# Patient Record
Sex: Male | Born: 1950 | Race: Black or African American | Hispanic: No | Marital: Married | Smoking: Never smoker
Health system: Southern US, Community
[De-identification: ages and names within clinical notes are randomized; demographics above are authoritative.]

## PROBLEM LIST (undated history)

## (undated) DIAGNOSIS — I219 Acute myocardial infarction, unspecified: Secondary | ICD-10-CM

## (undated) DIAGNOSIS — K219 Gastro-esophageal reflux disease without esophagitis: Secondary | ICD-10-CM

## (undated) DIAGNOSIS — I2510 Atherosclerotic heart disease of native coronary artery without angina pectoris: Secondary | ICD-10-CM

## (undated) HISTORY — DX: Gastro-esophageal reflux disease without esophagitis: K21.9

---

## 2006-06-28 ENCOUNTER — Emergency Department (HOSPITAL_COMMUNITY): Admission: EM | Admit: 2006-06-28 | Discharge: 2006-06-28 | Payer: Self-pay | Admitting: Family Medicine

## 2008-02-09 ENCOUNTER — Ambulatory Visit: Payer: Self-pay | Admitting: Internal Medicine

## 2008-02-09 ENCOUNTER — Inpatient Hospital Stay (HOSPITAL_COMMUNITY): Admission: EM | Admit: 2008-02-09 | Discharge: 2008-02-09 | Payer: Self-pay | Admitting: Emergency Medicine

## 2008-04-26 ENCOUNTER — Ambulatory Visit: Payer: Self-pay | Admitting: Internal Medicine

## 2008-04-26 DIAGNOSIS — K219 Gastro-esophageal reflux disease without esophagitis: Secondary | ICD-10-CM | POA: Insufficient documentation

## 2008-04-26 HISTORY — DX: Gastro-esophageal reflux disease without esophagitis: K21.9

## 2008-04-26 LAB — CONVERTED CEMR LAB: PSA: 0.99 ng/mL (ref 0.10–4.00)

## 2008-04-29 ENCOUNTER — Telehealth: Payer: Self-pay | Admitting: Internal Medicine

## 2008-05-05 ENCOUNTER — Telehealth: Payer: Self-pay | Admitting: Internal Medicine

## 2008-05-24 ENCOUNTER — Ambulatory Visit: Payer: Self-pay | Admitting: Gastroenterology

## 2008-06-02 ENCOUNTER — Ambulatory Visit: Payer: Self-pay | Admitting: Internal Medicine

## 2008-06-07 ENCOUNTER — Ambulatory Visit: Payer: Self-pay | Admitting: Gastroenterology

## 2010-11-15 ENCOUNTER — Encounter: Payer: Self-pay | Admitting: Internal Medicine

## 2010-11-16 ENCOUNTER — Ambulatory Visit (INDEPENDENT_AMBULATORY_CARE_PROVIDER_SITE_OTHER): Payer: Self-pay | Admitting: Internal Medicine

## 2010-11-16 ENCOUNTER — Encounter: Payer: Self-pay | Admitting: Internal Medicine

## 2010-11-16 VITALS — BP 128/80 | HR 68 | Temp 98.6°F | Resp 20 | Wt 212.0 lb

## 2010-11-16 DIAGNOSIS — R0602 Shortness of breath: Secondary | ICD-10-CM

## 2010-11-16 NOTE — Patient Instructions (Signed)
Limit your sodium (Salt) intake    It is important that you exercise regularly, at least 20 minutes 3 to 4 times per week.  If you develop chest pain or shortness of breath seek  medical attention.   Stress test as discussed   Return in 6 months for an annual exam

## 2010-11-16 NOTE — Progress Notes (Signed)
  Subjective:    Patient ID: Raymond Norris, male    DOB: Sep 17, 1950, 59 y.o.   MRN: [id number]  HPI   59 year old patient who is seen today with a chief complaint of dyspnea on exertion. This symptom has been present for approximately 6 months. He basically gets very short of breath with sexual activity. He states that otherwise he is very sedentary and this is the only time he experiences dyspnea. This has been present for 6 months but seems to be increasing he states he has sexual activity approximately once per month but has  avoided activity due to his shortness of breath. There is no exertional chest pain;  he has no striking her cardiovascular risk factors although his father's health is unknown   Review of Systems  Constitutional: Negative for fever, chills, appetite change and fatigue.  HENT: Negative for hearing loss, ear pain, congestion, sore throat, trouble swallowing, neck stiffness, dental problem, voice change and tinnitus.   Eyes: Negative for pain, discharge and visual disturbance.  Respiratory: Positive for shortness of breath. Negative for cough, chest tightness, wheezing and stridor.   Cardiovascular: Negative for chest pain, palpitations and leg swelling.  Gastrointestinal: Negative for nausea, vomiting, abdominal pain, diarrhea, constipation, blood in stool and abdominal distention.  Genitourinary: Negative for urgency, hematuria, flank pain, discharge, difficulty urinating and genital sores.  Musculoskeletal: Negative for myalgias, back pain, joint swelling, arthralgias and gait problem.  Skin: Negative for rash.  Neurological: Negative for dizziness, syncope, speech difficulty, weakness, numbness and headaches.  Hematological: Negative for adenopathy. Does not bruise/bleed easily.  Psychiatric/Behavioral: Negative for behavioral problems and dysphoric mood. The patient is not nervous/anxious.        Objective:   Physical Exam  Constitutional: He is oriented to person,  place, and time. He appears well-developed.  HENT:  Head: Normocephalic.  Right Ear: External ear normal.  Left Ear: External ear normal.  Eyes: Conjunctivae and EOM are normal.  Neck: Normal range of motion.  Cardiovascular: Normal rate, regular rhythm, normal heart sounds and intact distal pulses.   Pulmonary/Chest: Breath sounds normal.  Abdominal: Bowel sounds are normal.  Musculoskeletal: Normal range of motion. He exhibits no edema and no tenderness.  Neurological: He is alert and oriented to person, place, and time.  Psychiatric: He has a normal mood and affect. His behavior is normal.          Assessment & Plan:   progressive dyspnea on exertion. Rule out angina equivalent. We'll obtain a Cardiolite stress test for risk stratification. We'll schedule annual exam later this year

## 2010-11-30 ENCOUNTER — Ambulatory Visit (HOSPITAL_COMMUNITY): Payer: BC Managed Care – PPO | Attending: Internal Medicine | Admitting: Radiology

## 2010-11-30 VITALS — Ht 68.0 in | Wt 209.0 lb

## 2010-11-30 DIAGNOSIS — R0602 Shortness of breath: Secondary | ICD-10-CM | POA: Insufficient documentation

## 2010-11-30 DIAGNOSIS — R0989 Other specified symptoms and signs involving the circulatory and respiratory systems: Secondary | ICD-10-CM

## 2010-11-30 DIAGNOSIS — R0609 Other forms of dyspnea: Secondary | ICD-10-CM

## 2010-11-30 MED ORDER — TECHNETIUM TC 99M TETROFOSMIN IV KIT
11.0000 | PACK | Freq: Once | INTRAVENOUS | Status: AC | PRN
  Administered 2010-11-30: 11 via INTRAVENOUS

## 2010-11-30 MED ORDER — TECHNETIUM TC 99M TETROFOSMIN IV KIT
33.0000 | PACK | Freq: Once | INTRAVENOUS | Status: AC | PRN
  Administered 2010-11-30: 33 via INTRAVENOUS

## 2010-11-30 NOTE — Progress Notes (Signed)
[facility] SITE 3 NUCLEAR MED [address] [place] [address] [phone]  Cardiology Nuclear Med Study  Raymond Norris is a 60 y.o. male [id number] 03-Jan-1951   Nuclear Med Background Indication for Stress Test:  Evaluation for Ischemia History:  No documented CAD Cardiac Risk Factors: Family History - CAD, History of Smoking and Obesity  Symptoms:  DOE   Nuclear Pre-Procedure Caffeine/Decaff Intake:  None NPO After: 7:00pm   Lungs:  Clear IV 0.9% NS with Angio Cath:  20g  IV Site: R Antecubital  IV Started by:  Irean Hong, RN  Chest Size (in):  44 Cup Size: n/a  Height: 5\' 8"  (1.727 m)  Weight:  209 lb (94.802 kg)  BMI:  Body mass index is 31.78 kg/(m^2). Tech Comments:  N/A    Nuclear Med Study 1 or 2 day study: 1 day  Stress Test Type:  Stress  Reading MD: Charlton Haws, MD  Order Authorizing Provider:  Eleonore Chiquito, MD  Resting Radionuclide: Technetium 18m Tetrofosmin  Resting Radionuclide Dose: 11 mCi   Stress Radionuclide:  Technetium 63m Tetrofosmin  Stress Radionuclide Dose: 33 mCi           Stress Protocol Rest HR: 48 Stress HR: 142  Rest BP: 132/84 Stress BP: 247/89  Exercise Time (min): 6:15 METS: 7.2   Predicted Max HR: 160 bpm % Max HR: 88.75 bpm Rate Pressure Product: 62952   Dose of Adenosine (mg):  n/a Dose of Lexiscan: n/a mg  Dose of Atropine (mg): n/a Dose of Dobutamine: n/a mcg/kg/min (at max HR)  Stress Test Technologist: [employer], CMA-N  Nuclear Technologist:  Domenic Polite, CNMT     Rest Procedure:  Myocardial perfusion imaging was performed at rest 45 minutes following the intravenous administration of Technetium 52m Tetrofosmin. Rest ECG: Sinus brady with PVC's  Stress Procedure:  The patient exercised for 6:15 on the treadmill utilizing the Bruce protocol.  The patient stopped due to fatigue and denied any chest pain.  There were no diagnostic ST-T wave changes with exercise, but there were  nonspecific T-wave changes in late recovery.  He also had a hypertensive response to exercise, 247/89.  Technetium 46m Tetrofosmin was injected at peak exercise and myocardial perfusion imaging was performed after a brief delay. Stress ECG: No significant change from baseline ECG  QPS Raw Data Images:  Normal; no motion artifact; normal heart/lung ratio. Stress Images:  Normal homogeneous uptake in all areas of the myocardium. Rest Images:  Normal homogeneous uptake in all areas of the myocardium. Subtraction (SDS):  SDS 6 abnormal in the mid anterior wall Transient Ischemic Dilatation (Normal <1.22):  1.00 Lung/Heart Ratio (Normal <0.45):  0.30  Quantitative Gated Spect Images QGS EDV:  134 ml QGS ESV:  73 ml QGS cine images:  Decreased EF with mid and basal anterior wall hypokinesis QGS EF: 46%  Impression Exercise Capacity:  Fair exercise capacity. BP Response:  Normal blood pressure response. Clinical Symptoms:  There is dyspnea. ECG Impression:  No significant ST segment change suggestive of ischemia. Comparison with Prior Nuclear Study: No previous nuclear study performed  Overall Impression:  No ischemia or infarction.  EF depressed with RWMA in mid and basal anterior wall       Charlton Haws

## 2010-12-04 NOTE — Progress Notes (Signed)
ROUTED TO DR. Amador Cunas

## 2010-12-21 ENCOUNTER — Telehealth: Payer: Self-pay | Admitting: *Deleted

## 2010-12-21 NOTE — Telephone Encounter (Signed)
ERROR

## 2010-12-21 NOTE — Telephone Encounter (Addendum)
Wife is asking for lab results.???  LEFT message to call back.

## 2010-12-26 ENCOUNTER — Telehealth: Payer: Self-pay | Admitting: *Deleted

## 2010-12-26 NOTE — Telephone Encounter (Signed)
Spoke with pt - informed of test results. KIK

## 2010-12-26 NOTE — Telephone Encounter (Signed)
Please call and notify that his stress test was normal

## 2010-12-26 NOTE — Telephone Encounter (Signed)
Pt is asking for stress test results, and would like to know today.  Wife states he has been unable to get them?

## 2011-01-09 NOTE — H&P (Signed)
NAME:  Raymond Norris, Raymond Norris NO.:  [id number]   MEDICAL RECORD NO.:  [id number]          PATIENT TYPE:  INP   LOCATION:  3731                         FACILITY:  [facility]   PHYSICIAN:  Therisa Doyne, MD    DATE OF BIRTH:  23-Oct-1950   DATE OF ADMISSION:  02/08/2008  DATE OF DISCHARGE:                              HISTORY & PHYSICAL   PRIMARY CARE Joletta Manner:  Elsworth Soho, MD   CHIEF COMPLAINT:  Nausea and bloating, rule out myocardial infarction.   HISTORY OF PRESENT ILLNESS:  This is a 60 year old African American male  with no significant past medical history and no cardiovascular risk  factors, who presents to the emergency department with 2 days of  bloating, nausea, and indigestion.  These symptoms have been constant  for the past 2 days.  He has tried Gas-X, which has not relieved the  symptoms.  His symptoms are not worsened with exertion.  He denies any  pain.  He does report decreased oral intake over the past 2 days and has  never had symptoms like this before.  He took 2 aspirin this evening  because he was concerned that this could be related to his heart and  came into the emergency department for evaluation.   The patient denies chest pain, headaches, fevers, or chills.  He did  have one episode of shortness of breath, which lasted less than 10  minutes, this occurred at rest.   PAST MEDICAL HISTORY:  None.   SOCIAL HISTORY:  The patient lives in [place].  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  He denies tobacco, alcohol, or drugs.   FAMILY HISTORY:  There is no history of early coronary artery disease.   ALLERGIES:  No known drug allergies.   MEDICATIONS:  None.   REVIEW OF SYSTEMS:  All systems were reviewed and are negative except as  mentioned above in his present illness.   PHYSICAL EXAMINATION:  VITAL SIGNS:  Temperature 98.2, blood pressure  151/92 with a repeat blood pressure of 124/84, pulse 86, respirations  23, and oxygen saturating  97% on room air.  GENERAL:  In no acute distress.  HEENT:  Normocephalic and atraumatic.  The pupils are equal, round, and  reactive to light and accommodation.  Extraocular movements were intact.  Oropharynx reveals moist mucous membranes.  NECK:  Supple.  No lymphadenopathy.  No jugular venous distention or  masses.  CARDIOVASCULAR:  Regular rate and rhythm.  No murmurs, rubs, or gallops.  CHEST:  Clear to auscultation bilaterally.  ABDOMEN:  Positive bowel sounds.  Soft, nontender, and nondistended.  EXTREMITIES: No clubbing, cyanosis, or edema.  BACK:  No CVA tenderness.  SKIN:  No rashes.   LABORATORY DATA:  White blood cell count 10.5, hemoglobin 15.2, and  platelets 346.  Sodium 139, potassium 4.8, chloride 109, and bicarb 20.  BUN 18, creatinine 1.2, and glucose 104.  Total bilirubin elevated at  1.9, alkaline phosphatase 39, AST 23, ALT 15, and lipase 26.   STUDIES:  Chest x-ray showed no acute cardiopulmonary disease.  EKG:  1. EKG from 1224 showed sinus bradycardia with nonspecific ST-T wave      changes in the lateral leads.  2. EKG #2 from 2:29 a.m. again showed sinus bradycardia at the rate of      53 beats per minute, normal intervals, and normal axis.  There are      nonspecific ST-T wave changes, specifically there is less than 1 mm      of ST-segment elevation in leads I and aVL.  There continues to be      nonspecific ST-T wave changes in the lateral leads.  3. EKG #3 from 2:55 a.m. showed sinus bradycardia at 54 beats per      minute with nonspecific ST-T wave changes.   ASSESSMENT AND PLAN:  This is a 60 year old African American male with  no significant past medical history and no cardiovascular risk factors,  who presents with 2 days of bloating and nausea with negative cardiac  enzymes, nonspecific EKG changes, and an elevated total bilirubin of  1.9.   1. We will admit the patient to [facility] to a telemetry unit.   1. Nausea and bloating.   At this time, my suspicion for acute coronary      syndrome is low.  His symptoms are somewhat atypical and have been      constant for 2 days and he has negative cardiac biomarkers.  His      EKG does have some nonspecific ST-T wave changes, and on his second      EKG, there is some mild ST-segment elevation in lead I and aVL;      however, this is less than 1 mm and the patient is currently      asymptomatic at the time of this EKG.  We will rule the patient out      for myocardial infarction with serial cardiac enzymes and monitor      him on telemetry unit.  We will place the patient on aspirin 325 mg      daily.  We will not use a beta-blocker as he is currently      bradycardic.  We will hold off on anticoagulation or 2b/3a      inhibitor unless the patient rules in for myocardial infarction.      Assuming that he rules out, it would be prudent to obtain an      ischemia evaluation prior to discharge.  We will check fasting      lipid profile for risk factor modification.  The etiology of his      nausea and bloating could be related to gallbladder disease given      his hyperbilirubinemia.  We will check a right upper quadrant      ultrasound in the morning to rule out cholelithiasis.  This      evening, we will check a KUB to make sure there are no signs of      obstruction.  Symptomatic treatment with Maalox and GI cocktail as      needed.   1. Fluids, electrolytes, and nutrition:  Normal saline at 75 mL per      hour.  Electrolytes were stable, NPO.   1. Deep venous thrombosis prophylaxis with Lovenox.      Therisa Doyne, MD  Electronically Signed     SJT/MEDQ  D:  02/09/2008  T:  02/09/2008  Job:  [id number]

## 2011-05-14 ENCOUNTER — Other Ambulatory Visit (INDEPENDENT_AMBULATORY_CARE_PROVIDER_SITE_OTHER): Payer: BC Managed Care – PPO

## 2011-05-14 DIAGNOSIS — Z0000 Encounter for general adult medical examination without abnormal findings: Secondary | ICD-10-CM

## 2011-05-14 LAB — CBC WITH DIFFERENTIAL/PLATELET
Basophils Relative: 0.4 % (ref 0.0–3.0)
Eosinophils Absolute: 0.1 10*3/uL (ref 0.0–0.7)
HCT: 47.4 % (ref 39.0–52.0)
Lymphs Abs: 3.1 10*3/uL (ref 0.7–4.0)
MCHC: 32.7 g/dL (ref 30.0–36.0)
MCV: 95.2 fl (ref 78.0–100.0)
Monocytes Absolute: 0.5 10*3/uL (ref 0.1–1.0)
Neutrophils Relative %: 50.2 % (ref 43.0–77.0)
RBC: 4.97 Mil/uL (ref 4.22–5.81)

## 2011-05-14 LAB — POCT URINALYSIS DIPSTICK
Bilirubin, UA: NEGATIVE
Glucose, UA: NEGATIVE
Ketones, UA: NEGATIVE
Nitrite, UA: NEGATIVE
Spec Grav, UA: 1.025
pH, UA: 6

## 2011-05-14 LAB — LIPID PANEL
LDL Cholesterol: 118 mg/dL — ABNORMAL HIGH (ref 0–99)
Total CHOL/HDL Ratio: 4

## 2011-05-14 LAB — BASIC METABOLIC PANEL
BUN: 17 mg/dL (ref 6–23)
CO2: 26 mEq/L (ref 19–32)
Chloride: 109 mEq/L (ref 96–112)
Creatinine, Ser: 1 mg/dL (ref 0.4–1.5)
Potassium: 4.3 mEq/L (ref 3.5–5.1)

## 2011-05-14 LAB — HEPATIC FUNCTION PANEL
Albumin: 4.1 g/dL (ref 3.5–5.2)
Alkaline Phosphatase: 41 U/L (ref 39–117)
Bilirubin, Direct: 0.1 mg/dL (ref 0.0–0.3)

## 2011-05-14 LAB — PSA: PSA: 0.83 ng/mL (ref 0.10–4.00)

## 2011-05-21 ENCOUNTER — Ambulatory Visit (INDEPENDENT_AMBULATORY_CARE_PROVIDER_SITE_OTHER): Payer: BC Managed Care – PPO | Admitting: Internal Medicine

## 2011-05-21 ENCOUNTER — Encounter: Payer: Self-pay | Admitting: Internal Medicine

## 2011-05-21 VITALS — BP 120/80 | HR 70 | Temp 98.2°F | Resp 16 | Ht 68.5 in | Wt 217.0 lb

## 2011-05-21 DIAGNOSIS — Z0000 Encounter for general adult medical examination without abnormal findings: Secondary | ICD-10-CM

## 2011-05-21 DIAGNOSIS — Z23 Encounter for immunization: Secondary | ICD-10-CM

## 2011-05-21 NOTE — Progress Notes (Signed)
Subjective:    Patient ID: Raymond Norris, male    DOB: 07-Dec-1950, 60 y.o.   MRN: [id number]  HPI  60 year old patient who is seen today for a preventive health examination he takes no chronic medications. He was evaluated in the spring with a chief complaint of dyspnea on exertion. He had a Engineer, production medicine stress test that revealed a hypertensive blood pressure response and very mild LV dysfunction. No ischemic changes were noted. He still has mild DOE but seems to be much less of a problem. No new concerns or complaints. He has had a colonoscopy in 2009.   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  HENT: Negative for hearing loss, ear pain, congestion, rhinorrhea, sneezing, mouth sores, trouble swallowing, neck pain, neck stiffness, dental problem, voice change, sinus pressure and tinnitus.   Eyes: Negative for photophobia, pain, redness and visual disturbance.  Respiratory: Negative for apnea, cough, choking, chest tightness, shortness of breath and wheezing.   Cardiovascular: Negative for chest pain, palpitations and leg swelling.  Gastrointestinal: Negative for nausea, vomiting, abdominal pain, diarrhea, constipation, blood in stool, abdominal distention, anal bleeding and rectal pain.  Genitourinary: Negative for dysuria, urgency, frequency, hematuria, flank pain,  decreased urine volume, discharge, penile swelling, scrotal swelling, difficulty urinating, genital sores and testicular pain.  Musculoskeletal: Negative for myalgias, back pain, joint swelling, arthralgias and gait problem.  Skin: Negative for color change, rash and wound.  Neurological: Negative for dizziness, tremors, seizures, syncope, facial asymmetry, speech difficulty, weakness, light-headedness, numbness and headaches.  Hematological: Negative for adenopathy. Does not bruise/bleed easily.  Psychiatric/Behavioral: Negative for suicidal ideas, hallucinations, behavioral problems, confusion, sleep disturbance, self-injury, dysphoric mood, decreased concentration and agitation. The patient is not nervous/anxious.        Objective:   Physical Exam  Constitutional: He appears well-developed and well-nourished. No distress.       130/84 Weight 217  HENT:  Head: Normocephalic and atraumatic.  Right Ear: External ear normal.  Left Ear: External ear normal.  Nose: Nose normal.  Mouth/Throat: Oropharynx is clear and moist.  Eyes: Conjunctivae and EOM are normal. Pupils are equal, round, and reactive to light. No scleral icterus.  Neck: Normal range of motion. Neck supple. No JVD present. No thyromegaly present.  Cardiovascular: Regular rhythm, normal heart sounds and intact distal pulses.  Exam reveals no gallop and no friction rub.   No murmur heard. Pulmonary/Chest: Effort normal and breath sounds normal. He exhibits no tenderness.  Abdominal: Soft. Bowel sounds are normal. He exhibits no distension and no mass. There is no tenderness.  Genitourinary: Prostate normal and penis normal.  Musculoskeletal: Normal range of motion. He exhibits no edema and no tenderness.  Lymphadenopathy:    He has no cervical adenopathy.  Neurological: He is alert. He has normal reflexes. No cranial nerve deficit. Coordination normal.  Skin: Skin is warm and  dry. No rash noted.  Psychiatric: He has a normal  mood and affect. His behavior is normal.          Assessment & Plan:   Preventive health examination. A low salt low calorie diet encouraged regular exercise modest weight loss recommended. He return in one year for followup. Laboratory studies were reviewed. These were unremarkable.

## 2011-05-21 NOTE — Patient Instructions (Signed)
Limit your sodium (Salt) intake    It is important that you exercise regularly, at least 20 minutes 3 to 4 times per week.  If you develop chest pain or shortness of breath seek  medical attention.  Return in one year for follow-up   

## 2011-05-24 LAB — CARDIAC PANEL(CRET KIN+CKTOT+MB+TROPI)
Relative Index: 2.3
Relative Index: 2.3
Total CK: 105
Troponin I: 0.01
Troponin I: <0.01

## 2011-05-24 LAB — COMPREHENSIVE METABOLIC PANEL
ALT: 15
BUN: 18
CO2: 22
Calcium: 9
Creatinine, Ser: 1.24
GFR calc non Af Amer: >60
Glucose, Bld: 104 — ABNORMAL HIGH
Total Protein: 6.7

## 2011-05-24 LAB — TSH: TSH: 1.121

## 2011-05-24 LAB — CBC
HCT: 43.8
Hemoglobin: 15.2
MCHC: 34.8
MCV: 91
RBC: 4.81
RDW: 14.4

## 2011-05-24 LAB — LIPID PANEL
HDL: 27 — ABNORMAL LOW
Triglycerides: 64
VLDL: 13

## 2011-05-24 LAB — POCT CARDIAC MARKERS
CKMB, poc: <1 — ABNORMAL LOW
Operator id: 277751
Troponin i, poc: <0.05

## 2011-05-24 LAB — LIPASE, BLOOD: Lipase: 26

## 2012-05-14 ENCOUNTER — Other Ambulatory Visit: Payer: BC Managed Care – PPO

## 2012-05-21 ENCOUNTER — Encounter: Payer: BC Managed Care – PPO | Admitting: Internal Medicine

## 2012-05-21 ENCOUNTER — Telehealth: Payer: Self-pay

## 2012-05-21 ENCOUNTER — Encounter: Payer: Self-pay | Admitting: Internal Medicine

## 2012-05-21 NOTE — Telephone Encounter (Signed)
Attempt to call for NCNS cpx scheduled 1 yr ago - tried number twice - "the number you have called in incorrect - check the number and try again"

## 2013-03-13 ENCOUNTER — Encounter: Payer: Self-pay | Admitting: *Deleted

## 2013-03-27 ENCOUNTER — Encounter: Payer: Self-pay | Admitting: Gastroenterology

## 2014-07-06 ENCOUNTER — Telehealth: Payer: Self-pay | Admitting: Internal Medicine

## 2014-07-06 NOTE — Telephone Encounter (Signed)
lmom for wife to cb °

## 2014-07-06 NOTE — Telephone Encounter (Signed)
Pt was last seen 2012. Pt would like to re-est. Can I sch?

## 2014-07-06 NOTE — Telephone Encounter (Signed)
ok 

## 2014-07-07 NOTE — Telephone Encounter (Signed)
Pt has been sch

## 2014-07-09 ENCOUNTER — Encounter (HOSPITAL_COMMUNITY): Payer: Self-pay | Admitting: *Deleted

## 2014-07-09 ENCOUNTER — Emergency Department (INDEPENDENT_AMBULATORY_CARE_PROVIDER_SITE_OTHER)
Admission: EM | Admit: 2014-07-09 | Discharge: 2014-07-09 | Disposition: A | Discharge status: Home / Self Care | Payer: Self-pay | Source: Home / Self Care | Attending: Emergency Medicine | Admitting: Emergency Medicine

## 2014-07-09 DIAGNOSIS — R42 Dizziness and giddiness: Secondary | ICD-10-CM

## 2014-07-09 NOTE — Discharge Instructions (Signed)
Dizziness °Dizziness is a common problem. It is a feeling of unsteadiness or light-headedness. You may feel like you are about to faint. Dizziness can lead to injury if you stumble or fall. A person of any age group can suffer from dizziness, but dizziness is more common in older adults. °CAUSES  °Dizziness can be caused by many different things, including: °· Middle ear problems. °· Standing for too long. °· Infections. °· An allergic reaction. °· Aging. °· An emotional response to something, such as the sight of blood. °· Side effects of medicines. °· Tiredness. °· Problems with circulation or blood pressure. °· Excessive use of alcohol or medicines, or illegal drug use. °· Breathing too fast (hyperventilation). °· An irregular heart rhythm (arrhythmia). °· A low red blood cell count (anemia). °· Pregnancy. °· Vomiting, diarrhea, fever, or other illnesses that cause body fluid loss (dehydration). °· Diseases or conditions such as Parkinson's disease, high blood pressure (hypertension), diabetes, and thyroid problems. °· Exposure to extreme heat. °DIAGNOSIS  °Your health care provider will ask about your symptoms, perform a physical exam, and perform an electrocardiogram (ECG) to record the electrical activity of your heart. Your health care provider may also perform other heart or blood tests to determine the cause of your dizziness. These may include: °· Transthoracic echocardiogram (TTE). During echocardiography, sound waves are used to evaluate how blood flows through your heart. °· Transesophageal echocardiogram (TEE). °· Cardiac monitoring. This allows your health care provider to monitor your heart rate and rhythm in real time. °· Holter monitor. This is a portable device that records your heartbeat and can help diagnose heart arrhythmias. It allows your health care provider to track your heart activity for several days if needed. °· Stress tests by exercise or by giving medicine that makes the heart beat  faster. °TREATMENT  °Treatment of dizziness depends on the cause of your symptoms and can vary greatly. °HOME CARE INSTRUCTIONS  °· Drink enough fluids to keep your urine clear or pale yellow. This is especially important in very hot weather. In older adults, it is also important in cold weather. °· Take your medicine exactly as directed if your dizziness is caused by medicines. When taking blood pressure medicines, it is especially important to get up slowly. °· Rise slowly from chairs and steady yourself until you feel okay. °· In the morning, first sit up on the side of the bed. When you feel okay, stand slowly while holding onto something until you know your balance is fine. °· Move your legs often if you need to stand in one place for a long time. Tighten and relax your muscles in your legs while standing. °· Have someone stay with you for 1-2 days if dizziness continues to be a problem. Do this until you feel you are well enough to stay alone. Have the person call your health care provider if he or she notices changes in you that are concerning. °· Do not drive or use heavy machinery if you feel dizzy. °· Do not drink alcohol. °SEEK IMMEDIATE MEDICAL CARE IF:  °· Your dizziness or light-headedness gets worse. °· You feel nauseous or vomit. °· You have problems talking, walking, or using your arms, hands, or legs. °· You feel weak. °· You are not thinking clearly or you have trouble forming sentences. It may take a friend or family member to notice this. °· You have chest pain, abdominal pain, shortness of breath, or sweating. °· Your vision changes. °· You notice   any bleeding.  You have side effects from medicine that seems to be getting worse rather than better. MAKE SURE YOU:   Understand these instructions.  Will watch your condition.  Will get help right away if you are not doing well or get worse. Document Released: 02/06/2001 Document Revised: 08/18/2013 Document Reviewed: 03/02/2011 [facility]  Patient Information 2015 [place], [place]. This information is not intended to replace advice given to you by your health care provider. Make sure you discuss any questions you have with your health care provider.  Near-Syncope Near-syncope (commonly known as near fainting) is sudden weakness, dizziness, or feeling like you might pass out. This can happen when getting up or while standing for a long time. It is caused by a sudden decrease in blood flow to the brain, which can occur for various reasons. Most of the reasons are not serious.  HOME CARE Watch your condition for any changes.  Have someone stay with you until you feel stable.  If you feel like you are going to pass out:  Lie down right away.  Prop your feet up if you can.  Breathe deeply and steadily.  Move only when the feeling has gone away. Most of the time, this feeling lasts only a few minutes. You may feel tired for several hours.  Drink enough fluids to keep your pee (urine) clear or pale yellow.  If you are taking blood pressure or heart medicine, stand up slowly.  Follow up with your doctor as told. GET HELP RIGHT AWAY IF:   You have a severe headache.  You have unusual pain in the chest, belly (abdomen), or back.  You have bleeding from the mouth or butt (rectum), or you have black or tarry poop (stool).  You feel your heart beat differently than normal, or you have a very fast pulse.  You pass out, or you twitch and shake when you pass out.  You pass out when sitting or lying down.  You feel confused.  You have trouble walking.  You are weak.  You have vision problems. MAKE SURE YOU:   Understand these instructions.  Will watch your condition.  Will get help right away if you are not doing well or get worse. Document Released: 01/30/2008 Document Revised: 08/18/2013 Document Reviewed: 01/16/2013 [facility] Patient Information 2015 [place]Care, [place]. This information is not intended to replace  advice given to you by your health care provider. Make sure you discuss any questions you have with your health care provider.

## 2014-07-09 NOTE — ED Provider Notes (Signed)
CSN: [id number]     Arrival date & time 07/09/14  1635 History   First MD Initiated Contact with Patient 07/09/14 1641     Chief Complaint  Patient presents with  . Dizziness   (Consider location/radiation/quality/duration/timing/severity/associated sxs/prior Treatment) HPI Comments: 63 year old male states that 5 days ago he was lying on a creeper (resolved) while working under a car. He lifted his head up and experienced dizziness. After he stood up he felt lightheaded associated with dizziness and then symptoms spontaneously resolved. The whole episode lasted 3-4 minutes. He has been well without similar episodes until today. He was lying on the same creeper in the same position and again while lifting his head he experienced dizziness associated with minor transient headache and nausea. Upon standing up his symptoms abated. He continued to work through the day. And on arrival he is asymptomatic.he denies having associated symptoms with turning of the head, other movements such as bending over. Denies having symptoms while supine during the night and sleeping nor does he have symptoms upon awakening and standing. He has no cardiopulmonary or neurologic diagnoses/past medical history. Is not taking any medications. 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 Other review of systems are negative.   Past Medical History  Diagnosis Date  . GERD 04/26/2008   History reviewed. No pertinent past surgical history. History reviewed. No pertinent family history. History  Substance Use Topics  . Smoking status: Never Smoker   . Smokeless tobacco: Never Used  . Alcohol Use: No    Review of Systems  Constitutional: Negative for fever, chills, diaphoresis, activity change and appetite change.  HENT: Negative.        No hx of head or neck trauma.   Eyes: Negative for pain and visual disturbance.  Respiratory: Negative for cough, choking, chest tightness, shortness of breath and wheezing.    Cardiovascular: Negative for chest pain, palpitations and leg swelling.  Gastrointestinal: Positive for nausea.       Minor brief nausea associated with dizziness  Genitourinary: Negative.   Musculoskeletal: Negative for myalgias, back pain, joint swelling, arthralgias, gait problem, neck pain and neck stiffness.  Skin: Negative.   Neurological: Positive for dizziness. Negative for tremors, seizures, syncope, facial asymmetry, speech difficulty, weakness, numbness and headaches.       No vertigo  Psychiatric/Behavioral: Negative for behavioral problems, dysphoric mood, decreased concentration and agitation. The patient is not nervous/anxious.     Allergies  Review of patient's allergies indicates no known allergies.  Home Medications   Prior to Admission medications   Not on File   BP 135/92 mmHg  Pulse 81  Temp(Src) 98.8 F (37.1 C) (Oral)  Resp 16  SpO2 97% Physical Exam  Constitutional: He is oriented to person, place, and time. He appears well-developed and well-nourished. No distress.  HENT:  Head: Normocephalic and atraumatic.  Right Ear: External ear normal.  Left Ear: External ear normal.  Nose: Nose normal.  Mouth/Throat: Oropharynx is clear and moist. No oropharyngeal exudate.  TM's nl . No hemotympanum or effusion. Soft palate symmetrically. Tongue is midline. Uvula midline. No intraoral swelling or lesions.  Eyes: Conjunctivae and EOM are normal. Pupils are equal, round, and reactive to light. Right eye exhibits no discharge. Left eye exhibits no discharge. No scleral icterus.  Neck: Normal range of motion. Neck supple. No JVD present. No thyromegaly present.  No carotid bruits  Cardiovascular: Normal rate, regular rhythm, normal heart sounds and intact distal pulses.   No  murmur heard. Pulmonary/Chest: Effort normal and breath sounds normal. No respiratory distress. He has no wheezes. He has no rales.  Abdominal: Soft. There is no tenderness.  Musculoskeletal:  Normal range of motion. He exhibits no edema or tenderness.  Lymphadenopathy:    He has no cervical adenopathy.  Neurological: He is alert and oriented to person, place, and time. He has normal reflexes. He displays no tremor. No cranial nerve deficit or sensory deficit. He exhibits normal muscle tone. He displays a negative Romberg sign. He displays no seizure activity. Coordination and gait normal. GCS eye subscore is 4. GCS verbal subscore is 5. GCS motor subscore is 6.  Heel to toe nl   Skin: Skin is warm and dry.  Psychiatric: He has a normal mood and affect.  Nursing note and vitals reviewed.   ED Course  Procedures (including critical care time) Labs Review Labs Reviewed - No data to display  Imaging Review No results found. EKG: NSR. No ectopy, non specific changes T-wave inversion III and early repol in V2 and V3. Nl axis.  MDM   1. Dizziness, nonspecific    Unclear etiology, most likely inner ear. May need further work up to include carotid dopplers. Asymptomatic on arrival and in the Urgent Care. Stable. St feels well. Discussed fed flags and associated sx's and to go to the ED for problems or worsening. F/U with PCP    Hayden Rasmussenavid Kristin Lamagna, NP 07/09/14 1753

## 2014-07-09 NOTE — ED Notes (Signed)
Pt  Reports   Had   Some   dizzyness      And mild  Headache  Earlier  In  The  Week   Bette  Today    -  The  Patient is  Awake  And  Alert  And  Oriented  At   This  Time  -     He  denys  Any  Vomiting      He  Drove  Himself   To  The  Clinic     He is  Speaking in  Complete  sentances  Ambulates  With a  Steady  Fluid  Gait     denys   Any pain at this time    And  His    Skin  Is  Warm  And  Dry

## 2014-07-13 ENCOUNTER — Ambulatory Visit: Payer: Self-pay | Admitting: Internal Medicine

## 2014-07-16 ENCOUNTER — Ambulatory Visit (INDEPENDENT_AMBULATORY_CARE_PROVIDER_SITE_OTHER): Payer: Self-pay | Admitting: Internal Medicine

## 2014-07-16 ENCOUNTER — Encounter: Payer: Self-pay | Admitting: Internal Medicine

## 2014-07-16 VITALS — BP 110/64 | HR 58 | Temp 99.0°F | Wt 218.0 lb

## 2014-07-16 DIAGNOSIS — K219 Gastro-esophageal reflux disease without esophagitis: Secondary | ICD-10-CM

## 2014-07-16 DIAGNOSIS — R42 Dizziness and giddiness: Secondary | ICD-10-CM

## 2014-07-16 NOTE — Progress Notes (Signed)
   Subjective:    Patient ID: Raymond Norris, male    DOB: 02/07/1951, 63 y.o.   MRN: [id number]  HPI  63 year old patient who is seen in follow-up.  He was seen in the ED about 1 week ago with dizziness.  He describes dizziness with head turning, especially when he was on his back and in awkward positions while working on his car.  Symptoms are very mild and self-limiting.  Denies any true vertigo or nausea.  He takes no medications.  He is scheduled for a physical next month  Past Medical History  Diagnosis Date  . GERD 04/26/2008    History   Social History  . Marital Status: Married    Spouse Name: N/A    Number of Children: N/A  . Years of Education: N/A   Occupational History  . Not on file.   Social History Main Topics  . Smoking status: Never Smoker   . Smokeless tobacco: Never Used  . Alcohol Use: No  . Drug Use: No  . Sexual Activity: Not on file   Other Topics Concern  . Not on file   Social History Narrative    No past surgical history on file.  No family history on file.  No Known Allergies  No current outpatient prescriptions on file prior to visit.   No current facility-administered medications on file prior to visit.    BP 110/64 mmHg  Pulse 58  Temp(Src) 99 F (37.2 C) (Oral)  Wt 218 lb (98.884 kg)     Review of Systems  Constitutional: Negative for fever, chills, appetite change and fatigue.  HENT: Negative for congestion, dental problem, ear pain, hearing loss, sore throat, tinnitus, trouble swallowing and voice change.   Eyes: Negative for pain, discharge and visual disturbance.  Respiratory: Negative for cough, chest tightness, wheezing and stridor.   Cardiovascular: Negative for chest pain, palpitations and leg swelling.  Gastrointestinal: Negative for nausea, vomiting, abdominal pain, diarrhea, constipation, blood in stool and abdominal distention.  Genitourinary: Negative for urgency, hematuria, flank pain, discharge, difficulty  urinating and genital sores.  Musculoskeletal: Negative for myalgias, back pain, joint swelling, arthralgias, gait problem and neck stiffness.  Skin: Negative for rash.  Neurological: Positive for dizziness and light-headedness. Negative for syncope, speech difficulty, weakness, numbness and headaches.  Hematological: Negative for adenopathy. Does not bruise/bleed easily.  Psychiatric/Behavioral: Negative for behavioral problems and dysphoric mood. The patient is not nervous/anxious.        Objective:   Physical Exam  Constitutional: He is oriented to person, place, and time. He appears well-developed.  Blood pressure 110/64 on arrival Repeat blood pressure 130/80  HENT:  Head: Normocephalic.  Right Ear: External ear normal.  Left Ear: External ear normal.  Eyes: Conjunctivae and EOM are normal.  Neck: Normal range of motion.  Cardiovascular: Normal rate and normal heart sounds.   Pulmonary/Chest: Breath sounds normal.  Abdominal: Bowel sounds are normal.  Musculoskeletal: Normal range of motion. He exhibits no edema or tenderness.  Neurological: He is alert and oriented to person, place, and time. No cranial nerve deficit. Coordination normal.  No drift Normal finger to nose testing  Psychiatric: He has a normal mood and affect. His behavior is normal.          Assessment & Plan:   Nonspecific dizziness.  Probable mild vertigo symptoms are mild and seem to be improving.  He is scheduled for a annual exam in 1 month.  We'll reassess at that time

## 2014-07-16 NOTE — Progress Notes (Signed)
Pre visit review using our clinic review tool, if applicable. No additional management support is needed unless otherwise documented below in the visit note. 

## 2014-07-16 NOTE — Patient Instructions (Signed)
Return for your annual exam next month as scheduled  Call or return to clinic prn if these symptoms worsen or fail to improve as anticipated.  Vertigo Vertigo means you feel like you or your surroundings are moving when they are not. Vertigo can be dangerous if it occurs when you are at work, driving, or performing difficult activities.  CAUSES  Vertigo occurs when there is a conflict of signals sent to your brain from the visual and sensory systems in your body. There are many different causes of vertigo, including:  Infections, especially in the inner ear.  A bad reaction to a drug or misuse of alcohol and medicines.  Withdrawal from drugs or alcohol.  Rapidly changing positions, such as lying down or rolling over in bed.  A migraine headache.  Decreased blood flow to the brain.  Increased pressure in the brain from a head injury, infection, tumor, or bleeding. SYMPTOMS  You may feel as though the world is spinning around or you are falling to the ground. Because your balance is upset, vertigo can cause nausea and vomiting. You may have involuntary eye movements (nystagmus). DIAGNOSIS  Vertigo is usually diagnosed by physical exam. If the cause of your vertigo is unknown, your caregiver may perform imaging tests, such as an MRI scan (magnetic resonance imaging). TREATMENT  Most cases of vertigo resolve on their own, without treatment. Depending on the cause, your caregiver may prescribe certain medicines. If your vertigo is related to body position issues, your caregiver may recommend movements or procedures to correct the problem. In rare cases, if your vertigo is caused by certain inner ear problems, you may need surgery. HOME CARE INSTRUCTIONS   Follow your caregiver's instructions.  Avoid driving.  Avoid operating heavy machinery.  Avoid performing any tasks that would be dangerous to you or others during a vertigo episode.  Tell your caregiver if you notice that certain  medicines seem to be causing your vertigo. Some of the medicines used to treat vertigo episodes can actually make them worse in some people. SEEK IMMEDIATE MEDICAL CARE IF:   Your medicines do not relieve your vertigo or are making it worse.  You develop problems with talking, walking, weakness, or using your arms, hands, or legs.  You develop severe headaches.  Your nausea or vomiting continues or gets worse.  You develop visual changes.  A family member notices behavioral changes.  Your condition gets worse. MAKE SURE YOU:  Understand these instructions.  Will watch your condition.  Will get help right away if you are not doing well or get worse. Document Released: 05/23/2005 Document Revised: 11/05/2011 Document Reviewed: 03/01/2011 [facility] Patient Information 2015 [employer] [place]. This information is not intended to replace advice given to you by your health care provider. Make sure you discuss any questions you have with your health care provider.

## 2014-08-18 ENCOUNTER — Ambulatory Visit: Payer: BC Managed Care – PPO | Admitting: Internal Medicine

## 2015-01-26 HISTORY — PX: PERCUTANEOUS CORONARY STENT INTERVENTION (PCI-S): SHX6016

## 2015-01-31 ENCOUNTER — Emergency Department (HOSPITAL_COMMUNITY): Payer: No Typology Code available for payment source

## 2015-01-31 ENCOUNTER — Inpatient Hospital Stay (HOSPITAL_COMMUNITY)
Admission: EM | Admit: 2015-01-31 | Discharge: 2015-02-01 | DRG: 247 | Disposition: A | Discharge status: Home / Self Care | Payer: No Typology Code available for payment source | Attending: Cardiovascular Disease | Admitting: Cardiovascular Disease

## 2015-01-31 ENCOUNTER — Encounter (HOSPITAL_COMMUNITY): Payer: Self-pay

## 2015-01-31 ENCOUNTER — Encounter (HOSPITAL_COMMUNITY)
Admission: EM | Disposition: A | Discharge status: Home / Self Care | Payer: No Typology Code available for payment source | Source: Home / Self Care | Attending: Cardiovascular Disease

## 2015-01-31 ENCOUNTER — Telehealth: Payer: Self-pay | Admitting: Internal Medicine

## 2015-01-31 DIAGNOSIS — I25110 Atherosclerotic heart disease of native coronary artery with unstable angina pectoris: Secondary | ICD-10-CM | POA: Diagnosis present

## 2015-01-31 DIAGNOSIS — I200 Unstable angina: Secondary | ICD-10-CM | POA: Diagnosis present

## 2015-01-31 DIAGNOSIS — I2510 Atherosclerotic heart disease of native coronary artery without angina pectoris: Secondary | ICD-10-CM | POA: Diagnosis present

## 2015-01-31 DIAGNOSIS — Z87891 Personal history of nicotine dependence: Secondary | ICD-10-CM

## 2015-01-31 DIAGNOSIS — I214 Non-ST elevation (NSTEMI) myocardial infarction: Secondary | ICD-10-CM | POA: Diagnosis not present

## 2015-01-31 DIAGNOSIS — E785 Hyperlipidemia, unspecified: Secondary | ICD-10-CM | POA: Diagnosis present

## 2015-01-31 DIAGNOSIS — I10 Essential (primary) hypertension: Secondary | ICD-10-CM | POA: Diagnosis present

## 2015-01-31 DIAGNOSIS — R079 Chest pain, unspecified: Secondary | ICD-10-CM | POA: Diagnosis present

## 2015-01-31 HISTORY — DX: Acute myocardial infarction, unspecified: I21.9

## 2015-01-31 HISTORY — PX: CARDIAC CATHETERIZATION: SHX172

## 2015-01-31 HISTORY — DX: Atherosclerotic heart disease of native coronary artery without angina pectoris: I25.10

## 2015-01-31 LAB — CBC
HCT: 46.6 % (ref 39.0–52.0)
Hemoglobin: 15.5 g/dL (ref 13.0–17.0)
MCH: 30.8 pg (ref 26.0–34.0)
MCHC: 33.3 g/dL (ref 30.0–36.0)
MCV: 92.5 fL (ref 78.0–100.0)
Platelets: 355 10*3/uL (ref 150–400)
RBC: 5.04 MIL/uL (ref 4.22–5.81)
RDW: 14.4 % (ref 11.5–15.5)
WBC: 11.7 10*3/uL — ABNORMAL HIGH (ref 4.0–10.5)

## 2015-01-31 LAB — TROPONIN I
Troponin I: 1.12 ng/mL (ref <0.031)
Troponin I: 8.49 ng/mL (ref <0.031)

## 2015-01-31 LAB — BASIC METABOLIC PANEL
Anion gap: 8 (ref 5–15)
BUN: 12 mg/dL (ref 6–20)
CHLORIDE: 110 mmol/L (ref 101–111)
CO2: 21 mmol/L — AB (ref 22–32)
CREATININE: 1.26 mg/dL — AB (ref 0.61–1.24)
Calcium: 8.9 mg/dL (ref 8.9–10.3)
GFR calc Af Amer: >60 mL/min (ref >60)
GFR calc non Af Amer: 59 mL/min — ABNORMAL LOW (ref >60)
GLUCOSE: 109 mg/dL — AB (ref 65–99)
POTASSIUM: 3.6 mmol/L (ref 3.5–5.1)
Sodium: 139 mmol/L (ref 135–145)

## 2015-01-31 LAB — TSH: TSH: 4.611 u[IU]/mL — ABNORMAL HIGH (ref 0.350–4.500)

## 2015-01-31 LAB — I-STAT TROPONIN, ED: Troponin i, poc: 0.28 ng/mL (ref 0.00–0.08)

## 2015-01-31 SURGERY — LEFT HEART CATH AND CORONARY ANGIOGRAPHY
Anesthesia: LOCAL

## 2015-01-31 MED ORDER — SODIUM CHLORIDE 0.9 % IJ SOLN: 3.0000 mL | INTRAMUSCULAR | Status: DC | PRN

## 2015-01-31 MED ORDER — LIDOCAINE HCL (PF) 1 % IJ SOLN
INTRAMUSCULAR | Status: AC
  Filled 2015-01-31: qty 30

## 2015-01-31 MED ORDER — VERAPAMIL HCL 2.5 MG/ML IV SOLN
INTRAVENOUS | Status: DC | PRN
  Administered 2015-01-31: 16:00:00 via INTRA_ARTERIAL

## 2015-01-31 MED ORDER — ACETAMINOPHEN 325 MG PO TABS: 650.0000 mg | ORAL_TABLET | ORAL | Status: DC | PRN

## 2015-01-31 MED ORDER — ONDANSETRON HCL 4 MG/2ML IJ SOLN: 4.0000 mg | Freq: Four times a day (QID) | INTRAMUSCULAR | Status: DC | PRN

## 2015-01-31 MED ORDER — VERAPAMIL HCL 2.5 MG/ML IV SOLN
INTRAVENOUS | Status: AC
  Filled 2015-01-31: qty 2

## 2015-01-31 MED ORDER — HEPARIN BOLUS VIA INFUSION
4000.0000 [IU] | Freq: Once | INTRAVENOUS | Status: AC
  Administered 2015-01-31: 4000 [IU] via INTRAVENOUS
  Filled 2015-01-31: qty 4000

## 2015-01-31 MED ORDER — TICAGRELOR 90 MG PO TABS
ORAL_TABLET | ORAL | Status: AC
  Filled 2015-01-31: qty 1

## 2015-01-31 MED ORDER — SODIUM CHLORIDE 0.9 % IJ SOLN: 3.0000 mL | Freq: Two times a day (BID) | INTRAMUSCULAR | Status: DC

## 2015-01-31 MED ORDER — TICAGRELOR 90 MG PO TABS
ORAL_TABLET | ORAL | Status: DC | PRN
  Administered 2015-01-31: 180 mg via ORAL

## 2015-01-31 MED ORDER — MIDAZOLAM HCL 2 MG/2ML IJ SOLN
INTRAMUSCULAR | Status: AC
  Filled 2015-01-31: qty 2

## 2015-01-31 MED ORDER — NITROGLYCERIN 1 MG/10 ML FOR IR/CATH LAB
INTRA_ARTERIAL | Status: AC
  Filled 2015-01-31: qty 10

## 2015-01-31 MED ORDER — ATORVASTATIN CALCIUM 80 MG PO TABS: 80.0000 mg | ORAL_TABLET | Freq: Every day | ORAL | Status: DC

## 2015-01-31 MED ORDER — HEPARIN SODIUM (PORCINE) 1000 UNIT/ML IJ SOLN
INTRAMUSCULAR | Status: AC
  Filled 2015-01-31: qty 1

## 2015-01-31 MED ORDER — METOPROLOL TARTRATE 12.5 MG HALF TABLET
12.5000 mg | ORAL_TABLET | Freq: Two times a day (BID) | ORAL | Status: DC
  Administered 2015-02-01: 12.5 mg via ORAL
  Filled 2015-01-31: qty 1

## 2015-01-31 MED ORDER — IOHEXOL 350 MG/ML SOLN
INTRAVENOUS | Status: DC | PRN
  Administered 2015-01-31: 265 mL via INTRAVENOUS

## 2015-01-31 MED ORDER — ASPIRIN 81 MG PO CHEW
324.0000 mg | CHEWABLE_TABLET | Freq: Once | ORAL | Status: AC
  Administered 2015-01-31: 324 mg via ORAL
  Filled 2015-01-31: qty 4

## 2015-01-31 MED ORDER — OXYCODONE-ACETAMINOPHEN 5-325 MG PO TABS: 1.0000 | ORAL_TABLET | ORAL | Status: DC | PRN

## 2015-01-31 MED ORDER — FENTANYL CITRATE (PF) 100 MCG/2ML IJ SOLN
INTRAMUSCULAR | Status: DC | PRN
  Administered 2015-01-31 (×2): 25 ug via INTRAVENOUS
  Administered 2015-01-31: 50 ug via INTRAVENOUS
  Administered 2015-01-31: 25 ug via INTRAVENOUS

## 2015-01-31 MED ORDER — SODIUM CHLORIDE 0.9 % IV SOLN
250.0000 mL | INTRAVENOUS | Status: DC | PRN
Start: 2015-01-31 — End: 2015-02-01

## 2015-01-31 MED ORDER — FENTANYL CITRATE (PF) 100 MCG/2ML IJ SOLN
INTRAMUSCULAR | Status: AC
  Filled 2015-01-31: qty 2

## 2015-01-31 MED ORDER — HEPARIN (PORCINE) IN NACL 2-0.9 UNIT/ML-% IJ SOLN
INTRAMUSCULAR | Status: AC
  Filled 2015-01-31: qty 1000

## 2015-01-31 MED ORDER — HEPARIN SODIUM (PORCINE) 1000 UNIT/ML IJ SOLN
INTRAMUSCULAR | Status: DC | PRN
  Administered 2015-01-31 (×2): 5000 [IU] via INTRAVENOUS
  Administered 2015-01-31: 2000 [IU] via INTRAVENOUS

## 2015-01-31 MED ORDER — NITROGLYCERIN IN D5W 200-5 MCG/ML-% IV SOLN
2.0000 ug/min | INTRAVENOUS | Status: DC
  Administered 2015-01-31: 5 ug/min via INTRAVENOUS
  Filled 2015-01-31: qty 250

## 2015-01-31 MED ORDER — MIDAZOLAM HCL 2 MG/2ML IJ SOLN
INTRAMUSCULAR | Status: DC | PRN
  Administered 2015-01-31: 2 mg via INTRAVENOUS
  Administered 2015-01-31 (×2): 1 mg via INTRAVENOUS
  Administered 2015-01-31: 2 mg via INTRAVENOUS

## 2015-01-31 MED ORDER — ASPIRIN 81 MG PO CHEW
81.0000 mg | CHEWABLE_TABLET | Freq: Every day | ORAL | Status: DC
  Administered 2015-02-01: 81 mg via ORAL
  Filled 2015-01-31: qty 1

## 2015-01-31 MED ORDER — FENTANYL CITRATE (PF) 100 MCG/2ML IJ SOLN
100.0000 ug | Freq: Once | INTRAMUSCULAR | Status: AC
  Administered 2015-01-31: 100 ug via INTRAVENOUS
  Filled 2015-01-31: qty 2

## 2015-01-31 MED ORDER — TICAGRELOR 90 MG PO TABS
90.0000 mg | ORAL_TABLET | Freq: Two times a day (BID) | ORAL | Status: DC
  Administered 2015-02-01: 06:00:00 90 mg via ORAL
  Filled 2015-01-31: qty 1

## 2015-01-31 MED ORDER — NITROGLYCERIN 1 MG/10 ML FOR IR/CATH LAB
INTRA_ARTERIAL | Status: DC | PRN
  Administered 2015-01-31 (×3): 200 ug via INTRACORONARY
  Administered 2015-01-31 (×2): 150 ug via INTRACORONARY

## 2015-01-31 MED ORDER — SODIUM CHLORIDE 0.9 % WEIGHT BASED INFUSION
3.0000 mL/kg/h | INTRAVENOUS | Status: AC
  Administered 2015-01-31: 3 mL/kg/h via INTRAVENOUS

## 2015-01-31 MED ORDER — HEPARIN (PORCINE) IN NACL 100-0.45 UNIT/ML-% IJ SOLN
1300.0000 [IU]/h | INTRAMUSCULAR | Status: DC
  Administered 2015-01-31: 1300 [IU]/h via INTRAVENOUS
  Filled 2015-01-31 (×2): qty 250

## 2015-01-31 MED ORDER — NITROGLYCERIN 0.4 MG SL SUBL
0.4000 mg | SUBLINGUAL_TABLET | SUBLINGUAL | Status: DC | PRN
  Administered 2015-01-31: 0.4 mg via SUBLINGUAL
  Filled 2015-01-31: qty 1

## 2015-01-31 MED ORDER — ASPIRIN 81 MG PO TABS: 81.0000 mg | ORAL_TABLET | Freq: Every day | ORAL | Status: DC

## 2015-01-31 SURGICAL SUPPLY — 25 items
BALLN EUPHORA RX 2.5X10 (BALLOONS) ×2
BALLN TREK RX 2.5X15 (BALLOONS) ×2
BALLN ~~LOC~~ TREK RX 3.0X8 (BALLOONS) ×2
BALLOON EUPHORA RX 2.5X10 (BALLOONS) ×1 IMPLANT
BALLOON TREK RX 2.5X15 (BALLOONS) ×1 IMPLANT
BALLOON ~~LOC~~ TREK RX 3.0X8 (BALLOONS) ×1 IMPLANT
CATH INFINITI 5 FR JL3.5 (CATHETERS) ×2 IMPLANT
CATH INFINITI 5FR ANG PIGTAIL (CATHETERS) ×2 IMPLANT
CATH INFINITI 5FR MULTPACK ANG (CATHETERS) IMPLANT
CATH INFINITI JR4 5F (CATHETERS) ×2 IMPLANT
CATH VISTA GUIDE 6FR XB3 (CATHETERS) ×2 IMPLANT
DEVICE RAD COMP TR BAND LRG (VASCULAR PRODUCTS) ×2 IMPLANT
GLIDESHEATH SLEND SS 6F .021 (SHEATH) ×2 IMPLANT
KIT ENCORE 26 ADVANTAGE (KITS) ×2 IMPLANT
KIT HEART LEFT (KITS) ×2 IMPLANT
PACK CARDIAC CATHETERIZATION (CUSTOM PROCEDURE TRAY) ×2 IMPLANT
SHEATH PINNACLE 5F 10CM (SHEATH) IMPLANT
STENT PROMUS PREM MR 2.5X16 (Permanent Stent) ×2 IMPLANT
STENT PROMUS PREM MR 2.75X12 (Permanent Stent) ×2 IMPLANT
SYR MEDRAD MARK V 150ML (SYRINGE) ×2 IMPLANT
TRANSDUCER W/STOPCOCK (MISCELLANEOUS) ×2 IMPLANT
TUBING CIL FLEX 10 FLL-RA (TUBING) ×2 IMPLANT
WIRE COUGAR XT STRL 190CM (WIRE) ×4 IMPLANT
WIRE EMERALD 3MM-J .035X150CM (WIRE) IMPLANT
WIRE SAFE-T 1.5MM-J .035X260CM (WIRE) ×2 IMPLANT

## 2015-01-31 NOTE — Progress Notes (Signed)
ANTICOAGULATION CONSULT NOTE - Initial Consult  Pharmacy Consult:  Heparin Indication: chest pain/ACS  No Known Allergies  Patient Measurements: Height: 5' 8.5" (174 cm) Weight: 220 lb (99.791 kg) IBW/kg (Calculated) : 69.55 Heparin Dosing Weight: 91 kg  Vital Signs: Temp: 98.8 F (37.1 C) (06/06 0934) Temp Source: Oral (06/06 0934) BP: 150/95 mmHg (06/06 1000) Pulse Rate: 56 (06/06 1000)  Labs:  Recent Labs  01/31/15 0946  HGB 15.5  HCT 46.6  PLT 355    CrCl cannot be calculated (Patient has no serum creatinine result on file.).   Medical History: Past Medical History  Diagnosis Date  . GERD 04/26/2008       Assessment: Raymond Norris presented with chest pain and Pharmacy consulted to initiate IV heparin for ACS.  Baseline labs reviewed.   Goal of Therapy:  Heparin level 0.3-0.7 units/ml Monitor platelets by anticoagulation protocol: Yes    Plan:  - Heparin 4000 units IV bolus x 1, then - Heparin gtt at 1300 units/hr - Check 6 hr HL - Daily HL / CBC    Joyelle Siedlecki D. Laney Potashang, PharmD, BCPS Pager:  [phone] 01/31/2015, 10:39 AM

## 2015-01-31 NOTE — Telephone Encounter (Signed)
[facility] - Client TELEPHONE ADVICE RECORD [facility] Patient Name: Iona BeardWALTER Revolorio DOB: 09/12/1950 Initial Comment Caller states her husband is having chest pain, and moved to his right arm. Nurse Assessment Nurse: Chrys RacerKoenig, RN, Alexia FreestoneAnna Marie Date/Time Lamount Cohen(Eastern Time): 01/31/2015 8:55:31 AM Confirm and document reason for call. If symptomatic, describe symptoms. ---Caller states her husband is having chest pain, and moved to his right arm./jaw. Pt does not have NTG pills/paste. Pt did take his daily baby ASA this morning. Pt is currently at work and his wife is calling for him while she has him on her cell phone Has the patient traveled out of the country within the last 30 days? ---No Does the patient require triage? ---Yes Related visit to physician within the last 2 weeks? ---No Does the PT have any chronic conditions? (i.e. diabetes, asthma, etc.) ---Unknown Guidelines Guideline Title Affirmed Question Affirmed Notes Chest Pain Pain also present in shoulder(s) or arm(s) or jaw (Exception: pain is clearly made worse by movement) Final Disposition User Go to ED Now Chrys RacerKoenig, RN, Alexia FreestoneAnna Marie

## 2015-01-31 NOTE — ED Notes (Signed)
Pt. Complaint of new central CP with some radiation to L arm. Denies SOB or associated symptoms. States pain started 2 weeks ago and was worse last night. Pain 5/10 at this time. Pt. AxO x4. Pt. Ambulatory.

## 2015-01-31 NOTE — Progress Notes (Signed)
UR COMPLETED  

## 2015-01-31 NOTE — H&P (Signed)
PCP: Nyoka Cowden, MD Cardiologist:  Reola Calkins  Raymond Norris is an 64 y.o. male.   Chief Complaint: Chest pain HPI:   The patient is a 64 yo male with a history of GERD and remote tobacco(quit over 25 years ago).  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  EF was 46%.  He presents today with chest pain across his chest, which started about two weeks ago, and has been off and on.  At worst it was 2/10 but he did notice it radiated to his neck and left arm.  He denies nausea, vomiting, fever, shortness of breath, orthopnea, dizziness, PND, cough, congestion, abdominal pain, hematochezia, melena, lower extremity edema, claudication.  Deep inspiration does not make it worse.  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  He also has had a lot of gas and thought it could be related to that.  He has not seen his PCP in four years because he only goes if he has a problem.  No family history of CAD.    Past Medical History  Diagnosis Date  . GERD 04/26/2008    History reviewed. No pertinent past surgical history.  No family history on file. Social History:  reports that he has never smoked. He has never used smokeless tobacco. He reports that he does not drink alcohol or use illicit drugs.  Allergies: No Known Allergies   (Not in a hospital admission)  Results for orders placed or performed during the hospital encounter of 01/31/15 (from the past 48 hour(s))  CBC     Status: Abnormal   Collection Time: 01/31/15  9:46 AM  Result Value Ref Range   WBC 11.7 (H) 4.0 - 10.5 K/uL   RBC 5.04 4.22 - 5.81 MIL/uL   Hemoglobin 15.5 13.0 - 17.0 g/dL   HCT 46.6 39.0 - 52.0 %   MCV 92.5 78.0 - 100.0 fL   MCH 30.8 26.0 - 34.0 pg   MCHC 33.3 30.0 - 36.0 g/dL   RDW 14.4 11.5 - 15.5 %   Platelets 355 150 - 400 K/uL  Basic metabolic panel     Status: Abnormal   Collection Time: 01/31/15  9:46 AM  Result Value Ref Range   Sodium 139  135 - 145 mmol/L   Potassium 3.6 3.5 - 5.1 mmol/L   Chloride 110 101 - 111 mmol/L   CO2 21 (L) 22 - 32 mmol/L   Glucose, Bld 109 (H) 65 - 99 mg/dL   BUN 12 6 - 20 mg/dL   Creatinine, Ser 1.26 (H) 0.61 - 1.24 mg/dL   Calcium 8.9 8.9 - 10.3 mg/dL   GFR calc non Af Amer 59 (L) >60 mL/min   GFR calc Af Amer >60 >60 mL/min    Comment: (NOTE) The eGFR has been calculated using the CKD EPI equation. This calculation has not been validated in all clinical situations. eGFR's persistently <60 mL/min signify possible Chronic Kidney Disease.    Anion gap 8 5 - 15  I-stat troponin, ED  (not at [facility], [facility])     Status: Abnormal   Collection Time: 01/31/15 10:01 AM  Result Value Ref Range   Troponin i, poc 0.28 (HH) 0.00 - 0.08 ng/mL   Comment NOTIFIED PHYSICIAN    Comment 3            Comment: Due to the release kinetics of cTnI, a negative result within  the first hours of the onset of symptoms does not rule out myocardial infarction with certainty. If myocardial infarction is still suspected, repeat the test at appropriate intervals.    Dg Chest Port 1 View  01/31/2015   CLINICAL DATA:  Chest pain radiating into left arm.  EXAM: PORTABLE CHEST - 1 VIEW  COMPARISON:  02/08/2008  FINDINGS: Lung volumes are low bilaterally. There is no evidence of pulmonary edema, consolidation, pneumothorax, nodule or pleural fluid. The heart size is at the upper limits of normal. Other mediastinal contours appear normal.  IMPRESSION: Low lung volumes.  No acute findings.   Electronically Signed   By: Aletta Edouard M.D.   On: 01/31/2015 10:04    Review of Systems  Constitutional: Positive for chills. Negative for fever and diaphoresis.  HENT: Negative for congestion and sore throat.   Respiratory: Negative for cough and shortness of breath.   Cardiovascular: Positive for chest pain. Negative for palpitations, orthopnea, leg swelling and PND.  Gastrointestinal: Negative for nausea, vomiting, abdominal pain,  blood in stool and melena.  Musculoskeletal: Negative for myalgias.  Neurological: Negative for dizziness.  All other systems reviewed and are negative.   Blood pressure 152/82, pulse 50, temperature 98.8 F (37.1 C), temperature source Oral, resp. rate 16, height 5' 8.5" (1.74 m), weight 220 lb (99.791 kg), SpO2 97 %. Physical Exam  Nursing note and vitals reviewed. Constitutional: He is oriented to person, place, and time. He appears well-developed and well-nourished. No distress.  HENT:  Head: Normocephalic and atraumatic.  Mouth/Throat: No oropharyngeal exudate.  Eyes: EOM are normal. Pupils are equal, round, and reactive to light. No scleral icterus.  Neck: Normal range of motion. Neck supple.  Cardiovascular: Normal rate, regular rhythm, S1 normal and S2 normal.  Exam reveals no gallop.   No murmur heard. Pulses:      Radial pulses are 2+ on the right side, and 2+ on the left side.       Dorsalis pedis pulses are 2+ on the right side, and 2+ on the left side.  No carotid bruit  Respiratory: Effort normal and breath sounds normal. He has no wheezes. He has no rales.  GI: Soft. Bowel sounds are normal. He exhibits no distension.  Musculoskeletal: He exhibits no edema.  Lymphadenopathy:    He has no cervical adenopathy.  Neurological: He is alert and oriented to person, place, and time. He exhibits normal muscle tone.  Skin: Skin is warm and dry.  Psychiatric: He has a normal mood and affect.     Assessment/Plan Principal Problem:   NSTEMI (non-ST elevated myocardial infarction) Active Problems:   Unstable angina   Benign essential HTN  Plan:  Admit to telemetry floor.  Initial troponin is 0.28.   Pharmacy has started heparin.  He is pain free now.  Add beta blocker and statin. Continue 43m ASA.  Check lipids, A1C, TSH.  Left heart cath today or tomorrow via radial approach.  Risk and benefits discussed.    HTarri Fuller PWheatland6/01/2015, 2:32 PM  As above, patient seen  and examined. Briefly he is a 64year old male with a past medical history of gastroesophageal reflux disease presenting with myocardial infarction. The patient has had intermittent chest pain for approximately 2 weeks. The pain is diffuse radiating to his back and left upper extremity. No associated symptoms. The pain has been recurring and he presented for further evaluation. He is having continued pain during evaluation. Electrocardiogram shows normal sinus rhythm, inferior T-wave inversion  and subtle lateral ST elevation. Troponin I 1.12. Plan to admit and cycle enzymes. Treat with aspirin, heparin, nitroglycerin and statin. Patient's heart rate is in the 40s and 50s and we will therefore not add a beta blocker at this point. Given persistence of symptoms and subtle lateral ST elevation plan to proceed with urgent catheterization. The risks and benefits were discussed and he agrees to proceed. Kirk Ruths

## 2015-01-31 NOTE — Telephone Encounter (Signed)
Patient went to [facility] ED ?

## 2015-01-31 NOTE — Progress Notes (Signed)
TR BAND REMOVAL  LOCATION:    right radial  DEFLATED PER PROTOCOL:    Yes.    TIME BAND OFF / DRESSING APPLIED:    22:00   SITE UPON ARRIVAL:    Level 0  SITE AFTER BAND REMOVAL:    Level 0  REVERSE ALLEN'S TEST:     positive  CIRCULATION SENSATION AND MOVEMENT:    Within Normal Limits   Yes.    COMMENTS:    

## 2015-01-31 NOTE — Interval H&P Note (Signed)
History and Physical Interval Note:  01/31/2015 3:45 PM  Raymond Norris  has presented today for surgery, with the diagnosis of active cp, positive troponins  The various methods of treatment have been discussed with the patient and family. After consideration of risks, benefits and other options for treatment, the patient has consented to  Procedure(s): Left Heart Cath and Coronary Angiography (N/A) as a surgical intervention .  The patient's history has been reviewed, patient examined, no change in status, stable for surgery.  I have reviewed the patient's chart and labs.  Questions were answered to the patient's satisfaction.    Cath Lab Visit (complete for each Cath Lab visit)  Clinical Evaluation Leading to the Procedure:   ACS: Yes.    Non-ACS:    Anginal Classification: CCS IV  Anti-ischemic medical therapy: No Therapy  Non-Invasive Test Results: No non-invasive testing performed  Prior CABG: No previous CABG       Tonny Bollmanooper, Rockwell Zentz

## 2015-01-31 NOTE — ED Provider Notes (Signed)
CSN: [id number]     Arrival date & time 01/31/15  0918 History   First MD Initiated Contact with Patient 01/31/15 [phone]     Chief Complaint  Patient presents with  . Chest Pain     (Consider location/radiation/quality/duration/timing/severity/associated sxs/prior Treatment) Patient is a 64 y.o. male presenting with chest pain. The history is provided by the patient.  Chest Pain Pain location:  Substernal area Pain quality: aching   Pain radiates to:  Does not radiate Pain radiates to the back: no   Pain severity:  Mild Onset quality:  Gradual Timing:  Constant Progression:  Unchanged Chronicity:  New Context: at rest   Relieved by:  Nothing Worsened by:  Nothing tried Ineffective treatments: asa, mylanta. Associated symptoms: cough   Associated symptoms: no abdominal pain, no fever, no headache, no nausea, no numbness, no shortness of breath and not vomiting     Past Medical History  Diagnosis Date  . GERD 04/26/2008   History reviewed. No pertinent past surgical history. No family history on file. History  Substance Use Topics  . Smoking status: Never Smoker   . Smokeless tobacco: Never Used  . Alcohol Use: No    Review of Systems  Constitutional: Negative for fever.  HENT: Negative for drooling and rhinorrhea.   Eyes: Negative for pain.  Respiratory: Positive for cough. Negative for shortness of breath.   Cardiovascular: Positive for chest pain. Negative for leg swelling.  Gastrointestinal: Negative for nausea, vomiting, abdominal pain and diarrhea.  Genitourinary: Negative for dysuria and hematuria.  Musculoskeletal: Negative for gait problem and neck pain.  Skin: Negative for color change.  Neurological: Negative for numbness and headaches.  Hematological: Negative for adenopathy.  Psychiatric/Behavioral: Negative for behavioral problems.  All other systems reviewed and are negative.     Allergies  Review of patient's allergies indicates no known  allergies.  Home Medications   Prior to Admission medications   Medication Sig Start Date End Date Taking? Authorizing Provider  aspirin 81 MG tablet Take 81 mg by mouth daily.    Historical Provider, MD   BP 150/95 mmHg  Pulse 56  Temp(Src) 98.8 F (37.1 C) (Oral)  Resp 22  Ht 5' 8.5" (1.74 m)  Wt 220 lb (99.791 kg)  BMI 32.96 kg/m2  SpO2 98% Physical Exam  Constitutional: He is oriented to person, place, and time. He appears well-developed and well-nourished.  HENT:  Head: Normocephalic and atraumatic.  Right Ear: External ear normal.  Left Ear: External ear normal.  Nose: Nose normal.  Mouth/Throat: Oropharynx is clear and moist. No oropharyngeal exudate.  Eyes: Conjunctivae and EOM are normal. Pupils are equal, round, and reactive to light.  Neck: Normal range of motion. Neck supple.  Cardiovascular: Normal rate, regular rhythm, normal heart sounds and intact distal pulses.  Exam reveals no gallop and no friction rub.   No murmur heard. Pulmonary/Chest: Effort normal and breath sounds normal. No respiratory distress. He has no wheezes.  Abdominal: Soft. Bowel sounds are normal. He exhibits no distension. There is no tenderness. There is no rebound and no guarding.  Musculoskeletal: Normal range of motion. He exhibits no edema or tenderness.  Normal strength and sensation in the upper extremity.  2+ distal pulses in the upper extremities.  Neurological: He is alert and oriented to person, place, and time.  Skin: Skin is warm and dry.  Psychiatric: He has a normal mood and affect. His behavior is normal.  Nursing note and vitals reviewed.   ED  Course  Procedures (including critical care time) Labs Review Labs Reviewed  CBC - Abnormal; Notable for the following:    WBC 11.7 (*)    All other components within normal limits  BASIC METABOLIC PANEL - Abnormal; Notable for the following:    CO2 21 (*)    Glucose, Bld 109 (*)    Creatinine, Ser 1.26 (*)    GFR calc non  Af Amer 59 (*)    All other components within normal limits  TROPONIN I - Abnormal; Notable for the following:    Troponin I 1.12 (*)    All other components within normal limits  I-STAT TROPOININ, ED - Abnormal; Notable for the following:    Troponin i, poc 0.28 (*)    All other components within normal limits  HEPARIN LEVEL (UNFRACTIONATED)    Imaging Review Dg Chest Port 1 View  01/31/2015   CLINICAL DATA:  Chest pain radiating into left arm.  EXAM: PORTABLE CHEST - 1 VIEW  COMPARISON:  02/08/2008  FINDINGS: Lung volumes are low bilaterally. There is no evidence of pulmonary edema, consolidation, pneumothorax, nodule or pleural fluid. The heart size is at the upper limits of normal. Other mediastinal contours appear normal.  IMPRESSION: Low lung volumes.  No acute findings.   Electronically Signed   By: Irish Lack M.D.   On: 01/31/2015 10:04     EKG Interpretation   Date/Time:  Monday January 31 2015 09:22:46 EDT Ventricular Rate:  82 PR Interval:  154 QRS Duration: 92 QT Interval:  366 QTC Calculation: 427 R Axis:   27 Text Interpretation:  Normal sinus rhythm Nonspecific ST and T wave  abnormality Otherwise no significant change Confirmed by Xayvion Shirah  MD,  Shala Baumbach ([id number]) on 01/31/2015 9:35:23 AM     CRITICAL CARE Performed by: Purvis Sheffield, S Total critical care time: 30 min Critical care time was exclusive of separately billable procedures and treating other patients. Critical care was necessary to treat or prevent imminent or life-threatening deterioration. Critical care was time spent personally by me on the following activities: development of treatment plan with patient and/or surrogate as well as nursing, discussions with consultants, evaluation of patient's response to treatment, examination of patient, obtaining history from patient or surrogate, ordering and performing treatments and interventions, ordering and review of laboratory studies, ordering and review of  radiographic studies, pulse oximetry and re-evaluation of patient's condition.  MDM   Final diagnoses:  NSTEMI (non-ST elevated myocardial infarction)    10:19 AM 64 y.o. male with history of tobacco abuse who presents with intermittent chest pain over the last 2 weeks. Possibly positional but does not seem to be exertional. He has had chest pain since around noon yesterday. He describes a central aching pain in the middle of his chest, currently 3 out of 10. He took Mylanta and an 81 mg aspirin this morning. Vital signs unremarkable here. Otherwise normal exam.  Pt's pain now a 1 after pain control. Will heparinize and admit for NSTEMI. He got ASA here.   Purvis Sheffield, MD 01/31/15 1531

## 2015-02-01 ENCOUNTER — Encounter (HOSPITAL_COMMUNITY): Payer: Self-pay | Admitting: Cardiovascular Disease

## 2015-02-01 DIAGNOSIS — I214 Non-ST elevation (NSTEMI) myocardial infarction: Principal | ICD-10-CM

## 2015-02-01 DIAGNOSIS — I2510 Atherosclerotic heart disease of native coronary artery without angina pectoris: Secondary | ICD-10-CM

## 2015-02-01 LAB — TROPONIN I
Troponin I: 11.38 ng/mL (ref <0.031)
Troponin I: 9.59 ng/mL (ref <0.031)

## 2015-02-01 LAB — BASIC METABOLIC PANEL
Anion gap: 8 (ref 5–15)
BUN: 13 mg/dL (ref 6–20)
CO2: 24 mmol/L (ref 22–32)
Calcium: 8.9 mg/dL (ref 8.9–10.3)
Chloride: 105 mmol/L (ref 101–111)
Creatinine, Ser: 1.26 mg/dL — ABNORMAL HIGH (ref 0.61–1.24)
GFR calc non Af Amer: 59 mL/min — ABNORMAL LOW (ref >60)
Glucose, Bld: 102 mg/dL — ABNORMAL HIGH (ref 65–99)
POTASSIUM: 4 mmol/L (ref 3.5–5.1)
Sodium: 137 mmol/L (ref 135–145)

## 2015-02-01 LAB — POCT ACTIVATED CLOTTING TIME
ACTIVATED CLOTTING TIME: 202 s
ACTIVATED CLOTTING TIME: 245 s
ACTIVATED CLOTTING TIME: 349 s
Activated Clotting Time: 227 seconds

## 2015-02-01 LAB — HEMOGLOBIN A1C
Hgb A1c MFr Bld: 5.5 % (ref 4.8–5.6)
MEAN PLASMA GLUCOSE: 111 mg/dL

## 2015-02-01 LAB — LIPID PANEL
CHOL/HDL RATIO: 4.2 ratio
Cholesterol: 142 mg/dL (ref 0–200)
HDL: 34 mg/dL — ABNORMAL LOW (ref >40)
LDL Cholesterol: 90 mg/dL (ref 0–99)
Triglycerides: 88 mg/dL (ref <150)
VLDL: 18 mg/dL (ref 0–40)

## 2015-02-01 LAB — T4, FREE: FREE T4: 1.12 ng/dL (ref 0.61–1.12)

## 2015-02-01 MED ORDER — TICAGRELOR 90 MG PO TABS: 90.0000 mg | ORAL_TABLET | Freq: Two times a day (BID) | ORAL | Status: DC

## 2015-02-01 MED ORDER — AMLODIPINE BESYLATE 5 MG PO TABS: 5.0000 mg | ORAL_TABLET | Freq: Every day | ORAL | Status: DC

## 2015-02-01 MED ORDER — METOPROLOL TARTRATE 25 MG PO TABS: 12.5000 mg | ORAL_TABLET | Freq: Two times a day (BID) | ORAL | Status: DC

## 2015-02-01 MED ORDER — ATORVASTATIN CALCIUM 80 MG PO TABS: 80.0000 mg | ORAL_TABLET | Freq: Every day | ORAL | Status: DC

## 2015-02-01 MED ORDER — NITROGLYCERIN 0.4 MG SL SUBL: 0.4000 mg | SUBLINGUAL_TABLET | SUBLINGUAL | Status: DC | PRN

## 2015-02-01 MED ORDER — AMLODIPINE BESYLATE 5 MG PO TABS
5.0000 mg | ORAL_TABLET | Freq: Every day | ORAL | Status: DC
  Administered 2015-02-01: 5 mg via ORAL
  Filled 2015-02-01: qty 1

## 2015-02-01 NOTE — Progress Notes (Signed)
CARDIAC REHAB PHASE I   PRE:  Rate/Rhythm: 57 SB  BP:  Supine: 165/76  Sitting:   Standing:    SaO2:   MODE:  Ambulation: 800 ft   POST:  Rate/Rhythm: 79 SR  BP:  Supine: 180/80  Sitting:   Standing:    SaO2:  [id number] Pt walked 800 ft with steady gait. No CP. Tolerated well. MI education completed with pt and wife who voiced understanding. Stressed importance of brilinta with stent. Gave heart healthy diet and ex ed. Pt declined CRP 2 as he wants to ex on his own. Left brochure in case he changes his mind. Encouraged watching sodium with elevated BP.   Raymond Nuttingharlene Raymond Rhodes, RN BSN  02/01/2015 9:33 AM

## 2015-02-01 NOTE — Discharge Summary (Signed)
Physician Discharge Summary     Cardiologist: Crenshaw Patient ID: Raymond Norris MRN: [id number] DOB/AGE: Jun 19, 1951 64 y.o.  Admit date: 01/31/2015 Discharge date: 02/01/2015  Admission Diagnoses:  NSTEMI  Discharge Diagnoses:  Principal Problem:   NSTEMI (non-ST elevated myocardial infarction) Active Problems:   Unstable angina   Benign essential HTN   CAD (coronary artery disease)   dyslipidemia  Discharged Condition: stable  Hospital Course:   The patient is a 64 yo male with a history of GERD and remote tobacco(quit over 25 years ago). 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 EF was 46%. He presents today with chest pain across his chest, which started about two weeks ago, and has been off and on. At worst it was 2/10 but he did notice it radiated to his neck and left arm. He denies nausea, vomiting, fever, shortness of breath, orthopnea, dizziness, PND, cough, congestion, abdominal pain, hematochezia, melena, lower extremity edema, claudication. Deep inspiration does not make it worse. 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 He also has had a lot of gas and thought it could be related to that. He has not seen his PCP in four years because he only goes if he has a problem. No family history of CAD.  Patient was taken directly from the emergency room to the Cath Lab because he was still having active chest pain and troponin was now over 1.  The procedure revealed severe CAD involving the OM1 and OM 2 branches of the circumflex. Each one was successfully stented and the patient was started on aspirin, beta blocker and Brilinta.  We also added amlodipine 5 mg for better blood pressure control.  Statin was initiated. See lipid panel below. TSH was mildly elevated we checked a free T4 which was at the upper limit of the normal range (1.12).  Need to check T3.  A1c was 5.5.  Heart rate was as low as 44 on telemetry. No  obvious symptoms of obstructive sleep apnea. Urine creatinine was stable. The patient was seen by Dr. Anne Fu who felt he was stable for DC home.   Consults: None  Significant Diagnostic Studies:   Troponin Cardiac Panel (last 3 results)  Recent Labs  01/31/15 1906 02/01/15 0100 02/01/15 0612  TROPONINI 8.49* 11.38* 9.59*     Lipid Panel     Component Value Date/Time   CHOL 142 02/01/2015 0100   TRIG 88 02/01/2015 0100   HDL 34* 02/01/2015 0100   CHOLHDL 4.2 02/01/2015 0100   VLDL 18 02/01/2015 0100   LDLCALC 90 02/01/2015 0100   Left Heart catheterization 1. 2nd Mrg lesion, 100% stenosed. There is a 0% residual stenosis post intervention. 2. A drug-eluting stent was placed. 3. 1st Mrg lesion, 90% stenosed. There is a 0% residual stenosis post intervention. 4. A drug-eluting stent was placed.  FINAL CONCLUSIONS:  SEVERE SINGLE VESSEL CAD INVOLVING MULTIPLE BRANCHES OF THE LEFT CIRCUMFLEX WITH SUCCESSFUL PCI OF THE FIRST AND SECOND OM BRANCHES  MILD DIFFUSE NONOBSTRUCTIVE DISEASE INVOLVING THE RCA AND LAD   MODERATE DIAGONAL BRANCH STENOSIS  MILD SEGMENTAL LV CONTRACTION ABNORMALITY  RECOMMENDATIONS:  CONTINUE AGGRESSIVE MEDICAL MANAGEMENT  DAPT WITH ASA AND BRILINTA X 12 MONTHS MINIMUM  Treatments: See above  Discharge Exam: Blood pressure 160/69, pulse 76, temperature 98.9 F (37.2 C), temperature source Oral, resp. rate 16, height 5' 8.5" (1.74 m), weight 213 lb 3 oz (96.7 kg), SpO2 96 %.  Disposition: 01-Home or Self Care      Discharge Instructions    Diet - low sodium heart healthy    Complete by:  As directed      Discharge instructions    Complete by:  As directed      Increase activity slowly    Complete by:  As directed             Medication List    TAKE these medications        amLODipine 5 MG tablet  Commonly known as:  NORVASC  Take 1 tablet (5 mg total) by mouth daily.     aspirin 81 MG tablet  Take 81 mg by mouth  daily.     atorvastatin 80 MG tablet  Commonly known as:  LIPITOR  Take 1 tablet (80 mg total) by mouth daily at 6 PM.     metoprolol tartrate 25 MG tablet  Commonly known as:  LOPRESSOR  Take 0.5 tablets (12.5 mg total) by mouth 2 (two) times daily.     nitroGLYCERIN 0.4 MG SL tablet  Commonly known as:  NITROSTAT  Place 1 tablet (0.4 mg total) under the tongue every 5 (five) minutes x 3 doses as needed for chest pain.     ticagrelor 90 MG Tabs tablet  Commonly known as:  BRILINTA  Take 1 tablet (90 mg total) by mouth 2 (two) times daily.       Follow-up Information    Follow up with Kierstin January, PA-C On 03/02/2015.   Specialties:  Physician Assistant, Radiology, Interventional Cardiology   Why:  11:30 AM   Contact information:   [address] [place] [address] [phone]      Greater than 30 minutes was spent completing the patient's discharge.    SignedWilburt Finlay: Niko Penson, PA-C 02/01/2015, 11:42 AM

## 2015-02-01 NOTE — Progress Notes (Signed)
    Subjective: Doing well.  No CP  Objective: Vital signs in last 24 hours: Temp:  [97.9 F (36.6 C)-99.3 F (37.4 C)] 98.9 F (37.2 C) (06/07 0759) Pulse Rate:  [45-81] 76 (06/07 0759) Resp:  [11-22] 16 (06/07 0759) BP: (122-169)/(64-97) 160/69 mmHg (06/07 0759) SpO2:  [95 %-99 %] 96 % (06/07 0759) Weight:  [213 lb 3 oz (96.7 kg)-220 lb (99.791 kg)] 213 lb 3 oz (96.7 kg) (06/07 0000) Last BM Date: 01/30/15  Intake/Output from previous day: 06/06 0701 - 06/07 0700 In: 1587.3 [P.O.:240; I.V.:1347.3] Out: 375 [Urine:375] Intake/Output this shift: Total I/O In: 240 [P.O.:240] Out: -   Medications Scheduled Meds: . aspirin  81 mg Oral Daily  . atorvastatin  80 mg Oral q1800  . metoprolol tartrate  12.5 mg Oral BID  . sodium chloride  3 mL Intravenous Q12H  . ticagrelor  90 mg Oral BID   Continuous Infusions: . nitroGLYCERIN Stopped (01/31/15 1744)   PRN Meds:.sodium chloride, acetaminophen, nitroGLYCERIN, ondansetron (ZOFRAN) IV, oxyCODONE-acetaminophen, sodium chloride  PE: General appearance: alert, cooperative and no distress Lungs: clear to auscultation bilaterally Heart: regular rate and rhythm, S1, S2 normal, no murmur, click, rub or gallop Extremities: No LEE Pulses: 2+ and symmetric Skin: Warm and dry.  Right wrist cath site: Stable. Good distal pulse. Neurologic: Grossly normal  Lab Results:   Recent Labs  01/31/15 0946  WBC 11.7*  HGB 15.5  HCT 46.6  PLT 355   BMET  Recent Labs  01/31/15 0946 02/01/15 0612  NA 139 137  K 3.6 4.0  CL 110 105  CO2 21* 24  GLUCOSE 109* 102*  BUN 12 13  CREATININE 1.26* 1.26*  CALCIUM 8.9 8.9   PT/INR No results for input(s): LABPROT, INR in the last 72 hours. Cholesterol  Recent Labs  02/01/15 0100  CHOL 142   Lipid Panel     Component Value Date/Time   CHOL 142 02/01/2015 0100   TRIG 88 02/01/2015 0100   HDL 34* 02/01/2015 0100   CHOLHDL 4.2 02/01/2015 0100   VLDL 18 02/01/2015 0100   LDLCALC 90 02/01/2015 0100   1. 2nd Mrg lesion, 100% stenosed. There is a 0% residual stenosis post intervention. 2. A drug-eluting stent was placed. 3. 1st Mrg lesion, 90% stenosed. There is a 0% residual stenosis post intervention. 4. A drug-eluting stent was placed.  FINAL CONCLUSIONS:  SEVERE SINGLE VESSEL CAD INVOLVING MULTIPLE BRANCHES OF THE LEFT CIRCUMFLEX WITH SUCCESSFUL PCI OF THE FIRST AND SECOND OM BRANCHES  MILD DIFFUSE NONOBSTRUCTIVE DISEASE INVOLVING THE RCA AND LAD   MODERATE DIAGONAL BRANCH STENOSIS  MILD SEGMENTAL LV CONTRACTION ABNORMALITY  RECOMMENDATIONS:  CONTINUE AGGRESSIVE MEDICAL MANAGEMENT  DAPT WITH ASA AND BRILINTA X 12 MONTHS MINIMUM   Assessment/Plan  Principal Problem:   NSTEMI (non-ST elevated myocardial infarction) Active Problems:   Unstable angina   Benign essential HTN   CAD (coronary artery disease)  SP LHC and stenting of the OM 1 and OM 2.  Distal cirx-medical therapy.  ASA, brilinta, lopressor, statin.  Amlodipine added for BP.   Checking Free T4.  TSH mildly elevated and HR  As low as 44 on tele.  No OSA symptoms.  Cardiac rehab.  SCr stable.    LOS: 1 day    HAGER, BRYAN PA-C 02/01/2015 8:11 AM  Personally seen and examined. Agree with above. DAPT discussed  Rehab At least one week out of work note.  Stents reviewed .  Donato SchultzSKAINS, Italo Banton, MD

## 2015-02-01 NOTE — Progress Notes (Signed)
CM spoke with pt regarding Brilinta, pamphlet given with 30 day free card and copay card enclosed. [facility] [phone])  on [address]  Dr. called to confirm medication is in stock. Benefits check in progress , CM to f/u with pt with results.

## 2015-02-02 MED FILL — Heparin Sodium (Porcine) 2 Unit/ML in Sodium Chloride 0.9%: INTRAMUSCULAR | Qty: 1000 | Status: AC

## 2015-02-05 ENCOUNTER — Encounter (HOSPITAL_COMMUNITY): Payer: Self-pay | Admitting: Emergency Medicine

## 2015-02-05 ENCOUNTER — Emergency Department (HOSPITAL_COMMUNITY)
Admission: EM | Admit: 2015-02-05 | Discharge: 2015-02-05 | Disposition: A | Discharge status: Home / Self Care | Payer: No Typology Code available for payment source | Attending: Emergency Medicine | Admitting: Emergency Medicine

## 2015-02-05 DIAGNOSIS — Z79899 Other long term (current) drug therapy: Secondary | ICD-10-CM | POA: Insufficient documentation

## 2015-02-05 DIAGNOSIS — Z7982 Long term (current) use of aspirin: Secondary | ICD-10-CM | POA: Diagnosis not present

## 2015-02-05 DIAGNOSIS — I2510 Atherosclerotic heart disease of native coronary artery without angina pectoris: Secondary | ICD-10-CM | POA: Diagnosis not present

## 2015-02-05 DIAGNOSIS — Z9861 Coronary angioplasty status: Secondary | ICD-10-CM | POA: Insufficient documentation

## 2015-02-05 DIAGNOSIS — Z7901 Long term (current) use of anticoagulants: Secondary | ICD-10-CM | POA: Diagnosis not present

## 2015-02-05 DIAGNOSIS — Z8719 Personal history of other diseases of the digestive system: Secondary | ICD-10-CM | POA: Diagnosis not present

## 2015-02-05 DIAGNOSIS — R0982 Postnasal drip: Secondary | ICD-10-CM | POA: Diagnosis not present

## 2015-02-05 DIAGNOSIS — I252 Old myocardial infarction: Secondary | ICD-10-CM | POA: Diagnosis not present

## 2015-02-05 DIAGNOSIS — R05 Cough: Secondary | ICD-10-CM

## 2015-02-05 DIAGNOSIS — J029 Acute pharyngitis, unspecified: Secondary | ICD-10-CM | POA: Insufficient documentation

## 2015-02-05 DIAGNOSIS — R059 Cough, unspecified: Secondary | ICD-10-CM

## 2015-02-05 LAB — RAPID STREP SCREEN (MED CTR MEBANE ONLY): STREPTOCOCCUS, GROUP A SCREEN (DIRECT): NEGATIVE

## 2015-02-05 MED ORDER — FLUTICASONE PROPIONATE 50 MCG/ACT NA SUSP: 2.0000 | Freq: Every day | NASAL | Status: DC

## 2015-02-05 MED ORDER — CETIRIZINE HCL 10 MG PO TABS: 10.0000 mg | ORAL_TABLET | Freq: Every day | ORAL | Status: DC

## 2015-02-05 NOTE — ED Notes (Signed)
Pt reports sore throat since 3 weeks ago. Pt was here on Monday for cardiac cath with 2 stents places. Pt reports taking Halls with no relief. Airway intact.

## 2015-02-05 NOTE — ED Provider Notes (Signed)
CSN: [id number]     Arrival date & time 02/05/15  1053 History  This chart was scribed for non-physician practitioner Dierdre Forth, PA-C, working with Blake Divine, MD, by Tanda Rockers, ED Scribe. This patient was seen in room TR05C/TR05C and the patient's care was started at 11:29 AM.     Chief Complaint  Patient presents with  . Sore Throat   The history is provided by the patient and medical records. No language interpreter was used.     HPI Comments: Raymond Norris is a 64 y.o. male with hx CAD s/p stents and MI (cardiac cath with stent placement 6 days ago) who presents to the Emergency Department complaining of left sided sore throat x 2 weeks. Pt describes it as painful and is having pain with swallowing. He is able to eat and drink but notes it is painful. The pain radiates up to his left ear. Pt notes nasal congestion and phlegm in the back of his throat.  He has been coughing the phlegm up due to throat irritation.  Phlem is clear. He has been taking Halls and OTC sore throat lozanges without relief. The pain is worse at night.   Pt was in the ED on Monday, 01/31/2015 (approximately 5 days ago) for chest pain x 2 weeks and had 2 stents placed emergently. He mentions that he has felt fine since the surgery besides the sore throat. Denies fever, chills, choking sensation while swallowing, chest pain, shortness of breath, nausea, vomiting, abdominal pain, leg swelling, hemoptysis or any other symptoms. Pt is currently on anti-coagulants.   Past Medical History  Diagnosis Date  . GERD 04/26/2008  . Coronary artery disease   . Myocardial infarction    Past Surgical History  Procedure Laterality Date  . Cardiac catheterization N/A 01/31/2015    Procedure: Left Heart Cath and Coronary Angiography;  Surgeon: Tonny Bollman, MD;  Location: [facility] INVASIVE CV LAB;  Service: Cardiovascular;  Laterality: N/A;  . Percutaneous coronary stent intervention (pci-s)  01/2015    LEFT CIRCUMFLEX   1ST & 2ND   OM BRANCHES   History reviewed. No pertinent family history. History  Substance Use Topics  . Smoking status: Never Smoker   . Smokeless tobacco: Never Used  . Alcohol Use: No    Review of Systems  Constitutional: Negative for fever, chills, appetite change and fatigue.  HENT: Positive for congestion, ear pain, postnasal drip, sore throat and trouble swallowing ( painful). Negative for ear discharge, mouth sores, rhinorrhea, sinus pressure and voice change.   Eyes: Negative for visual disturbance.  Respiratory: Positive for cough. Negative for chest tightness, shortness of breath, wheezing and stridor.   Cardiovascular: Negative for chest pain, palpitations and leg swelling.  Gastrointestinal: Negative for nausea, vomiting, abdominal pain and diarrhea.  Genitourinary: Negative for dysuria, urgency, frequency and hematuria.  Musculoskeletal: Negative for myalgias, back pain, arthralgias and neck stiffness.  Skin: Negative for rash.  Neurological: Negative for syncope, light-headedness, numbness and headaches.  Hematological: Negative for adenopathy.  Psychiatric/Behavioral: The patient is not nervous/anxious.   All other systems reviewed and are negative.   Allergies  Review of patient's allergies indicates no known allergies.  Home Medications   Prior to Admission medications   Medication Sig Start Date End Date Taking? Authorizing Provider  amLODipine (NORVASC) 5 MG tablet Take 1 tablet (5 mg total) by mouth daily. 02/01/15   Dwana Melena, PA-C  aspirin 81 MG tablet Take 81 mg by mouth daily.  Historical Provider, MD  atorvastatin (LIPITOR) 80 MG tablet Take 1 tablet (80 mg total) by mouth daily at 6 PM. 02/01/15   Dwana Melena, PA-C  cetirizine (ZYRTEC ALLERGY) 10 MG tablet Take 1 tablet (10 mg total) by mouth daily. 02/05/15   Keigo Whalley, PA-C  fluticasone (FLONASE) 50 MCG/ACT nasal spray Place 2 sprays into both nostrils daily. 02/05/15   Lea Walbert, PA-C  metoprolol tartrate (LOPRESSOR) 25 MG tablet Take 0.5 tablets (12.5 mg total) by mouth 2 (two) times daily. 02/01/15   Dwana Melena, PA-C  nitroGLYCERIN (NITROSTAT) 0.4 MG SL tablet Place 1 tablet (0.4 mg total) under the tongue every 5 (five) minutes x 3 doses as needed for chest pain. 02/01/15   Dwana Melena, PA-C  ticagrelor (BRILINTA) 90 MG TABS tablet Take 1 tablet (90 mg total) by mouth 2 (two) times daily. 02/01/15   Dwana Melena, PA-C   Triage Vitals: BP 118/75 mmHg  Pulse 55  Temp(Src) 98.7 F (37.1 C) (Oral)  Resp 17  SpO2 97%   Physical Exam  Constitutional: He appears well-developed and well-nourished. No distress.  HENT:  Head: Normocephalic and atraumatic.  Right Ear: Tympanic membrane, external ear and ear canal normal.  Left Ear: Tympanic membrane, external ear and ear canal normal.  Nose: Nose normal. No mucosal edema or rhinorrhea.  Mouth/Throat: Uvula is midline and mucous membranes are normal. Mucous membranes are not dry. No trismus in the jaw. No uvula swelling. Posterior oropharyngeal edema and posterior oropharyngeal erythema present. No tonsillar abscesses.  Posterior oropharynx with erythema and edema on the tonsils  Eyes: Conjunctivae are normal.  Neck: Normal range of motion, full passive range of motion without pain and phonation normal. No tracheal tenderness, no spinous process tenderness and no muscular tenderness present. No rigidity. No erythema and normal range of motion present. No Brudzinski's sign and no Kernig's sign noted.  Range of motion without pain  No midline or paraspinal tenderness Normal phonation No stridor Handling secretions without difficulty No nuchal rigidity or meningeal signs  Cardiovascular: Normal rate, regular rhythm and normal heart sounds.   Pulses:      Radial pulses are 2+ on the right side, and 2+ on the left side.  Pulmonary/Chest: Effort normal and breath sounds normal. No stridor. No respiratory  distress. He has no decreased breath sounds. He has no wheezes.  Equal chest expansion, clear and equal breath sounds without focal wheezes, rhonchi or rales  Musculoskeletal: Normal range of motion.  Lymphadenopathy:       Head (right side): Submandibular and tonsillar adenopathy present. No submental, no preauricular, no posterior auricular and no occipital adenopathy present.       Head (left side): Submandibular and tonsillar adenopathy present. No submental, no preauricular, no posterior auricular and no occipital adenopathy present.    He has cervical adenopathy.       Right cervical: No superficial cervical, no deep cervical and no posterior cervical adenopathy present.      Left cervical: No superficial cervical, no deep cervical and no posterior cervical adenopathy present.  Neurological: He is alert.  Alert and oriented Moves all extremities without ataxia  Skin: Skin is warm and dry. He is not diaphoretic.  Psychiatric: He has a normal mood and affect.  Nursing note and vitals reviewed.   ED Course  Procedures (including critical care time)  DIAGNOSTIC STUDIES: Oxygen Saturation is 97% on RA, normal by my interpretation.    COORDINATION OF CARE: 11:39  AM-Discussed treatment plan which includes rapid strep test with pt at bedside and pt agreed to plan.   Labs Review Labs Reviewed  RAPID STREP SCREEN (NOT AT [facility])  CULTURE, GROUP A STREP    Imaging Review No results found.   EKG Interpretation None      MDM   Final diagnoses:  Sore throat  Post-nasal drip  Cough   Hendricks Limes Limb presents with sore throat x 2 weeks; beginning approx 1 week prior to his cardiac cath and stent placement and gradually worsening.  Pt without difficulty swallowing, but does have painful swallowing.  Mild edema and erythema of the tonsils bilaterally without exudate and no cervical adenopathy.  Pt without signs or symptoms suggestive of ACS or other cardiac related condition.     1:49 PM Rapid strep negative.  Pt can use to maintain a patent airway and denies chest pain or shortness of breath. Vitals are stable. He is tolerating by mouth without difficulty. Will discharge home with PCP follow-up in 2 days.  Return precautions discussed including difficulty swallowing, high fevers or other concerns.  Viral versus allergic pharyngitis. Patient will be discharged home with Flonase and Zyrtec.  BP 120/78 mmHg  Pulse 58  Temp(Src) 98.7 F (37.1 C) (Oral)  Resp 17  SpO2 100%  I personally performed the services described in this documentation, which was scribed in my presence. The recorded information has been reviewed and is accurate.   Dahlia Client Blake Goya, PA-C 02/05/15 1350  Blake Divine, MD 02/07/15 2019

## 2015-02-05 NOTE — Discharge Instructions (Signed)
1. Medications: flonase, zyrtec, usual home medications 2. Treatment: rest, drink plenty of fluids,  3. Follow Up: Please followup with your primary doctor in 2 days for discussion of your diagnoses and further evaluation after today's visit; if you do not have a primary care doctor use the resource guide provided to find one; Please return to the ER for worsening dose, feeling of throat closing, inability to eat or drink

## 2015-02-07 LAB — CULTURE, GROUP A STREP: Strep A Culture: NEGATIVE

## 2015-02-08 DIAGNOSIS — Z0279 Encounter for issue of other medical certificate: Secondary | ICD-10-CM

## 2015-03-02 ENCOUNTER — Ambulatory Visit (INDEPENDENT_AMBULATORY_CARE_PROVIDER_SITE_OTHER): Payer: No Typology Code available for payment source | Admitting: Physician Assistant

## 2015-03-02 ENCOUNTER — Encounter: Payer: Self-pay | Admitting: Physician Assistant

## 2015-03-02 VITALS — BP 138/80 | HR 47 | Ht 68.5 in | Wt 210.6 lb

## 2015-03-02 DIAGNOSIS — I10 Essential (primary) hypertension: Secondary | ICD-10-CM | POA: Diagnosis not present

## 2015-03-02 DIAGNOSIS — E785 Hyperlipidemia, unspecified: Secondary | ICD-10-CM

## 2015-03-02 DIAGNOSIS — I2510 Atherosclerotic heart disease of native coronary artery without angina pectoris: Secondary | ICD-10-CM

## 2015-03-02 DIAGNOSIS — I200 Unstable angina: Secondary | ICD-10-CM | POA: Diagnosis not present

## 2015-03-02 DIAGNOSIS — I214 Non-ST elevation (NSTEMI) myocardial infarction: Secondary | ICD-10-CM | POA: Diagnosis not present

## 2015-03-02 DIAGNOSIS — I2583 Coronary atherosclerosis due to lipid rich plaque: Secondary | ICD-10-CM

## 2015-03-02 LAB — HEPATIC FUNCTION PANEL
ALBUMIN: 4 g/dL (ref 3.5–5.2)
ALT: 27 U/L (ref 0–53)
AST: 17 U/L (ref 0–37)
Alkaline Phosphatase: 47 U/L (ref 39–117)
Bilirubin, Direct: 0.2 mg/dL (ref 0.0–0.3)
Total Bilirubin: 0.9 mg/dL (ref 0.2–1.2)
Total Protein: 7.3 g/dL (ref 6.0–8.3)

## 2015-03-02 LAB — T3: T3, Total: 97.3 ng/dL (ref 80.0–204.0)

## 2015-03-02 MED ORDER — ATORVASTATIN CALCIUM 80 MG PO TABS: 80.0000 mg | ORAL_TABLET | Freq: Every day | ORAL | Status: DC

## 2015-03-02 MED ORDER — TICAGRELOR 90 MG PO TABS: 90.0000 mg | ORAL_TABLET | Freq: Two times a day (BID) | ORAL | Status: DC

## 2015-03-02 NOTE — Assessment & Plan Note (Addendum)
Status post stenting to the OM1 and OM 2 branches of the circumflex. He is on aspirin, Brilinta Lopressor and statin. Will provide him samples today and refill his prescription. Heart rate will not allow increase in metoprolol.  He did have a mildly elevated TSH and T4 at the upper limits of normal. We'll check a T3 today.  We discussed phase II cardiac rehabilitation and sounds as though he will not participate but has taken up exercising daily on his own.

## 2015-03-02 NOTE — Progress Notes (Signed)
Patient ID: Raymond Norris, male   DOB: 10-Jul-1951, 64 y.o.   MRN: [id number]    Date:  03/02/2015   ID:  Raymond Norris, DOB 1951/08/23, MRN [id number]  PCP:  Rogelia Boga, MD  Primary Cardiologist:  Jens Som   No chief complaint on file.    History of Present Illness: Raymond Norris is a 64 y.o. male   The patient is a 64 yo male with a history of GERD and remote tobacco(quit over 25 years ago). 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 EF was 46%. He presents today with chest pain across his chest, which started about two weeks ago, and has been off and on. At worst it was 2/10 but he did notice it radiated to his neck and left arm. He denies nausea, vomiting, fever, shortness of breath, orthopnea, dizziness, PND, cough, congestion, abdominal pain, hematochezia, melena, lower extremity edema, claudication. Deep inspiration does not make it worse. 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 He also has had a lot of gas and thought it could be related to that. He has not seen his PCP in four years because he only goes if he has a problem. No family history of CAD. Patient was taken directly from the emergency room to the Cath Lab because he was still having active chest pain and troponin was now over 1. The procedure revealed severe CAD involving the OM1 and OM 2 branches of the circumflex. Each one was successfully stented and the patient was started on aspirin, beta blocker and Brilinta. We also added amlodipine 5 mg for better blood pressure control. Statin was initiated. See lipid panel below. TSH was mildly elevated we checked a free T4 which was at the upper limit of the normal range (1.12). Need to check T3. A1c was 5.5. Heart rate was as low as 44 on telemetry. No obvious symptoms of obstructive sleep apnea. Urine creatinine was stable.   Patient presents for posthospital evaluation.  Reports doing  quite well. He's been walking about 30 minutes daily without any complaints. He and his wife are trying to make dietary changes as discussed in the hospital. He is going to work starting tomorrow but it is a non-stressful job.  He understands the importance of taking the Brilinta and has not missed any doses.  He does report occasional shortness of breath which is likely related to Brilinta.  The patient currently denies nausea, vomiting, fever, chest pain, shortness of breath, orthopnea, dizziness, PND, cough, congestion, abdominal pain, hematochezia, melena, lower extremity edema, claudication.  Wt Readings from Last 3 Encounters:  03/02/15 210 lb 9.6 oz (95.528 kg)  02/01/15 213 lb 3 oz (96.7 kg)  07/16/14 218 lb (98.884 kg)     Past Medical History  Diagnosis Date  . GERD 04/26/2008  . Coronary artery disease   . Myocardial infarction     Current Outpatient Prescriptions  Medication Sig Dispense Refill  . amLODipine (NORVASC) 5 MG tablet Take 1 tablet (5 mg total) by mouth daily. 30 tablet 11  . aspirin 81 MG tablet Take 81 mg by mouth daily.    Marland Kitchen atorvastatin (LIPITOR) 80 MG tablet Take 1 tablet (80 mg total) by mouth daily at 6 PM. 30 tablet 11  . cetirizine (ZYRTEC ALLERGY) 10 MG tablet Take 1 tablet (10 mg total) by mouth daily. 30 tablet 1  . fluticasone (FLONASE) 50 MCG/ACT nasal spray Place  2 sprays into both nostrils daily. 9.9 g 2  . metoprolol tartrate (LOPRESSOR) 25 MG tablet Take 0.5 tablets (12.5 mg total) by mouth 2 (two) times daily. 60 tablet 4  . nitroGLYCERIN (NITROSTAT) 0.4 MG SL tablet Place 1 tablet (0.4 mg total) under the tongue every 5 (five) minutes x 3 doses as needed for chest pain. 25 tablet 12  . ticagrelor (BRILINTA) 90 MG TABS tablet Take 1 tablet (90 mg total) by mouth 2 (two) times daily. 60 tablet 11   No current facility-administered medications for this visit.    Allergies:   No Known Allergies  Social History:  The patient  reports that he has  never smoked. He has never used smokeless tobacco. He reports that he does not drink alcohol or use illicit drugs.   Family history:   Family History  Problem Relation Age of Onset  . Family history unknown: Yes    ROS:  Please see the history of present illness.  All other systems reviewed and negative.   PHYSICAL EXAM: VS:  BP 138/80 mmHg  Pulse 47  Ht 5' 8.5" (1.74 m)  Wt 210 lb 9.6 oz (95.528 kg)  BMI 31.55 kg/m2 obese, well developed, in no acute distress HEENT: Pupils are equal round react to light accommodation extraocular movements are intact.  Neck: no JVDNo cervical lymphadenopathy. Cardiac: Regular rate and rhythm without murmurs rubs or gallops. Lungs:  clear to auscultation bilaterally, no wheezing, rhonchi or rales Abd: soft, nontender, positive bowel sounds all quadrants, no hepatosplenomegaly Ext: no lower extremity edema.  2+ radial and dorsalis pedis pulses. Skin: warm and dry Neuro:  Grossly normal  Lipid Panel     Component Value Date/Time   CHOL 142 02/01/2015 0100   TRIG 88 02/01/2015 0100   HDL 34* 02/01/2015 0100   CHOLHDL 4.2 02/01/2015 0100   VLDL 18 02/01/2015 0100   LDLCALC 90 02/01/2015 0100     EKG:  Sinus bradycardia rate 47 bpm  ASSESSMENT AND PLAN:  Problem List Items Addressed This Visit    Unstable angina   Relevant Medications   atorvastatin (LIPITOR) 80 MG tablet   Other Relevant Orders   EKG 12-Lead   NSTEMI (non-ST elevated myocardial infarction) - Primary   Relevant Medications   atorvastatin (LIPITOR) 80 MG tablet   Other Relevant Orders   EKG 12-Lead   Dyslipidemia    Continue statin. We'll check LFTs today and lipid panel in 2 months next office visit.      Relevant Medications   atorvastatin (LIPITOR) 80 MG tablet   CAD (coronary artery disease)    Status post stenting to the OM1 and OM 2 branches of the circumflex. He is on aspirin, Brilinta Lopressor and statin. Will provide him samples today and refill his  prescription. Heart rate will not allow increase in metoprolol.  He did have a mildly elevated TSH and T4 at the upper limits of normal. We'll check a T3 today.  We discussed phase II cardiac rehabilitation and sounds as though he will not participate but has taken up exercising daily on his own.      Relevant Medications   atorvastatin (LIPITOR) 80 MG tablet   Benign essential HTN    Blood pressure is just about at target. Maybe consider increasing amlodipine to 10 mg at next visit. His heart rate is in the 40s so will not increase his metoprolol. He is not symptomatic at this rate      Relevant Medications  atorvastatin (LIPITOR) 80 MG tablet   Other Relevant Orders   Lipid Profile   Hepatic function panel   T3

## 2015-03-02 NOTE — Assessment & Plan Note (Signed)
Continue statin. We'll check LFTs today and lipid panel in 2 months next office visit.

## 2015-03-02 NOTE — Patient Instructions (Addendum)
Medication Instructions:  Your physician recommends that you continue on your current medications as directed. Please refer to the Current Medication list given to you today.  Labwork: Your physician recommends that you have labs today liver panel and T3  Your physician recommends that you return for lab work in: 2 months, fasting lipid panel. This means nothing to eat or drink 8 hours before lab work.   Testing/Procedures: none  Follow-Up: Your physician recommends that you schedule a follow-up appointment in: 3 months with Dr. Jens Somrenshaw.   Any Other Special Instructions Will Be Listed Below (If Applicable).

## 2015-03-02 NOTE — Assessment & Plan Note (Signed)
Blood pressure is just about at target. Maybe consider increasing amlodipine to 10 mg at next visit. His heart rate is in the 40s so will not increase his metoprolol. He is not symptomatic at this rate

## 2015-05-04 ENCOUNTER — Other Ambulatory Visit: Payer: No Typology Code available for payment source

## 2015-05-17 ENCOUNTER — Telehealth: Payer: Self-pay

## 2015-05-17 NOTE — Telephone Encounter (Signed)
Patient sent from [facility] street for Brilanta 90 mg samples.  Given 8 sample bottles # 8 in each

## 2015-05-19 ENCOUNTER — Telehealth: Payer: Self-pay

## 2015-05-19 NOTE — Telephone Encounter (Signed)
Prior auth for Brilinta  sent to [facility] via cover my meds.

## 2015-05-23 ENCOUNTER — Telehealth: Payer: Self-pay

## 2015-05-23 NOTE — Telephone Encounter (Signed)
Fax from [facility] stating "Prior auth not needed" for Brilinta. Pharmacy notified.

## 2015-08-04 ENCOUNTER — Encounter: Payer: Self-pay | Admitting: *Deleted

## 2015-08-04 ENCOUNTER — Encounter: Payer: Self-pay | Admitting: Cardiology

## 2015-08-04 ENCOUNTER — Ambulatory Visit (INDEPENDENT_AMBULATORY_CARE_PROVIDER_SITE_OTHER): Payer: BLUE CROSS/BLUE SHIELD | Admitting: Cardiology

## 2015-08-04 VITALS — BP 132/78 | HR 60 | Ht 68.0 in | Wt 217.0 lb

## 2015-08-04 DIAGNOSIS — E785 Hyperlipidemia, unspecified: Secondary | ICD-10-CM | POA: Diagnosis not present

## 2015-08-04 DIAGNOSIS — R0989 Other specified symptoms and signs involving the circulatory and respiratory systems: Secondary | ICD-10-CM | POA: Diagnosis not present

## 2015-08-04 MED ORDER — ROSUVASTATIN CALCIUM 40 MG PO TABS: 40.0000 mg | ORAL_TABLET | Freq: Every day | ORAL | Status: DC

## 2015-08-04 NOTE — Assessment & Plan Note (Signed)
Blood pressure controlled. Continue present medications. He is having occasional mild problems with erectile dysfunction. If this worsens we will discontinue his metoprolol to see if this improves.

## 2015-08-04 NOTE — Patient Instructions (Signed)
Medication Instructions:   STOP LIPITOR  START CRESTOR OR ROSUVASTATIN 40 MG ONCE DAILY   Labwork:  Your physician recommends that you return for lab work IN 4 WEEKS- DO NOT EAT PRIOR TO LAB WORK  Testing/Procedures:  Your physician has requested that you have an abdominal aorta duplex. During this test, an ultrasound is used to evaluate the aorta. Allow 30 minutes for this exam. Do not eat after midnight the day before and avoid carbonated beverages   Follow-Up:  Your physician wants you to follow-up in: 6 MONTHS WITH DR Jens SomRENSHAW You will receive a reminder letter in the mail two months in advance. If you don't receive a letter, please call our office to schedule the follow-up appointment.   If you need a refill on your cardiac medications before your next appointment, please call your pharmacy.

## 2015-08-04 NOTE — Assessment & Plan Note (Signed)
Continue aspirin, brilinta and statin. 

## 2015-08-04 NOTE — Assessment & Plan Note (Signed)
Patient has felt nausea with Lipitor. I will try Crestor 40 mg daily. Check lipids and liver in 4 weeks.

## 2015-08-04 NOTE — Progress Notes (Signed)
      HPI: FU CAD. Patient had cardiac catheterization June 2016. There was a 100% second marginal and a 90% first marginal. He had drug-eluting stents to both lesions. There was no obstructive disease in the right coronary artery or LAD. LV function was normal. Since last seen, the patient denies any dyspnea on exertion, orthopnea, PND, pedal edema, palpitations, syncope or chest pain.   Current Outpatient Prescriptions  Medication Sig Dispense Refill  . amLODipine (NORVASC) 5 MG tablet Take 1 tablet (5 mg total) by mouth daily. 30 tablet 11  . aspirin 81 MG tablet Take 81 mg by mouth daily.    . metoprolol tartrate (LOPRESSOR) 25 MG tablet Take 0.5 tablets (12.5 mg total) by mouth 2 (two) times daily. 60 tablet 4  . nitroGLYCERIN (NITROSTAT) 0.4 MG SL tablet Place 1 tablet (0.4 mg total) under the tongue every 5 (five) minutes x 3 doses as needed for chest pain. 25 tablet 12  . ticagrelor (BRILINTA) 90 MG TABS tablet Take 1 tablet (90 mg total) by mouth 2 (two) times daily. 60 tablet 11   No current facility-administered medications for this visit.     Past Medical History  Diagnosis Date  . GERD 04/26/2008  . Coronary artery disease   . Myocardial infarction [facility](HCC)     Past Surgical History  Procedure Laterality Date  . Cardiac catheterization N/A 01/31/2015    Procedure: Left Heart Cath and Coronary Angiography;  Surgeon: Tonny BollmanMichael Cooper, MD;  [facility];  Service: Cardiovascular;  Laterality: N/A;  . Percutaneous coronary stent intervention (pci-s)  01/2015    LEFT CIRCUMFLEX  1ST & 2ND   OM BRANCHES    Social History   Social History  . Marital Status: Married    Spouse Name: N/A  . Number of Children: N/A  . Years of Education: N/A   Occupational History  . Not on file.   Social History Main Topics  . Smoking status: Never Smoker   . Smokeless tobacco: Never Used  . Alcohol Use: No  . Drug Use: No  . Sexual Activity: Not on file   Other Topics  Concern  . Not on file   Social History Narrative    ROS: no fevers or chills, productive cough, hemoptysis, dysphasia, odynophagia, melena, hematochezia, dysuria, hematuria, rash, seizure activity, orthopnea, PND, pedal edema, claudication. Remaining systems are negative.  Physical Exam: Well-developed well-nourished in no acute distress.  Skin is warm and dry.  HEENT is normal.  Neck is supple.  Chest is clear to auscultation with normal expansion.  Cardiovascular exam is regular rate and rhythm.  Abdominal exam nontender or distended. No masses palpated. Positive bruit Extremities show no edema. neuro grossly intact

## 2015-08-24 ENCOUNTER — Ambulatory Visit (HOSPITAL_COMMUNITY)
Admission: RE | Admit: 2015-08-24 | Discharge: 2015-08-24 | Disposition: A | Discharge status: Home / Self Care | Payer: BLUE CROSS/BLUE SHIELD | Source: Ambulatory Visit | Attending: Cardiology | Admitting: Cardiology

## 2015-08-24 DIAGNOSIS — R0989 Other specified symptoms and signs involving the circulatory and respiratory systems: Secondary | ICD-10-CM | POA: Insufficient documentation

## 2015-10-22 LAB — LIPID PANEL
CHOL/HDL RATIO: 3 ratio (ref <=5.0)
Cholesterol: 92 mg/dL — ABNORMAL LOW (ref 125–200)
HDL: 31 mg/dL — AB (ref >=40)
LDL Cholesterol: 49 mg/dL (ref <130)
TRIGLYCERIDES: 62 mg/dL (ref <150)
VLDL: 12 mg/dL (ref <30)

## 2015-10-22 LAB — HEPATIC FUNCTION PANEL
ALBUMIN: 3.9 g/dL (ref 3.6–5.1)
ALT: 46 U/L (ref 9–46)
AST: 33 U/L (ref 10–35)
Alkaline Phosphatase: 43 U/L (ref 40–115)
BILIRUBIN TOTAL: 0.7 mg/dL (ref 0.2–1.2)
Bilirubin, Direct: 0.2 mg/dL (ref <=0.2)
Indirect Bilirubin: 0.5 mg/dL (ref 0.2–1.2)
Total Protein: 6.7 g/dL (ref 6.1–8.1)

## 2015-10-24 ENCOUNTER — Encounter: Payer: Self-pay | Admitting: *Deleted

## 2015-10-26 ENCOUNTER — Other Ambulatory Visit: Payer: Self-pay | Admitting: Physician Assistant

## 2015-10-26 NOTE — Telephone Encounter (Signed)
Rx(s) sent to pharmacy electronically.  

## 2016-02-13 ENCOUNTER — Other Ambulatory Visit: Payer: Self-pay | Admitting: Physician Assistant

## 2016-02-27 NOTE — Progress Notes (Signed)
      HPI: FU CAD. Patient had cardiac catheterization June 2016. There was a 100% second marginal and a 90% first marginal. He had drug-eluting stents to both lesions. There was no obstructive disease in the right coronary artery or LAD. LV function was normal. Abdominal ultrasound 12/16 showed no aneurysm. Since last seen, the patient has dyspnea with more extreme activities but not with routine activities. It is relieved with rest. It is not associated with chest pain. There is no orthopnea, PND or pedal edema. There is no syncope or palpitations. There is no exertional chest pain.   Current Outpatient Prescriptions  Medication Sig Dispense Refill  . amLODipine (NORVASC) 5 MG tablet TAKE 1 TABLET(5 MG) BY MOUTH DAILY 30 tablet 1  . aspirin 81 MG tablet Take 81 mg by mouth daily.    . metoprolol tartrate (LOPRESSOR) 25 MG tablet TAKE 1/2 TABLET(12.5 MG) BY MOUTH TWICE DAILY 60 tablet 3  . nitroGLYCERIN (NITROSTAT) 0.4 MG SL tablet Place 1 tablet (0.4 mg total) under the tongue every 5 (five) minutes x 3 doses as needed for chest pain. 25 tablet 12  . rosuvastatin (CRESTOR) 40 MG tablet Take 1 tablet (40 mg total) by mouth daily. 90 tablet 3  . ticagrelor (BRILINTA) 90 MG TABS tablet Take 1 tablet (90 mg total) by mouth 2 (two) times daily. 60 tablet 11   No current facility-administered medications for this visit.     Past Medical History  Diagnosis Date  . GERD 04/26/2008  . Coronary artery disease   . Myocardial infarction [facility](HCC)     Past Surgical History  Procedure Laterality Date  . Cardiac catheterization N/A 01/31/2015    Procedure: Left Heart Cath and Coronary Angiography;  Surgeon: Tonny BollmanMichael Cooper, MD;  [facility];  Service: Cardiovascular;  Laterality: N/A;  . Percutaneous coronary stent intervention (pci-s)  01/2015    LEFT CIRCUMFLEX  1ST & 2ND   OM BRANCHES    Social History   Social History  . Marital Status: Married    Spouse Name: N/A  . Number of  Children: N/A  . Years of Education: N/A   Occupational History  . Not on file.   Social History Main Topics  . Smoking status: Never Smoker   . Smokeless tobacco: Never Used  . Alcohol Use: No  . Drug Use: No  . Sexual Activity: Not on file   Other Topics Concern  . Not on file   Social History Narrative    Family History  Problem Relation Age of Onset  . Family history unknown: Yes    ROS: no fevers or chills, productive cough, hemoptysis, dysphasia, odynophagia, melena, hematochezia, dysuria, hematuria, rash, seizure activity, orthopnea, PND, pedal edema, claudication. Remaining systems are negative.  Physical Exam: Well-developed well-nourished in no acute distress.  Skin is warm and dry.  HEENT is normal.  Neck is supple.  Chest is clear to auscultation with normal expansion.  Cardiovascular exam is regular rate and rhythm.  Abdominal exam nontender or distended. No masses palpated. Extremities show no edema. neuro grossly intact  ECG Sinus bradycardia at a rate of 49. RV conduction delay.  Assessment and plan 1 hypertension-blood pressure controlled. However mildly bradycardic. Also complains of erectile dysfunction. Discontinue metoprolol. 2 hyperlipidemia-continue statin. 3 coronary artery disease-Continue aspirin and statin. Discontinue brilinta.  Olga MillersBrian Thy Gullikson, MD

## 2016-03-02 ENCOUNTER — Encounter: Payer: Self-pay | Admitting: Cardiology

## 2016-03-02 ENCOUNTER — Ambulatory Visit (INDEPENDENT_AMBULATORY_CARE_PROVIDER_SITE_OTHER): Payer: BLUE CROSS/BLUE SHIELD | Admitting: Cardiology

## 2016-03-02 VITALS — BP 108/76 | HR 49 | Ht 68.0 in | Wt 218.0 lb

## 2016-03-02 DIAGNOSIS — E785 Hyperlipidemia, unspecified: Secondary | ICD-10-CM | POA: Diagnosis not present

## 2016-03-02 DIAGNOSIS — I2510 Atherosclerotic heart disease of native coronary artery without angina pectoris: Secondary | ICD-10-CM

## 2016-03-02 DIAGNOSIS — I10 Essential (primary) hypertension: Secondary | ICD-10-CM

## 2016-03-02 NOTE — Patient Instructions (Signed)
Your physician wants you to follow-up in: 1 year with Dr. Jens Somrenshaw. You will receive a reminder letter in the mail two months in advance. If you don't receive a letter, please call our office to schedule the follow-up appointment.  Your physician has recommended you make the following change in your medication: STOP Brilinta and Metoprolol.   If you need a refill on your cardiac medications before your next appointment, please call your pharmacy.

## 2016-05-01 ENCOUNTER — Other Ambulatory Visit: Payer: Self-pay | Admitting: Cardiology

## 2016-05-01 MED ORDER — AMLODIPINE BESYLATE 5 MG PO TABS: 5.0000 mg | ORAL_TABLET | Freq: Every day | ORAL | 11 refills | Status: DC

## 2016-12-08 ENCOUNTER — Other Ambulatory Visit: Payer: Self-pay | Admitting: Cardiology

## 2016-12-08 DIAGNOSIS — E785 Hyperlipidemia, unspecified: Secondary | ICD-10-CM

## 2016-12-10 NOTE — Telephone Encounter (Signed)
Rx(s) sent to pharmacy electronically.  

## 2017-03-15 ENCOUNTER — Other Ambulatory Visit: Payer: Self-pay | Admitting: Cardiology

## 2017-03-15 DIAGNOSIS — E785 Hyperlipidemia, unspecified: Secondary | ICD-10-CM

## 2017-04-29 ENCOUNTER — Other Ambulatory Visit: Payer: Self-pay | Admitting: Cardiology

## 2017-04-30 NOTE — Telephone Encounter (Signed)
REFILL 

## 2017-05-06 NOTE — Progress Notes (Signed)
      HPI: FU CAD. Patient had cardiac catheterization June 2016. There was a 100% second marginal and a 90% first marginal. He had drug-eluting stents to both lesions. There was no obstructive disease in the right coronary artery or LAD. LV function was normal. Abdominal ultrasound 12/16 showed no aneurysm. Since last seen, the patient denies any dyspnea on exertion, orthopnea, PND, pedal edema, palpitations, syncope or chest pain.   Current Outpatient Prescriptions  Medication Sig Dispense Refill  . amLODipine (NORVASC) 5 MG tablet Take 1 tablet (5 mg total) by mouth daily. KEEP OV. 30 tablet 0  . aspirin 81 MG tablet Take 81 mg by mouth daily.    . nitroGLYCERIN (NITROSTAT) 0.4 MG SL tablet Place 1 tablet (0.4 mg total) under the tongue every 5 (five) minutes x 3 doses as needed for chest pain. 25 tablet 12  . rosuvastatin (CRESTOR) 40 MG tablet TAKE 1 TABLET(40 MG) BY MOUTH DAILY 90 tablet 0   No current facility-administered medications for this visit.      Past Medical History:  Diagnosis Date  . Coronary artery disease   . GERD 04/26/2008  . Myocardial infarction [facility](HCC)     Past Surgical History:  Procedure Laterality Date  . CARDIAC CATHETERIZATION N/A 01/31/2015   Procedure: Left Heart Cath and Coronary Angiography;  Surgeon: Tonny BollmanMichael Cooper, MD;  [facility];  Service: Cardiovascular;  Laterality: N/A;  . PERCUTANEOUS CORONARY STENT INTERVENTION (PCI-S)  01/2015   LEFT CIRCUMFLEX  1ST & 2ND   [facility] BRANCHES    Social History   Social History  . Marital status: Married    Spouse name: N/A  . Number of children: N/A  . Years of education: N/A   Occupational History  . Not on file.   Social History Main Topics  . Smoking status: Never Smoker  . Smokeless tobacco: Never Used  . Alcohol use No  . Drug use: No  . Sexual activity: Not on file   Other Topics Concern  . Not on file   Social History Narrative  . No narrative on file    Family History    Problem Relation Age of Onset  . Family history unknown: Yes    ROS: no fevers or chills, productive cough, hemoptysis, dysphasia, odynophagia, melena, hematochezia, dysuria, hematuria, rash, seizure activity, orthopnea, PND, pedal edema, claudication. Remaining systems are negative.  Physical Exam: Well-developed well-nourished in no acute distress.  Skin is warm and dry.  HEENT is normal.  Neck is supple.  Chest is clear to auscultation with normal expansion.  Cardiovascular exam is regular rate and rhythm.  Abdominal exam nontender or distended. No masses palpated. Extremities show no edema. neuro grossly intact  ECG- sinus rhythm at a rate of 70. No ST changes. personally reviewed  A/P  1 coronary artery disease-patient doing well with no chest pain. Continue aspirin, statin and lifestyle modification.  2 hypertension-blood pressure is controlled. Continue present regimen.  3 hyperlipidemia-continue statin. Check lipids and liver.  4 Erectile dysfunction-pt inquired about viagra. I instructed him to never mix with NTG within 24 hrs. Otherwise I have no objection to this medication.  Olga MillersBrian Ewa Hipp, MD

## 2017-05-10 ENCOUNTER — Encounter: Payer: Self-pay | Admitting: Cardiology

## 2017-05-10 ENCOUNTER — Ambulatory Visit (INDEPENDENT_AMBULATORY_CARE_PROVIDER_SITE_OTHER): Payer: Self-pay | Admitting: Cardiology

## 2017-05-10 VITALS — BP 126/82 | HR 70 | Ht 68.0 in | Wt 219.0 lb

## 2017-05-10 DIAGNOSIS — E7800 Pure hypercholesterolemia, unspecified: Secondary | ICD-10-CM

## 2017-05-10 DIAGNOSIS — I2510 Atherosclerotic heart disease of native coronary artery without angina pectoris: Secondary | ICD-10-CM

## 2017-05-10 DIAGNOSIS — I10 Essential (primary) hypertension: Secondary | ICD-10-CM

## 2017-05-10 LAB — LIPID PANEL
CHOL/HDL RATIO: 2.6 ratio (ref 0.0–5.0)
Cholesterol, Total: 95 mg/dL — ABNORMAL LOW (ref 100–199)
HDL: 37 mg/dL — ABNORMAL LOW (ref >39)
LDL CALC: 42 mg/dL (ref 0–99)
TRIGLYCERIDES: 82 mg/dL (ref 0–149)
VLDL Cholesterol Cal: 16 mg/dL (ref 5–40)

## 2017-05-10 LAB — HEPATIC FUNCTION PANEL
ALBUMIN: 4.3 g/dL (ref 3.6–4.8)
ALT: 20 IU/L (ref 0–44)
AST: 22 IU/L (ref 0–40)
Alkaline Phosphatase: 42 IU/L (ref 39–117)
Bilirubin Total: 1 mg/dL (ref 0.0–1.2)
Bilirubin, Direct: 0.23 mg/dL (ref 0.00–0.40)
Total Protein: 6.9 g/dL (ref 6.0–8.5)

## 2017-05-10 NOTE — Patient Instructions (Signed)
Medication Instructions:   NO CHANGE  Labwork:  Your physician recommends that you HAVE LAB WORK TODAY  Follow-Up:  Your physician wants you to follow-up in: ONE YEAR WITH DR CRENSHAW You will receive a reminder letter in the mail two months in advance. If you don't receive a letter, please call our office to schedule the follow-up appointment.   If you need a refill on your cardiac medications before your next appointment, please call your pharmacy.    

## 2017-05-14 ENCOUNTER — Encounter: Payer: Self-pay | Admitting: *Deleted

## 2017-06-08 ENCOUNTER — Other Ambulatory Visit: Payer: Self-pay | Admitting: Cardiology

## 2017-06-10 NOTE — Telephone Encounter (Signed)
REFILL 

## 2017-06-27 ENCOUNTER — Other Ambulatory Visit: Payer: Self-pay | Admitting: Cardiology

## 2017-06-27 DIAGNOSIS — E785 Hyperlipidemia, unspecified: Secondary | ICD-10-CM

## 2017-07-30 ENCOUNTER — Encounter: Payer: Self-pay | Admitting: Internal Medicine

## 2017-07-30 ENCOUNTER — Ambulatory Visit (INDEPENDENT_AMBULATORY_CARE_PROVIDER_SITE_OTHER): Payer: Self-pay | Admitting: Internal Medicine

## 2017-07-30 VITALS — BP 128/76 | HR 74 | Temp 98.5°F | Resp 16 | Wt 219.8 lb

## 2017-07-30 DIAGNOSIS — J069 Acute upper respiratory infection, unspecified: Secondary | ICD-10-CM

## 2017-07-30 DIAGNOSIS — B9789 Other viral agents as the cause of diseases classified elsewhere: Secondary | ICD-10-CM

## 2017-07-30 DIAGNOSIS — I2510 Atherosclerotic heart disease of native coronary artery without angina pectoris: Secondary | ICD-10-CM

## 2017-07-30 DIAGNOSIS — I10 Essential (primary) hypertension: Secondary | ICD-10-CM

## 2017-07-30 NOTE — Patient Instructions (Addendum)
Acute bronchitis symptoms are generally not helped by antibiotics.  Take over-the-counter expectorants and cough medications such as  Mucinex DM.  Call if there is no improvement in 5 to 7 days or if  you develop worsening cough, fever, or new symptoms, such as shortness of breath or chest pain.  Hydrate and Humidify  Drink enough water to keep your urine clear or pale yellow. Staying hydrated will help to thin your mucus.  Use a cool mist humidifier to keep the humidity level in your home above 50%.  Inhale steam for 10-15 minutes, 3-4 times a day or as told by your health care provider. You can do this in the bathroom while a hot shower is running.  Limit your exposure to cool or dry air. Rest  Rest as much as possible.  

## 2017-07-30 NOTE — Progress Notes (Signed)
Subjective:    Patient ID: Raymond Norris, male    DOB: 08/21/1951, 66 y.o.   MRN: [id number]  HPI  66 year old patient who has a history of coronary artery disease and followed by cardiology  For the past 3 weeks he has had some head and chest congestion postnasal drip and cough.  No fever he does have a granddaughter who has improved from a similar illness. Denies any wheezing shortness of breath or exertional chest pain  Has not been seen by me in about 3 years  Past Medical History:  Diagnosis Date  . Coronary artery disease   . GERD 04/26/2008  . Myocardial infarction [facility](HCC)      Social History   Socioeconomic History  . Marital status: Married    Spouse name: Not on file  . Number of children: Not on file  . Years of education: Not on file  . Highest education level: Not on file  Social Needs  . Financial resource strain: Not on file  . Food insecurity - worry: Not on file  . Food insecurity - inability: Not on file  . Transportation needs - medical: Not on file  . Transportation needs - non-medical: Not on file  Occupational History  . Not on file  Tobacco Use  . Smoking status: Never Smoker  . Smokeless tobacco: Never Used  Substance and Sexual Activity  . Alcohol use: No  . Drug use: No  . Sexual activity: Not on file  Other Topics Concern  . Not on file  Social History Narrative  . Not on file    Past Surgical History:  Procedure Laterality Date  . CARDIAC CATHETERIZATION N/A 01/31/2015   Procedure: Left Heart Cath and Coronary Angiography;  Surgeon: Tonny BollmanMichael Cooper, MD;  [facility];  Service: Cardiovascular;  Laterality: N/A;  . PERCUTANEOUS CORONARY STENT INTERVENTION (PCI-S)  01/2015   LEFT CIRCUMFLEX  1ST & 2ND   [facility] BRANCHES    Family History  Family history unknown: Yes    No Known Allergies  Current Outpatient Medications on File Prior to Visit  Medication Sig Dispense Refill  . amLODipine (NORVASC) 5 MG tablet TAKE 1  TABLET BY MOUTH DAILY(KEEP OFFICE VISIT) 30 tablet 11  . aspirin 81 MG tablet Take 81 mg by mouth daily.    . rosuvastatin (CRESTOR) 40 MG tablet TAKE 1 TABLET BY MOUTH DAILY 90 tablet 2  . nitroGLYCERIN (NITROSTAT) 0.4 MG SL tablet Place 1 tablet (0.4 mg total) under the tongue every 5 (five) minutes x 3 doses as needed for chest pain. (Patient not taking: Reported on 07/30/2017) 25 tablet 12   No current facility-administered medications on file prior to visit.     BP 128/76 (BP Location: Left Arm, Patient Position: Sitting, Cuff Size: Large)   Pulse 74   Temp 98.5 F (36.9 C) (Oral)   Resp 16   Wt 219 lb 12.8 oz (99.7 kg)   SpO2 98%   BMI 33.42 kg/m     Review of Systems  Constitutional: Positive for activity change, appetite change and fatigue. Negative for chills and fever.  HENT: Positive for congestion, postnasal drip and rhinorrhea. Negative for dental problem, ear pain, hearing loss, sore throat, tinnitus, trouble swallowing and voice change.   Eyes: Negative for pain, discharge and visual disturbance.  Respiratory: Positive for cough. Negative for chest tightness, wheezing and stridor.   Cardiovascular: Negative for chest pain, palpitations and leg swelling.  Gastrointestinal: Negative for abdominal distention,  abdominal pain, blood in stool, constipation, diarrhea, nausea and vomiting.  Genitourinary: Negative for difficulty urinating, discharge, flank pain, genital sores, hematuria and urgency.  Musculoskeletal: Negative for arthralgias, back pain, gait problem, joint swelling, myalgias and neck stiffness.  Skin: Negative for rash.  Neurological: Negative for dizziness, syncope, speech difficulty, weakness, numbness and headaches.  Hematological: Negative for adenopathy. Does not bruise/bleed easily.  Psychiatric/Behavioral: Negative for behavioral problems and dysphoric mood. The patient is not nervous/anxious.        Objective:   Physical Exam  Constitutional: He is  oriented to person, place, and time. He appears well-developed.  HENT:  Head: Normocephalic.  Right Ear: External ear normal.  Left Ear: External ear normal.  Eyes: Conjunctivae and EOM are normal.  Neck: Normal range of motion.  Cardiovascular: Normal rate and normal heart sounds.  Pulmonary/Chest: Effort normal and breath sounds normal. No respiratory distress. He has no wheezes. He has no rales.  Abdominal: Bowel sounds are normal.  Musculoskeletal: Normal range of motion. He exhibits no edema or tenderness.  Neurological: He is alert and oriented to person, place, and time.  Psychiatric: He has a normal mood and affect. His behavior is normal.          Assessment & Plan:  Viral URI with cough.  Will treat symptomatically  Coronary artery disease stable   Schedule CPX  KWIATKOWSKI,PETER Homero FellersFRANK

## 2017-08-02 ENCOUNTER — Telehealth: Payer: Self-pay | Admitting: Internal Medicine

## 2017-08-02 NOTE — Telephone Encounter (Signed)
Copied from CRM [id number]. Topic: Inquiry >> Aug 02, 2017  9:39 AM Raquel SarnaHayes, Teresa G wrote: Pt saw Dr. Kirtland BouchardK. Rica Moteues.  Pt needs to know if he needs to see Dr. Kirtland BouchardK. again or can dr call him in something for his fever that comes and goes since Tues. Visit.  Please call pt back asap.

## 2017-08-03 ENCOUNTER — Other Ambulatory Visit: Payer: Self-pay

## 2017-08-03 ENCOUNTER — Emergency Department (HOSPITAL_COMMUNITY): Payer: Medicare Other

## 2017-08-03 ENCOUNTER — Encounter (HOSPITAL_COMMUNITY): Payer: Self-pay | Admitting: Emergency Medicine

## 2017-08-03 ENCOUNTER — Emergency Department (HOSPITAL_COMMUNITY)
Admission: EM | Admit: 2017-08-03 | Discharge: 2017-08-03 | Disposition: A | Discharge status: Home / Self Care | Payer: Medicare Other | Attending: Emergency Medicine | Admitting: Emergency Medicine

## 2017-08-03 DIAGNOSIS — B9789 Other viral agents as the cause of diseases classified elsewhere: Secondary | ICD-10-CM

## 2017-08-03 DIAGNOSIS — J069 Acute upper respiratory infection, unspecified: Secondary | ICD-10-CM | POA: Diagnosis not present

## 2017-08-03 DIAGNOSIS — I10 Essential (primary) hypertension: Secondary | ICD-10-CM | POA: Insufficient documentation

## 2017-08-03 DIAGNOSIS — Z7982 Long term (current) use of aspirin: Secondary | ICD-10-CM | POA: Diagnosis not present

## 2017-08-03 DIAGNOSIS — I2510 Atherosclerotic heart disease of native coronary artery without angina pectoris: Secondary | ICD-10-CM | POA: Insufficient documentation

## 2017-08-03 DIAGNOSIS — Z79899 Other long term (current) drug therapy: Secondary | ICD-10-CM | POA: Diagnosis not present

## 2017-08-03 DIAGNOSIS — I252 Old myocardial infarction: Secondary | ICD-10-CM | POA: Diagnosis not present

## 2017-08-03 DIAGNOSIS — R05 Cough: Secondary | ICD-10-CM | POA: Diagnosis present

## 2017-08-03 MED ORDER — BENZONATATE 100 MG PO CAPS: 100.0000 mg | ORAL_CAPSULE | Freq: Three times a day (TID) | ORAL | 0 refills | Status: DC

## 2017-08-03 MED ORDER — FLUTICASONE PROPIONATE 50 MCG/ACT NA SUSP: 1.0000 | Freq: Every day | NASAL | 0 refills | Status: DC

## 2017-08-03 NOTE — ED Provider Notes (Signed)
[facility] Provider Note   CSN: [id number] Arrival date & time: 08/03/17  0708  History   Chief Complaint Chief Complaint  Patient presents with  . Cough  . URI   HPI Raymond Norris is a 66 y.o. male with a PMHx significant for CAD and GERD who presented to the ED with 3 days of nasal congestion, cough, and fevers. He states that his symptoms began with nasal congestion and cough on 12/5, he subsequently developed fevers. He has been taking Mucinex DM with some relief but otherwise has not tried anything. He acknowledges recent sick contact, stating that his grandchild was ill with fevers and rhinorrhea 2 days prior to the start of his symptoms. Associated symptoms include headaches, abdominal pain, myalgias, and arthralgias. He denies a history of structural lung disease. Denies tobacco use, EtOH use, or IV drug use.   Past Medical History:  Diagnosis Date  . Coronary artery disease   . GERD 04/26/2008  . Myocardial infarction [facility](HCC)    Patient Active Problem List   Diagnosis Date Noted  . Bruit 08/04/2015  . Dyslipidemia 03/02/2015  . NSTEMI (non-ST elevated myocardial infarction) (HCC) 01/31/2015  . Unstable angina (HCC) 01/31/2015  . Benign essential HTN 01/31/2015  . CAD (coronary artery disease) 01/31/2015  . GERD 04/26/2008   Past Surgical History:  Procedure Laterality Date  . CARDIAC CATHETERIZATION N/A 01/31/2015   Procedure: Left Heart Cath and Coronary Angiography;  Surgeon: Tonny BollmanMichael Cooper, MD;  [facility];  Service: Cardiovascular;  Laterality: N/A;  . PERCUTANEOUS CORONARY STENT INTERVENTION (PCI-S)  01/2015   LEFT CIRCUMFLEX  1ST & 2ND   OM BRANCHES    Home Medications    Prior to Admission medications   Medication Sig Start Date End Date Taking? Authorizing Provider  amLODipine (NORVASC) 5 MG tablet TAKE 1 TABLET BY MOUTH DAILY(KEEP OFFICE VISIT) 06/10/17   Lewayne Buntingrenshaw, Brian S, MD  aspirin 81 MG tablet  Take 81 mg by mouth daily.    [provider]  benzonatate (TESSALON) 100 MG capsule Take 1 capsule (100 mg total) by mouth every 8 (eight) hours. 08/03/17   Levora DredgeHelberg, Ranette Luckadoo, MD  fluticasone (FLONASE) 50 MCG/ACT nasal spray Place 1 spray into both nostrils daily. 08/03/17 09/02/17  Levora DredgeHelberg, Harshika Mago, MD  nitroGLYCERIN (NITROSTAT) 0.4 MG SL tablet Place 1 tablet (0.4 mg total) under the tongue every 5 (five) minutes x 3 doses as needed for chest pain. Patient not taking: Reported on 07/30/2017 02/01/15   Dwana MelenaHager, Bryan W, PA-C  rosuvastatin (CRESTOR) 40 MG tablet TAKE 1 TABLET BY MOUTH DAILY 06/28/17   Lewayne Buntingrenshaw, Brian S, MD   Family History Family History  Family history unknown: Yes   Social History Social History   Tobacco Use  . Smoking status: Never Smoker  . Smokeless tobacco: Never Used  Substance Use Topics  . Alcohol use: No  . Drug use: No   Allergies   Patient has no known allergies.  Review of Systems  All systems reviewed and are negative for acute change except as noted in the HPI.  Physical Exam Updated Vital Signs BP (!) 159/94 (BP Location: Right Arm)   Pulse (!) 101   Temp 98.3 F (36.8 C) (Oral)   Resp 18   Ht 5\' 8"  (1.727 m)   Wt 99.3 kg (219 lb)   SpO2 98%   BMI 33.30 kg/m   General: Well nourished male in no acute distress HENT: Moist mucus membranes with  a clear oropharynx. No posterior or anterior auricular LAD. No cervical or supraclavicular LAD.  Pulm: Good air movement with no wheezing or crackles  CV: RRR, prominent S1 and S2 with no murmurs or rubs  Abdomen: Active bowel sounds, soft, non-distended, no tenderness to palpation  Extremities: No LE edema, pulses palpable  Skin: No new rashes or lesions, warm and dry  Neuro: Alert and oriented x 3  ED Treatments / Results  Labs (all labs ordered are listed, but only abnormal results are displayed) Labs Reviewed - No data to display  EKG  EKG Interpretation None      Radiology Dg Chest  2 View  Result Date: 08/03/2017 CLINICAL DATA:  Pt states 3 weeks of cough with clear mucous, post nasal drip. Pt also states congestion. Fevers intermittently. Denies CP and SOB EXAM: CHEST  2 VIEW COMPARISON:  01/31/2015 FINDINGS: Cardiac silhouette is normal in size and configuration. Normal mediastinal and hilar contours. Lungs are clear.  No pleural effusion or pneumothorax. Skeletal structures are intact. IMPRESSION: No active cardiopulmonary disease. Electronically Signed   By: Amie Portlandavid  Ormond M.D.   On: 08/03/2017 08:18   Procedures Procedures (including critical care time)  Medications Ordered in ED Medications - No data to display  Initial Impression / Assessment and Plan / ED Course  I have reviewed the triage vital signs and the nursing notes.  Pertinent labs & imaging results that were available during my care of the patient were reviewed by me and considered in my medical decision making (see chart for details).    Patient presented with signs and symptoms of a URI with recent sick contact. He has no isolated sinus tenderness to make me suspect this is a sinusitis. CXR was clear without signs of PNA. He is otherwise healthy and denies a history of structural lung disease. He is hemodynamically stable. We discussed the plan to discharge with Flonase and tessalon perils for his cough. He agrees with the plan. He will follow-up with his PCP if his symptoms do not improve. He was discharged in stable condition with standard return precautions.   Final Clinical Impressions(s) / ED Diagnoses   Final diagnoses:  Viral URI with cough   ED Discharge Orders        Ordered    fluticasone (FLONASE) 50 MCG/ACT nasal spray  Daily     08/03/17 0839    benzonatate (TESSALON) 100 MG capsule  Every 8 hours     08/03/17 0839       Levora DredgeHelberg, Sarahi Borland, MD 08/03/17 [id number]    Charlynne PanderYao, David Hsienta, MD 08/04/17 [phone]

## 2017-08-03 NOTE — Discharge Instructions (Signed)
Please follow up with your primary care doctor as soon as possible.

## 2017-08-03 NOTE — ED Triage Notes (Signed)
Pt states 3 weeks of cough with clear mucous, post nasal drip. Pt also states congestion. Fevers intermittently. Afebrile at present. Pt unsure of numbers, didn't check it but states it was "probably medium high." Pt taking mucinex for symptoms, it has helped the cough.

## 2017-08-03 NOTE — ED Notes (Signed)
Declined W/C at D/C and was escorted to lobby by RN. 

## 2017-08-05 NOTE — Telephone Encounter (Signed)
Patient seen and treated in ED on 08/03/17 for same symptoms.

## 2017-08-10 ENCOUNTER — Other Ambulatory Visit: Payer: Self-pay | Admitting: Internal Medicine

## 2017-08-12 ENCOUNTER — Telehealth: Payer: Self-pay | Admitting: Family Medicine

## 2017-08-12 NOTE — Telephone Encounter (Signed)
Copied from CRM #[id number]. Topic: General - Other >> Aug 09, 2017  4:40 PM Terisa Starraylor, Brittany L wrote: Reason for CRM:  Pts wife called and stated that she called last week for someone to call her back, she says her husband now having a burning sensation after she eats. She would like a nurse to call her back. Please call him back @ [phone].  I spoke with pt and he is on the schedule for 08/14/2017 at 11 am with Dr. Kirtland BouchardK. Pt complained of heartburn after eating, no chest pain or any other symptom.

## 2017-08-14 ENCOUNTER — Encounter: Payer: Self-pay | Admitting: Internal Medicine

## 2017-08-14 ENCOUNTER — Ambulatory Visit (INDEPENDENT_AMBULATORY_CARE_PROVIDER_SITE_OTHER): Payer: Self-pay | Admitting: Internal Medicine

## 2017-08-14 VITALS — BP 130/70 | HR 77 | Temp 98.5°F | Ht 68.0 in | Wt 210.8 lb

## 2017-08-14 DIAGNOSIS — I10 Essential (primary) hypertension: Secondary | ICD-10-CM

## 2017-08-14 DIAGNOSIS — K219 Gastro-esophageal reflux disease without esophagitis: Secondary | ICD-10-CM

## 2017-08-14 DIAGNOSIS — I2510 Atherosclerotic heart disease of native coronary artery without angina pectoris: Secondary | ICD-10-CM

## 2017-08-14 MED ORDER — PANTOPRAZOLE SODIUM 40 MG PO TBEC: 40.0000 mg | DELAYED_RELEASE_TABLET | Freq: Every day | ORAL | 3 refills | Status: DC

## 2017-08-14 NOTE — Patient Instructions (Addendum)
Protonix 1 tablet daily for 30 days  Avoids foods high in acid such as tomatoes citrus juices, and spicy foods.  Avoid eating within two hours of lying down or before exercising.  Do not overheat.  Try smaller more frequent meals.  If symptoms persist, please notify our office.   Food Choices for Gastroesophageal Reflux Disease, Adult When you have gastroesophageal reflux disease (GERD), the foods you eat and your eating habits are very important. Choosing the right foods can help ease the discomfort of GERD. Consider working with a diet and nutrition specialist (dietitian) to help you make healthy food choices. What general guidelines should I follow? Eating plan  Choose healthy foods low in fat, such as fruits, vegetables, whole grains, low-fat dairy products, and lean meat, fish, and poultry.  Eat frequent, small meals instead of three large meals each day. Eat your meals slowly, in a relaxed setting. Avoid bending over or lying down until 2-3 hours after eating.  Limit high-fat foods such as fatty meats or fried foods.  Limit your intake of oils, butter, and shortening to less than 8 teaspoons each day.  Avoid the following: ? Foods that cause symptoms. These may be different for different people. Keep a food diary to keep track of foods that cause symptoms. ? Alcohol. ? Drinking large amounts of liquid with meals. ? Eating meals during the 2-3 hours before bed.  Cook foods using methods other than frying. This may include baking, grilling, or broiling. Lifestyle   Maintain a healthy weight. Ask your health care provider what weight is healthy for you. If you need to lose weight, work with your health care provider to do so safely.  Exercise for at least 30 minutes on 5 or more days each week, or as told by your health care provider.  Avoid wearing clothes that fit tightly around your waist and chest.  Do not use any products that contain nicotine or tobacco, such as cigarettes  and e-cigarettes. If you need help quitting, ask your health care provider.  Sleep with the head of your bed raised. Use a wedge under the mattress or blocks under the bed frame to raise the head of the bed. What foods are not recommended? The items listed may not be a complete list. Talk with your dietitian about what dietary choices are best for you. Grains Pastries or quick breads with added fat. [place] toast. Vegetables Deep fried vegetables. [place] fries. Any vegetables prepared with added fat. Any vegetables that cause symptoms. For some people this may include tomatoes and tomato products, chili peppers, onions and garlic, and horseradish. Fruits Any fruits prepared with added fat. Any fruits that cause symptoms. For some people this may include citrus fruits, such as oranges, grapefruit, pineapple, and lemons. Meats and other protein foods High-fat meats, such as fatty beef or pork, hot dogs, ribs, ham, sausage, salami and bacon. Fried meat or protein, including fried fish and fried chicken. Nuts and nut butters. Dairy Whole milk and chocolate milk. Sour cream. Cream. Ice cream. Cream cheese. Milk shakes. Beverages Coffee and tea, with or without caffeine. Carbonated beverages. Sodas. Energy drinks. Fruit juice made with acidic fruits (such as orange or grapefruit). Tomato juice. Alcoholic drinks. Fats and oils Butter. Margarine. Shortening. Ghee. Sweets and desserts Chocolate and cocoa. Donuts. Seasoning and other foods Pepper. Peppermint and spearmint. Any condiments, herbs, or seasonings that cause symptoms. For some people, this may include curry, hot sauce, or vinegar-based salad dressings. Summary  When you  have gastroesophageal reflux disease (GERD), food and lifestyle choices are very important to help ease the discomfort of GERD.  Eat frequent, small meals instead of three large meals each day. Eat your meals slowly, in a relaxed setting. Avoid bending over or lying down  until 2-3 hours after eating.  Limit high-fat foods such as fatty meat or fried foods.   Call or return to clinic prn if these symptoms worsen or fail to improve as anticipated.

## 2017-08-14 NOTE — Progress Notes (Signed)
Subjective:    Patient ID: Raymond Norris, male    DOB: 01/10/1951, 66 y.o.   MRN: [id number]  HPI 66 year old patient has a history of coronary artery disease.  For the past several days he has had some excessive belching and gaseousness and some reflux. He states that he has had some reflux issues in the past.  He has taken no recent medications.  Wt Readings from Last 3 Encounters:  08/14/17 210 lb 12.8 oz (95.6 kg)  08/03/17 219 lb (99.3 kg)  07/30/17 219 lb 12.8 oz (99.7 kg)    Past Medical History:  Diagnosis Date  . Coronary artery disease   . GERD 04/26/2008  . Myocardial infarction [facility](HCC)      Social History   Socioeconomic History  . Marital status: Married    Spouse name: Not on file  . Number of children: Not on file  . Years of education: Not on file  . Highest education level: Not on file  Social Needs  . Financial resource strain: Not on file  . Food insecurity - worry: Not on file  . Food insecurity - inability: Not on file  . Transportation needs - medical: Not on file  . Transportation needs - non-medical: Not on file  Occupational History  . Not on file  Tobacco Use  . Smoking status: Never Smoker  . Smokeless tobacco: Never Used  Substance and Sexual Activity  . Alcohol use: No  . Drug use: No  . Sexual activity: Not on file  Other Topics Concern  . Not on file  Social History Narrative  . Not on file    Past Surgical History:  Procedure Laterality Date  . CARDIAC CATHETERIZATION N/A 01/31/2015   Procedure: Left Heart Cath and Coronary Angiography;  Surgeon: Tonny BollmanMichael Cooper, MD;  [facility];  Service: Cardiovascular;  Laterality: N/A;  . PERCUTANEOUS CORONARY STENT INTERVENTION (PCI-S)  01/2015   LEFT CIRCUMFLEX  1ST & 2ND   OM BRANCHES    Family History  Family history unknown: Yes    No Known Allergies  Current Outpatient Medications on File Prior to Visit  Medication Sig Dispense Refill  . amLODipine (NORVASC) 5 MG  tablet TAKE 1 TABLET BY MOUTH DAILY(KEEP OFFICE VISIT) 30 tablet 11  . aspirin 81 MG tablet Take 81 mg by mouth daily.    . fluticasone (FLONASE) 50 MCG/ACT nasal spray Place 1 spray into both nostrils daily. 1 g 0  . nitroGLYCERIN (NITROSTAT) 0.4 MG SL tablet Place 1 tablet (0.4 mg total) under the tongue every 5 (five) minutes x 3 doses as needed for chest pain. 25 tablet 12  . rosuvastatin (CRESTOR) 40 MG tablet TAKE 1 TABLET BY MOUTH DAILY 90 tablet 2  . benzonatate (TESSALON) 100 MG capsule Take 1 capsule (100 mg total) by mouth every 8 (eight) hours. (Patient not taking: Reported on 08/14/2017) 21 capsule 0   No current facility-administered medications on file prior to visit.     BP 130/70 (BP Location: Left Arm, Patient Position: Sitting, Cuff Size: Normal)   Pulse 77   Temp 98.5 F (36.9 C) (Oral)   Ht 5\' 8"  (1.727 m)   Wt 210 lb 12.8 oz (95.6 kg)   SpO2 97%   BMI 32.05 kg/m      Review of Systems  Constitutional: Negative for appetite change, chills, fatigue and fever.  HENT: Negative for congestion, dental problem, ear pain, hearing loss, sore throat, tinnitus, trouble swallowing and voice  change.   Eyes: Negative for pain, discharge and visual disturbance.  Respiratory: Negative for cough, chest tightness, wheezing and stridor.   Cardiovascular: Negative for chest pain, palpitations and leg swelling.  Gastrointestinal: Positive for nausea. Negative for abdominal distention, abdominal pain, blood in stool, constipation, diarrhea and vomiting.  Genitourinary: Negative for difficulty urinating, discharge, flank pain, genital sores, hematuria and urgency.  Musculoskeletal: Negative for arthralgias, back pain, gait problem, joint swelling, myalgias and neck stiffness.  Skin: Negative for rash.  Neurological: Negative for dizziness, syncope, speech difficulty, weakness, numbness and headaches.  Hematological: Negative for adenopathy. Does not bruise/bleed easily.    Psychiatric/Behavioral: Negative for behavioral problems and dysphoric mood. The patient is not nervous/anxious.        Objective:   Physical Exam  Constitutional: He is oriented to person, place, and time. He appears well-developed.  Weight 210 Blood pressure 122/74  HENT:  Head: Normocephalic.  Right Ear: External ear normal.  Left Ear: External ear normal.  Eyes: Conjunctivae and EOM are normal.  Neck: Normal range of motion.  Cardiovascular: Normal rate and normal heart sounds.  Pulmonary/Chest: Breath sounds normal.  Abdominal: Bowel sounds are normal. He exhibits no distension. There is no tenderness. There is no rebound.  Musculoskeletal: Normal range of motion. He exhibits no edema or tenderness.  Neurological: He is alert and oriented to person, place, and time.  Psychiatric: He has a normal mood and affect. His behavior is normal.          Assessment & Plan:   Mild reflux.  Will place on bland diet with small frequent feedings.  Will also place on short-term PPI therapy  Coronary artery disease stable  Raymond Warnke Homero FellersFRANK

## 2017-09-16 ENCOUNTER — Telehealth: Payer: Self-pay | Admitting: Cardiology

## 2017-09-16 NOTE — Telephone Encounter (Signed)
°*  STAT* If patient is at the pharmacy, call can be transferred to refill team.   1. Which medications need to be refilled? (please list name of each medication and dose if known) Nitroglycerin 0.4 MG  2. Which pharmacy/location (including street and city if local pharmacy) is medication to be sent to? [facility] [phone] - [place],  - [address] [facility] [address]  3. Do they need a 30 day or 90 day supply? 30

## 2017-09-17 MED ORDER — NITROGLYCERIN 0.4 MG SL SUBL: 0.4000 mg | SUBLINGUAL_TABLET | SUBLINGUAL | 12 refills | Status: DC | PRN

## 2017-10-06 ENCOUNTER — Other Ambulatory Visit: Payer: Self-pay | Admitting: Internal Medicine

## 2017-10-11 ENCOUNTER — Other Ambulatory Visit: Payer: Self-pay | Admitting: Internal Medicine

## 2017-10-14 NOTE — Telephone Encounter (Signed)
OK for RF 

## 2017-11-12 ENCOUNTER — Ambulatory Visit: Payer: Self-pay | Admitting: Internal Medicine

## 2017-11-12 DIAGNOSIS — Z0289 Encounter for other administrative examinations: Secondary | ICD-10-CM

## 2017-11-29 ENCOUNTER — Ambulatory Visit (INDEPENDENT_AMBULATORY_CARE_PROVIDER_SITE_OTHER): Payer: Self-pay | Admitting: Family Medicine

## 2017-11-29 ENCOUNTER — Encounter: Payer: Self-pay | Admitting: Family Medicine

## 2017-11-29 VITALS — BP 126/84 | HR 84 | Temp 98.4°F | Resp 12 | Ht 68.0 in | Wt 212.4 lb

## 2017-11-29 DIAGNOSIS — K219 Gastro-esophageal reflux disease without esophagitis: Secondary | ICD-10-CM

## 2017-11-29 DIAGNOSIS — J310 Chronic rhinitis: Secondary | ICD-10-CM

## 2017-11-29 DIAGNOSIS — R1010 Upper abdominal pain, unspecified: Secondary | ICD-10-CM

## 2017-11-29 MED ORDER — FLUTICASONE PROPIONATE 50 MCG/ACT NA SUSP: 1.0000 | Freq: Every day | NASAL | 3 refills | Status: DC

## 2017-11-29 NOTE — Patient Instructions (Signed)
  Raymond Norris I have seen you today for an acute visit.  A few things to remember from today's visit:   Rhinitis, unspecified type - Plan: fluticasone (FLONASE) 50 MCG/ACT nasal spray  Pain of upper abdomen  Gastroesophageal reflux disease without esophagitis   Medications prescribed today are intended for short period of time and will not be refill upon request, a follow up appointment might be necessary to discuss continuation of of treatment if appropriate.    Abdominal Bloating When you have abdominal bloating, your abdomen may feel full, tight, or painful. It may also look bigger than normal or swollen (distended). Common causes of abdominal bloating include:  Swallowing air.  Constipation.  Problems digesting food.  Eating too much.  Irritable bowel syndrome. This is a condition that affects the large intestine.  Lactose intolerance. This is an inability to digest lactose, a natural sugar in dairy products.  Celiac disease. This is a condition that affects the ability to digest gluten, a protein found in some grains.  Gastroparesis. This is a condition that slows down the movement of food in the stomach and small intestine. It is more common in people with diabetes mellitus.  Gastroesophageal reflux disease (GERD). This is a digestive condition that makes stomach acid flow back into the esophagus.  Urinary retention. This means that the body is holding onto urine, and the bladder cannot be emptied all the way.  Follow these instructions at home: Eating and drinking  Avoid eating too much.  Try not to swallow air while talking or eating.  Avoid eating while lying down.  Avoid these foods and drinks: ? Foods that cause gas, such as broccoli, cabbage, cauliflower, and baked beans. ? Carbonated drinks. ? Hard candy. ? Chewing gum. Medicines  Take over-the-counter and prescription medicines only as told by your health care provider.  Take probiotic  medicines. These medicines contain live bacteria or yeasts that can help digestion.  Take coated peppermint oil capsules. Activity  Try to exercise regularly. Exercise may help to relieve bloating that is caused by gas and relieve constipation. General instructions  Keep all follow-up visits as told by your health care provider. This is important. Contact a health care provider if:  You have nausea and vomiting.  You have diarrhea.  You have abdominal pain.  You have unusual weight loss or weight gain.  You have severe pain, and medicines do not help. Get help right away if:  You have severe chest pain.  You have trouble breathing.  You have shortness of breath.  You have trouble urinating.  You have darker urine than normal.  You have blood in your stools or have dark, tarry stools. Summary  Abdominal bloating means that the abdomen is swollen.  Common causes of abdominal bloating are swallowing air, constipation, and problems digesting food.  Avoid eating too much and avoid swallowing air.  Avoid foods that cause gas, carbonated drinks, hard candy, and chewing gum. This information is not intended to replace advice given to you by your health care provider. Make sure you discuss any questions you have with your health care provider. Document Released: 09/14/2016 Document Revised: 09/14/2016 Document Reviewed: 09/14/2016 Elsevier Interactive Patient Education  [employer]2018 [employer]  In general please monitor for signs of worsening symptoms and seek immediate medical attention if any concerning.    I hope you get better soon!

## 2017-11-29 NOTE — Progress Notes (Signed)
ACUTE VISIT   HPI:  Chief Complaint  Patient presents with  . Abdominal Pain    right sided abdominal pain that radiates to mid chest only when eating. sx started 2 weeks ago  . Nasal Congestion    only when drinking something cold    [name] is a 67 y.o. male, who is here today complaining of intermittent upper abdominal pain that started about 2 weeks ago. Pain usually starts in RUQ and radiates to epigastrium and lower mid chest. It is exacerbated by food intake, any type of food. Pain resolves after burping or passing gas. He describes pain as "pinching light pain" that lasts about 5 minutes.  He denies associated palpitations, diaphoresis,SOB, or dizziness.  No associated nausea, vomiting, changes in bowel habits, or blood in the stool. Last bowel movement today, he has one daily.  For a while he has had frequent burping and flatulence. He has not identified particular exacerbating factors, discomfort is relieved after passing gas. He denies high alcohol intake. He has not try OTC treatments.  He reports last colonoscopy about 6 years ago and 10-year follow-up was recommended.  Lab Results  Component Value Date   ALT 20 05/10/2017   AST 22 05/10/2017   ALKPHOS 42 05/10/2017   BILITOT 1.0 05/10/2017   Hx of GERD. Stopped taking PPI because it was not helping with above symptoms. He has heartburn "once in a while."  -He is also complaining of some rhinorrhea, nasal congestion, and postnasal drainage.  These symptoms seem to be exacerbated by drinking cold beverages. He has used Flonase nasal spray in the past and it has helped but he ran out of medication. He denies cough, wheezing, or dyspnea.  Review of Systems  Constitutional: Negative for appetite change, fatigue, fever and unexpected weight change.  HENT: Positive for congestion, postnasal drip and rhinorrhea. Negative for facial swelling, mouth sores, nosebleeds, sore throat and trouble  swallowing.   Eyes: Negative for redness and visual disturbance.  Respiratory: Negative for cough, shortness of breath and wheezing.   Cardiovascular: Negative for palpitations and leg swelling.  Gastrointestinal: Positive for abdominal pain. Negative for blood in stool, constipation, nausea and vomiting.  Endocrine: Negative for cold intolerance and heat intolerance.  Genitourinary: Negative for decreased urine volume, dysuria and hematuria.  Musculoskeletal: Negative for back pain and myalgias.  Skin: Negative for pallor and rash.  Allergic/Immunologic: Positive for environmental allergies.  Neurological: Negative for weakness and headaches.      Current Outpatient Medications on File Prior to Visit  Medication Sig Dispense Refill  . amLODipine (NORVASC) 5 MG tablet TAKE 1 TABLET BY MOUTH DAILY(KEEP OFFICE VISIT) 30 tablet 11  . benzonatate (TESSALON) 100 MG capsule Take 1 capsule (100 mg total) by mouth every 8 (eight) hours. 21 capsule 0  . nitroGLYCERIN (NITROSTAT) 0.4 MG SL tablet Place 1 tablet (0.4 mg total) under the tongue every 5 (five) minutes x 3 doses as needed for chest pain. 25 tablet 12  . rosuvastatin (CRESTOR) 40 MG tablet TAKE 1 TABLET BY MOUTH DAILY 90 tablet 2  . aspirin 81 MG tablet Take 81 mg by mouth daily.    . pantoprazole (PROTONIX) 40 MG tablet Take 1 tablet (40 mg total) by mouth daily. (Patient not taking: Reported on 11/29/2017) 30 tablet 3   No current facility-administered medications on file prior to visit.      Past Medical History:  Diagnosis Date  . Coronary artery disease   .  GERD 04/26/2008  . Myocardial infarction (HCC)    No Known Allergies  Social History   Socioeconomic History  . Marital status: Married    Spouse name: Not on file  . Number of children: Not on file  . Years of education: Not on file  . Highest education level: Not on file  Occupational History  . Not on file  Social Needs  . Financial resource strain: Not on  file  . Food insecurity:    Worry: Not on file    Inability: Not on file  . Transportation needs:    Medical: Not on file    Non-medical: Not on file  Tobacco Use  . Smoking status: Never Smoker  . Smokeless tobacco: Never Used  Substance and Sexual Activity  . Alcohol use: No  . Drug use: No  . Sexual activity: Not on file  Lifestyle  . Physical activity:    Days per week: Not on file    Minutes per session: Not on file  . Stress: Not on file  Relationships  . Social connections:    Talks on phone: Not on file    Gets together: Not on file    Attends religious service: Not on file    Active member of club or organization: Not on file    Attends meetings of clubs or organizations: Not on file    Relationship status: Not on file  Other Topics Concern  . Not on file  Social History Narrative  . Not on file    Vitals:   11/29/17 1516  BP: 126/84  Pulse: 84  Resp: 12  Temp: 98.4 F (36.9 C)  SpO2: 97%   Body mass index is 32.29 kg/m.  Physical Exam  Nursing note and vitals reviewed. Constitutional: He is oriented to person, place, and time. He appears well-developed. No distress.  HENT:  Head: Normocephalic and atraumatic.  Mouth/Throat: Oropharynx is clear and moist and mucous membranes are normal.  Hypertrophic turbinates. Postnasal drainage.  Eyes: Conjunctivae are normal.  Cardiovascular: Normal rate and regular rhythm.  No murmur heard. Respiratory: Effort normal and breath sounds normal. No respiratory distress.  GI: Soft. He exhibits no mass. There is no hepatomegaly. There is no tenderness.  Musculoskeletal: He exhibits no edema or tenderness.  Lymphadenopathy:    He has no cervical adenopathy.  Neurological: He is alert and oriented to person, place, and time. He has normal strength. Gait normal.  Skin: Skin is warm. No rash noted. No erythema.  Psychiatric: He has a normal mood and affect.  Fairly groomed, good eye contact.    ASSESSMENT AND  PLAN:   [name] was seen today for abdominal pain and nasal congestion.  Diagnoses and all orders for this visit:  Pain of upper abdomen  No pain or abnormalities on abdominal examination today. We discussed possible etiologies: Dyspepsia, discomfort due to abdominal bloating,and also to consider gall bladder disease. Recommend light/bland diet, smaller portions, and avoid eating after 8 PM. He was instructed about warning signs. At this time I will obtain imaging or lab work are necessary. Follow-up with PCP in 3 weeks, before if needed.  Rhinitis, unspecified type  OTC Zyrtec 10 mg daily and nasal saline irrigations as needed recommended. Flonase intranasal spray to resume.  Some side effects discussed. Follow-up with PCP as needed.  -     fluticasone (FLONASE) 50 MCG/ACT nasal spray; Place 1 spray into both nostrils daily.  Gastroesophageal reflux disease without esophagitis  She  is reporting occasional heartburn, Protonix did not help. Recommend continuing nonpharmacologic treatment, GERD precautions.     Return in about 3 weeks (around 12/20/2017) for abd pain with PCP.     -[name] was advised to seek immediate medical attention if sudden worsening symptoms.     Tulip Meharg G. [place], MD  [facility]. [place] office.

## 2017-12-19 ENCOUNTER — Ambulatory Visit: Payer: Self-pay | Admitting: Internal Medicine

## 2017-12-19 DIAGNOSIS — Z2089 Contact with and (suspected) exposure to other communicable diseases: Secondary | ICD-10-CM

## 2018-01-29 ENCOUNTER — Encounter: Payer: Self-pay | Admitting: Internal Medicine

## 2018-01-29 DIAGNOSIS — Z0289 Encounter for other administrative examinations: Secondary | ICD-10-CM

## 2018-04-03 ENCOUNTER — Other Ambulatory Visit: Payer: Self-pay | Admitting: Cardiology

## 2018-04-03 DIAGNOSIS — E785 Hyperlipidemia, unspecified: Secondary | ICD-10-CM

## 2018-04-03 NOTE — Telephone Encounter (Signed)
Rx sent to pharmacy   

## 2018-04-13 ENCOUNTER — Other Ambulatory Visit: Payer: Self-pay | Admitting: Cardiology

## 2018-04-14 NOTE — Telephone Encounter (Signed)
Rx sent to pharmacy   

## 2018-05-24 ENCOUNTER — Other Ambulatory Visit: Payer: Self-pay | Admitting: Cardiology

## 2018-05-24 DIAGNOSIS — E785 Hyperlipidemia, unspecified: Secondary | ICD-10-CM

## 2018-06-09 ENCOUNTER — Other Ambulatory Visit: Payer: Self-pay | Admitting: Cardiology

## 2018-06-09 DIAGNOSIS — E785 Hyperlipidemia, unspecified: Secondary | ICD-10-CM

## 2018-06-23 ENCOUNTER — Encounter: Payer: Self-pay | Admitting: Gastroenterology

## 2018-06-27 ENCOUNTER — Telehealth: Payer: Self-pay | Admitting: Cardiology

## 2018-06-27 DIAGNOSIS — E7849 Other hyperlipidemia: Secondary | ICD-10-CM

## 2018-06-27 NOTE — Telephone Encounter (Signed)
Patient of Dr. Jens Som walked in w/complaints about cost of crestor. He states he is paying $23 for 7 pills. He states he cannot afford more than 7 pills at one time. I had already called pharmacy prior and was notified he has been getting partial refills since August, despite having requested a 90 day supply. Patient is requesting a med change to a statin that is more economical for him.   He is also due for 1 year visit with MD and was escorted to scheduling to arrange.   Message sent to MD/RN

## 2018-06-28 NOTE — Telephone Encounter (Signed)
DC crestor; lipitor 80 mg daily; lipids and liver 4 weeks Suhaib Guzzo  

## 2018-06-30 NOTE — Telephone Encounter (Signed)
LMTCB to review MD recommendations

## 2018-07-01 MED ORDER — ATORVASTATIN CALCIUM 80 MG PO TABS: 80.0000 mg | ORAL_TABLET | Freq: Every day | ORAL | 3 refills | Status: DC

## 2018-07-01 NOTE — Telephone Encounter (Signed)
Pt aware and will return 12/2 for repeat labs ./cy

## 2018-07-01 NOTE — Telephone Encounter (Signed)
Follow up  ° ° °Patient is returning call.  °

## 2018-07-31 ENCOUNTER — Ambulatory Visit: Payer: Self-pay

## 2018-07-31 ENCOUNTER — Ambulatory Visit: Payer: Self-pay | Admitting: Family Medicine

## 2018-07-31 NOTE — Telephone Encounter (Signed)
Spoke with pt state that he felt better, advised and insisted for pt to go to the emergency room per Dr Salomon FickBanks for evaluation since he has history of cardiac problems, pt voiced understanding and stated that he was going to the ED

## 2018-07-31 NOTE — Telephone Encounter (Signed)
  rec'd call from pt.  Reported having intermittent pain "under left breast."  Stated the pain has been occurring for approx. 2 weeks, at infrequent intervals.  Denied any chest pain at this time.  Reported last episode was on Sunday or Monday of this week; episodes last "5-10 seconds."  Stated the pains are mild; "1/10".  Reported he has shortness of breath with activity, but denied SOB assoc. With episodes of chest pain.  Denied radiation of the pain, sweating, nausea, or vomiting.  Stated he does have hx of cardiac stents about 5 yrs. Ago.  Wife on phone also, and reported hx of "mild heart attack."  Questioned if he contacted his Cardiologist about his symptoms.  Stated he called Dr. Ludwig Clarksrenshaw's office and was informed he is out of the office and to call his PCP or go to UC.  Appt. Scheduled at PCP office today at 2:00 PM.  Care advice given per protocol.  Advised to go to ER if chest pain increases in intensity and duration, increased SOB, nausea/ vomiting, and sweating.  Pt. verb. understanding.        Reason for Disposition . [1] Chest pain lasting <= 5 minutes AND [2] NO chest pain or cardiac symptoms now(Exceptions: pains lasting a few seconds)  Answer Assessment - Initial Assessment Questions 1. LOCATION: "Where does it hurt?"       Under left breast 2. RADIATION: "Does the pain go anywhere else?" (e.g., into neck, jaw, arms, back)     Denied radiation  3. ONSET: "When did the chest pain begin?" (Minutes, hours or days)      About 2 weeks, it occurs every once in awhile 4. PATTERN "Does the pain come and go, or has it been constant since it started?"  "Does it get worse with exertion?"      "every very once in awhile"; it occurs randomly; sometimes a week between occurrences   5. DURATION: "How long does it last" (e.g., seconds, minutes, hours)     5-10 seconds  6. SEVERITY: "How bad is the pain?"  (e.g., Scale 1-10; mild, moderate, or severe)    - MILD (1-3): doesn't interfere with normal  activities     - MODERATE (4-7): interferes with normal activities or awakens from sleep    - SEVERE (8-10): excruciating pain, unable to do any normal activities       Mild; 1/10 7. CARDIAC RISK FACTORS: "Do you have any history of heart problems or risk factors for heart disease?" (e.g., prior heart attack, angina; high blood pressure, diabetes, being overweight, high cholesterol, smoking, or strong family history of heart disease)     Hx of "mild heart attack"; Hx Cardiac stents about 5 yrs ago, High Cholesterol; denied family history    8. PULMONARY RISK FACTORS: "Do you have any history of lung disease?"  (e.g., blood clots in lung, asthma, emphysema, birth control pills)     Denied 9. CAUSE: "What do you think is causing the chest pain?"    unknown 10. OTHER SYMPTOMS: "Do you have any other symptoms?" (e.g., dizziness, nausea, vomiting, sweating, fever, difficulty breathing, cough)      Denied dizziness, sweating, nausea or vomiting; stated short of breath with activity  Protocols used: CHEST PAIN-A-AH

## 2018-07-31 NOTE — Telephone Encounter (Signed)
Noted  

## 2018-08-01 ENCOUNTER — Emergency Department (HOSPITAL_COMMUNITY): Payer: Self-pay

## 2018-08-01 ENCOUNTER — Encounter (HOSPITAL_COMMUNITY): Payer: Self-pay | Admitting: *Deleted

## 2018-08-01 ENCOUNTER — Other Ambulatory Visit: Payer: Self-pay

## 2018-08-01 ENCOUNTER — Emergency Department (HOSPITAL_COMMUNITY)
Admission: EM | Admit: 2018-08-01 | Discharge: 2018-08-01 | Disposition: A | Discharge status: Home / Self Care | Payer: Self-pay | Attending: Emergency Medicine | Admitting: Emergency Medicine

## 2018-08-01 DIAGNOSIS — Z7982 Long term (current) use of aspirin: Secondary | ICD-10-CM | POA: Insufficient documentation

## 2018-08-01 DIAGNOSIS — R0789 Other chest pain: Secondary | ICD-10-CM | POA: Insufficient documentation

## 2018-08-01 DIAGNOSIS — I10 Essential (primary) hypertension: Secondary | ICD-10-CM | POA: Insufficient documentation

## 2018-08-01 DIAGNOSIS — E785 Hyperlipidemia, unspecified: Secondary | ICD-10-CM | POA: Insufficient documentation

## 2018-08-01 DIAGNOSIS — I2510 Atherosclerotic heart disease of native coronary artery without angina pectoris: Secondary | ICD-10-CM | POA: Insufficient documentation

## 2018-08-01 DIAGNOSIS — Z79899 Other long term (current) drug therapy: Secondary | ICD-10-CM | POA: Insufficient documentation

## 2018-08-01 DIAGNOSIS — I252 Old myocardial infarction: Secondary | ICD-10-CM | POA: Insufficient documentation

## 2018-08-01 LAB — BASIC METABOLIC PANEL
Anion gap: 8 (ref 5–15)
BUN: 14 mg/dL (ref 8–23)
CO2: 24 mmol/L (ref 22–32)
Calcium: 9.2 mg/dL (ref 8.9–10.3)
Chloride: 111 mmol/L (ref 98–111)
Creatinine, Ser: 1.38 mg/dL — ABNORMAL HIGH (ref 0.61–1.24)
GFR calc Af Amer: >60 mL/min (ref >60)
GFR calc non Af Amer: 52 mL/min — ABNORMAL LOW (ref >60)
GLUCOSE: 112 mg/dL — AB (ref 70–99)
Potassium: 4.1 mmol/L (ref 3.5–5.1)
Sodium: 143 mmol/L (ref 135–145)

## 2018-08-01 LAB — CBC
HEMATOCRIT: 49.1 % (ref 39.0–52.0)
Hemoglobin: 15.3 g/dL (ref 13.0–17.0)
MCH: 29.9 pg (ref 26.0–34.0)
MCHC: 31.2 g/dL (ref 30.0–36.0)
MCV: 95.9 fL (ref 80.0–100.0)
Platelets: 362 10*3/uL (ref 150–400)
RBC: 5.12 MIL/uL (ref 4.22–5.81)
RDW: 14.8 % (ref 11.5–15.5)
WBC: 8.3 10*3/uL (ref 4.0–10.5)
nRBC: 0 % (ref 0.0–0.2)

## 2018-08-01 LAB — I-STAT TROPONIN, ED: Troponin i, poc: 0 ng/mL (ref 0.00–0.08)

## 2018-08-01 NOTE — Discharge Instructions (Addendum)
Call your primary care physician on Monday, 08/04/2018 to let him know that you are here.  He may want to see you in in his office.  Lab work today shows that kidney function is not perfect however unchanged from 3 years ago.

## 2018-08-01 NOTE — ED Notes (Signed)
ED Provider at bedside. 

## 2018-08-01 NOTE — ED Provider Notes (Signed)
[facility] EMERGENCY DEPARTMENT Provider Note   CSN: [id number] Arrival date & time: 08/01/18  4098     History   Chief Complaint Chief Complaint  Patient presents with  . Chest Pain    HPI Raymond Norris is a 67 y.o. male.  Planes of chest pain onset a week ago.  Pain is at left side immediately inferior to left breast.  His last episode was  5 days ago.  Each episode lasted 5 seconds or so.  He has had a lot of belching and passing gas per rectum.  Is not exertional does not feel like MI he has had in the past which was continuous chest pain radiating to his neck.  He is presently asymptomatic without treatment.  He has been asymptomatic for the past 5 days.  He called his PCPs office this morning who advised him to come to the emergency department.  HPI  Past Medical History:  Diagnosis Date  . Coronary artery disease   . GERD 04/26/2008  . Myocardial infarction [facility])     Patient Active Problem List   Diagnosis Date Noted  . Bruit 08/04/2015  . Dyslipidemia 03/02/2015  . NSTEMI (non-ST elevated myocardial infarction) (HCC) 01/31/2015  . Unstable angina (HCC) 01/31/2015  . Benign essential HTN 01/31/2015  . CAD (coronary artery disease) 01/31/2015  . GERD 04/26/2008    Past Surgical History:  Procedure Laterality Date  . CARDIAC CATHETERIZATION N/A 01/31/2015   Procedure: Left Heart Cath and Coronary Angiography;  Surgeon: Tonny Bollman, MD;  Location: [facility] INVASIVE CV LAB;  Service: Cardiovascular;  Laterality: N/A;  . PERCUTANEOUS CORONARY STENT INTERVENTION (PCI-S)  01/2015   LEFT CIRCUMFLEX  1ST & 2ND   OM BRANCHES        Home Medications    Prior to Admission medications   Medication Sig Start Date End Date Taking? Authorizing Provider  amLODipine (NORVASC) 5 MG tablet Take 1 tablet (5 mg total) by mouth daily. Please call for further refills. 04/14/18   Lewayne Bunting, MD  aspirin 81 MG tablet Take 81 mg by mouth daily.    [provider]  atorvastatin (LIPITOR) 80 MG tablet Take 1 tablet (80 mg total) by mouth daily. 07/01/18 09/29/18  Lewayne Bunting, MD  benzonatate (TESSALON) 100 MG capsule Take 1 capsule (100 mg total) by mouth every 8 (eight) hours. 08/03/17   Levora Dredge, MD  fluticasone (FLONASE) 50 MCG/ACT nasal spray Place 1 spray into both nostrils daily. 11/29/17   [place], Betty G, MD  nitroGLYCERIN (NITROSTAT) 0.4 MG SL tablet Place 1 tablet (0.4 mg total) under the tongue every 5 (five) minutes x 3 doses as needed for chest pain. 09/17/17   Lewayne Bunting, MD  pantoprazole (PROTONIX) 40 MG tablet Take 1 tablet (40 mg total) by mouth daily. Patient not taking: Reported on 11/29/2017 08/14/17   Gordy Savers, MD    Family History Family History  Family history unknown: Yes   Family history negative for coronary disease Social History Social History   Tobacco Use  . Smoking status: Never Smoker  . Smokeless tobacco: Never Used  Substance Use Topics  . Alcohol use: No  . Drug use: No     Allergies   Patient has no known allergies.   Review of Systems Review of Systems  Constitutional: Negative.   HENT: Negative.   Respiratory: Negative.   Cardiovascular: Positive for chest pain.  Gastrointestinal: Negative.  Belching,  Musculoskeletal: Negative.   Skin: Negative.   Neurological: Negative.   Psychiatric/Behavioral: Negative.   All other systems reviewed and are negative.    Physical Exam Updated Vital Signs BP (!) 141/92 (BP Location: Right Arm)   Pulse 70   Temp 98.1 F (36.7 C) (Oral)   Resp 12   Ht 5\' 8"  (1.727 m)   Wt 90.7 kg   SpO2 100%   BMI 30.41 kg/m   Physical Exam  Constitutional: He appears well-developed and well-nourished.  HENT:  Head: Normocephalic and atraumatic.  Eyes: Pupils are equal, round, and reactive to light. Conjunctivae are normal.  Neck: Neck supple. No tracheal deviation present. No thyromegaly present.  Cardiovascular:  Normal rate and regular rhythm.  No murmur heard. Pulmonary/Chest: Effort normal and breath sounds normal.  Abdominal: Soft. Bowel sounds are normal. He exhibits no distension. There is no tenderness.  Musculoskeletal: Normal range of motion. He exhibits no edema or tenderness.  Neurological: He is alert. Coordination normal.  Skin: Skin is warm and dry. No rash noted.  Psychiatric: He has a normal mood and affect.  Nursing note and vitals reviewed.    ED Treatments / Results  Labs (all labs ordered are listed, but only abnormal results are displayed) Labs Reviewed  BASIC METABOLIC PANEL - Abnormal; Notable for the following components:      Result Value   Glucose, Bld 112 (*)    Creatinine, Ser 1.38 (*)    GFR calc non Af Amer 52 (*)    All other components within normal limits  CBC  I-STAT TROPONIN, ED    EKG EKG Interpretation  Date/Time:  Friday August 01 2018 10:06:22 EST Ventricular Rate:  72 PR Interval:  144 QRS Duration: 90 QT Interval:  354 QTC Calculation: 387 R Axis:   23 Text Interpretation:  Normal sinus rhythm Nonspecific T wave abnormality Abnormal ECG No significant change since last tracing Confirmed by Doug SouJacubowitz, Jerrol Helmers [phone]([id number]) on 08/01/2018 11:19:50 AM   Radiology Dg Chest 2 View  Result Date: 08/01/2018 CLINICAL DATA:  Chest pain. EXAM: CHEST - 2 VIEW COMPARISON:  Radiographs of August 03, 2017. FINDINGS: The heart size and mediastinal contours are within normal limits. Both lungs are clear. No pneumothorax or pleural effusion is noted. The visualized skeletal structures are unremarkable. IMPRESSION: No active cardiopulmonary disease. Electronically Signed   By: Lupita RaiderJames  Green Jr, M.D.   On: 08/01/2018 10:34    Procedures Procedures (including critical care time)  Medications Ordered in ED Medications - No data to display   Initial Impression / Assessment and Plan / ED Course  I have reviewed the triage vital signs and the nursing  notes.  Pertinent labs & imaging results that were available during my care of the patient were reviewed by me and considered in my medical decision making (see chart for details).     Lab work remarkable for mild renal insufficiency otherwise normal. Chest x-ray viewed by me Results for orders placed or performed during the hospital encounter of 08/01/18  Basic metabolic panel  Result Value Ref Range   Sodium 143 135 - 145 mmol/L   Potassium 4.1 3.5 - 5.1 mmol/L   Chloride 111 98 - 111 mmol/L   CO2 24 22 - 32 mmol/L   Glucose, Bld 112 (H) 70 - 99 mg/dL   BUN 14 8 - 23 mg/dL   Creatinine, Ser 6.041.38 (H) 0.61 - 1.24 mg/dL   Calcium 9.2 8.9 - 54.010.3 mg/dL   GFR  calc non Af Amer 52 (L) >60 mL/min   GFR calc Af Amer >60 >60 mL/min   Anion gap 8 5 - 15  CBC  Result Value Ref Range   WBC 8.3 4.0 - 10.5 K/uL   RBC 5.12 4.22 - 5.81 MIL/uL   Hemoglobin 15.3 13.0 - 17.0 g/dL   HCT 96.0 45.4 - 09.8 %   MCV 95.9 80.0 - 100.0 fL   MCH 29.9 26.0 - 34.0 pg   MCHC 31.2 30.0 - 36.0 g/dL   RDW 11.9 14.7 - 82.9 %   Platelets 362 150 - 400 K/uL   nRBC 0.0 0.0 - 0.2 %  I-stat troponin, ED  Result Value Ref Range   Troponin i, poc 0.00 0.00 - 0.08 ng/mL   Comment 3           Dg Chest 2 View  Result Date: 08/01/2018 CLINICAL DATA:  Chest pain. EXAM: CHEST - 2 VIEW COMPARISON:  Radiographs of August 03, 2017. FINDINGS: The heart size and mediastinal contours are within normal limits. Both lungs are clear. No pneumothorax or pleural effusion is noted. The visualized skeletal structures are unremarkable. IMPRESSION: No active cardiopulmonary disease. Electronically Signed   By: Lupita Raider, M.D.   On: 08/01/2018 10:34  Strongly feel this is noncardiac chest pain.  Highly atypical symptoms.  Score equals 3. Follow-up with PMD Final Clinical Impressions(s) / ED Diagnoses  Diagnoses #1 atypical chest pain #2 chronic renal insufficiency Final diagnoses:  None    ED Discharge Orders    None        Doug Sou, MD 08/01/18 1650

## 2018-08-01 NOTE — ED Triage Notes (Signed)
PT reports Lt sided CP for 2 weeks and Pt reported increased burping . Pt feels CP approx . Once  Or twice a day. Pt reports no CP for past 2 days. Pt instructed to come to ED to be checked out. Pt had a MI 2 years ago and stents x2 placed.

## 2018-09-09 NOTE — Progress Notes (Deleted)
HPI: FU CAD. Patient had cardiac catheterization June 2016. There was a 100% second marginal and a 90% first marginal. He had drug-eluting stents to both lesions. There was no obstructive disease in the right coronary artery or LAD. LV function was normal. Abdominal ultrasound 12/16 showed no aneurysm. Seen with atypical CP 12/19; troponin negative, Hgb 15.3 and chest xray with no infiltrate. Since last seen,   Current Outpatient Medications  Medication Sig Dispense Refill  . amLODipine (NORVASC) 5 MG tablet Take 1 tablet (5 mg total) by mouth daily. Please call for further refills. 90 tablet 0  . aspirin 81 MG tablet Take 81 mg by mouth daily.    Marland Kitchen atorvastatin (LIPITOR) 80 MG tablet Take 1 tablet (80 mg total) by mouth daily. 90 tablet 3  . benzonatate (TESSALON) 100 MG capsule Take 1 capsule (100 mg total) by mouth every 8 (eight) hours. 21 capsule 0  . fluticasone (FLONASE) 50 MCG/ACT nasal spray Place 1 spray into both nostrils daily. 1 g 3  . nitroGLYCERIN (NITROSTAT) 0.4 MG SL tablet Place 1 tablet (0.4 mg total) under the tongue every 5 (five) minutes x 3 doses as needed for chest pain. 25 tablet 12  . pantoprazole (PROTONIX) 40 MG tablet Take 1 tablet (40 mg total) by mouth daily. (Patient not taking: Reported on 11/29/2017) 30 tablet 3   No current facility-administered medications for this visit.      Past Medical History:  Diagnosis Date  . Coronary artery disease   . GERD 04/26/2008  . Myocardial infarction [facility])     Past Surgical History:  Procedure Laterality Date  . CARDIAC CATHETERIZATION N/A 01/31/2015   Procedure: Left Heart Cath and Coronary Angiography;  Surgeon: Tonny Bollman, MD;  Location: [facility] INVASIVE CV LAB;  Service: Cardiovascular;  Laterality: N/A;  . PERCUTANEOUS CORONARY STENT INTERVENTION (PCI-S)  01/2015   LEFT CIRCUMFLEX  1ST & 2ND   OM BRANCHES    Social History   Socioeconomic History  . Marital status: Married    Spouse name: Not on file  .  Number of children: Not on file  . Years of education: Not on file  . Highest education level: Not on file  Occupational History  . Not on file  Social Needs  . Financial resource strain: Not on file  . Food insecurity:    Worry: Not on file    Inability: Not on file  . Transportation needs:    Medical: Not on file    Non-medical: Not on file  Tobacco Use  . Smoking status: Never Smoker  . Smokeless tobacco: Never Used  Substance and Sexual Activity  . Alcohol use: No  . Drug use: No  . Sexual activity: Not on file  Lifestyle  . Physical activity:    Days per week: Not on file    Minutes per session: Not on file  . Stress: Not on file  Relationships  . Social connections:    Talks on phone: Not on file    Gets together: Not on file    Attends religious service: Not on file    Active member of club or organization: Not on file    Attends meetings of clubs or organizations: Not on file    Relationship status: Not on file  . Intimate partner violence:    Fear of current or ex partner: Not on file    Emotionally abused: Not on file    Physically abused: Not on file  Forced sexual activity: Not on file  Other Topics Concern  . Not on file  Social History Narrative  . Not on file    Family History  Family history unknown: Yes    ROS: no fevers or chills, productive cough, hemoptysis, dysphasia, odynophagia, melena, hematochezia, dysuria, hematuria, rash, seizure activity, orthopnea, PND, pedal edema, claudication. Remaining systems are negative.  Physical Exam: Well-developed well-nourished in no acute distress.  Skin is warm and dry.  HEENT is normal.  Neck is supple.  Chest is clear to auscultation with normal expansion.  Cardiovascular exam is regular rate and rhythm.  Abdominal exam nontender or distended. No masses palpated. Extremities show no edema. neuro grossly intact  ECG- personally reviewed  A/P  1 coronary artery disease-plan to continue  medical therapy with aspirin and statin.  2 chest pain-  3 hypertension-patient's blood pressure is controlled.  Continue present medications and follow.  4 hyperlipidemia-continue statin.  Check lipids and liver.  Olga Millers, MD

## 2018-09-12 ENCOUNTER — Ambulatory Visit: Payer: Self-pay | Admitting: Cardiology

## 2018-09-14 ENCOUNTER — Other Ambulatory Visit: Payer: Self-pay | Admitting: Cardiology

## 2018-09-15 ENCOUNTER — Encounter: Payer: Self-pay | Admitting: Cardiology

## 2018-10-17 ENCOUNTER — Other Ambulatory Visit: Payer: Self-pay | Admitting: Cardiology

## 2018-10-17 NOTE — Telephone Encounter (Signed)
Rx(s) sent to pharmacy electronically.  

## 2018-10-23 ENCOUNTER — Ambulatory Visit: Payer: 59 | Admitting: Internal Medicine

## 2018-10-23 ENCOUNTER — Encounter: Payer: Self-pay | Admitting: Internal Medicine

## 2018-10-23 VITALS — BP 118/78 | HR 82 | Temp 98.8°F | Ht 68.0 in | Wt 214.0 lb

## 2018-10-23 DIAGNOSIS — I2510 Atherosclerotic heart disease of native coronary artery without angina pectoris: Secondary | ICD-10-CM

## 2018-10-23 DIAGNOSIS — I10 Essential (primary) hypertension: Secondary | ICD-10-CM

## 2018-10-23 DIAGNOSIS — K219 Gastro-esophageal reflux disease without esophagitis: Secondary | ICD-10-CM

## 2018-10-23 DIAGNOSIS — Z1211 Encounter for screening for malignant neoplasm of colon: Secondary | ICD-10-CM

## 2018-10-23 DIAGNOSIS — E785 Hyperlipidemia, unspecified: Secondary | ICD-10-CM

## 2018-10-23 MED ORDER — PANTOPRAZOLE SODIUM 40 MG PO TBEC: 40.0000 mg | DELAYED_RELEASE_TABLET | Freq: Every day | ORAL | 2 refills | Status: DC

## 2018-10-23 NOTE — Patient Instructions (Signed)
-  Nice meeting you today!  -Start protonix 40 mg daily for your acid reflux issues.  -Will send you back to see your cardiologist.  -Schedule follow up for your physical with me at your convenience , please come in fasting for that appointment.

## 2018-10-23 NOTE — Progress Notes (Signed)
Established Patient Office Visit     CC/Reason for Visit: Establish care, f/u chronic conditions, discuss reflux issues  HPI: Raymond Norris is a 68 y.o. male who is coming in today for the above mentioned reasons. Past Medical History is significant for: HTN, HLD, GERD and CAD. He last saw cards in 2018. He had DES x 2 in June 2016. Was seen in the ED in Dec 2019 for atypical CP thought to be GERD and discharged home. He states he continues to have issues with what he thinks is reflux. Pain is intermittent, in the center of his chest but also points to epigastric area, has a sour taste in his mouth, has frequent belching. No SOB. He wants to go back on reflux meds. He has also been getting more fatigued and has less exercise tolerance.   Past Medical/Surgical History: Past Medical History:  Diagnosis Date  . Coronary artery disease   . GERD 04/26/2008  . Myocardial infarction [facility])     Past Surgical History:  Procedure Laterality Date  . CARDIAC CATHETERIZATION N/A 01/31/2015   Procedure: Left Heart Cath and Coronary Angiography;  Surgeon: Tonny Bollman, MD;  Location: [facility] INVASIVE CV LAB;  Service: Cardiovascular;  Laterality: N/A;  . PERCUTANEOUS CORONARY STENT INTERVENTION (PCI-S)  01/2015   LEFT CIRCUMFLEX  1ST & 2ND   OM BRANCHES    Social History:  reports that he has never smoked. He has never used smokeless tobacco. He reports that he does not drink alcohol or use drugs.  Allergies: No Known Allergies  Family History:  Family History  Family history unknown: Yes     Current Outpatient Medications:  .  amLODipine (NORVASC) 5 MG tablet, Take 1 tablet (5 mg total) by mouth daily. NEEDS APPOINTMENT FOR FUTURE REFILLS, Disp: 15 tablet, Rfl: 0 .  aspirin 81 MG tablet, Take 81 mg by mouth daily., Disp: , Rfl:  .  atorvastatin (LIPITOR) 80 MG tablet, Take 1 tablet (80 mg total) by mouth daily., Disp: 90 tablet, Rfl: 3 .  fluticasone (FLONASE) 50 MCG/ACT nasal spray,  Place 1 spray into both nostrils daily., Disp: 1 g, Rfl: 3 .  Multiple Vitamin (MULTIVITAMIN) tablet, Take 1 tablet by mouth daily., Disp: , Rfl:  .  nitroGLYCERIN (NITROSTAT) 0.4 MG SL tablet, Place 1 tablet (0.4 mg total) under the tongue every 5 (five) minutes x 3 doses as needed for chest pain., Disp: 25 tablet, Rfl: 12 .  pantoprazole (PROTONIX) 40 MG tablet, Take 1 tablet (40 mg total) by mouth daily., Disp: 30 tablet, Rfl: 2  Review of Systems:  Constitutional: Denies fever, chills, diaphoresis, appetite change and fatigue.  HEENT: Denies photophobia, eye pain, redness, hearing loss, ear pain, congestion, sore throat, rhinorrhea, sneezing, mouth sores, trouble swallowing, neck pain, neck stiffness and tinnitus.   Respiratory: Denies SOB, DOE, cough, chest tightness,  and wheezing.   Cardiovascular: Denies  palpitations and leg swelling.  Gastrointestinal: Denies nausea, vomiting, abdominal pain, diarrhea, constipation, blood in stool and abdominal distention.  Genitourinary: Denies dysuria, urgency, frequency, hematuria, flank pain and difficulty urinating.  Endocrine: Denies: hot or cold intolerance, sweats, changes in hair or nails, polyuria, polydipsia. Musculoskeletal: Denies myalgias, back pain, joint swelling, arthralgias and gait problem.  Skin: Denies pallor, rash and wound.  Neurological: Denies dizziness, seizures, syncope, weakness, light-headedness, numbness and headaches.  Hematological: Denies adenopathy. Easy bruising, personal or family bleeding history  Psychiatric/Behavioral: Denies suicidal ideation, mood changes, confusion, nervousness, sleep disturbance and agitation  Physical Exam: Vitals:   10/23/18 1510  BP: 118/78  Pulse: 82  Temp: 98.8 F (37.1 C)  TempSrc: Oral  SpO2: 96%  Weight: 214 lb (97.1 kg)  Height: 5\' 8"  (1.727 m)    Body mass index is 32.54 kg/m.   Constitutional: NAD, calm, comfortable Eyes: PERRL, lids and conjunctivae  normal ENMT: Mucous membranes are moist.  Respiratory: clear to auscultation bilaterally, no wheezing, no crackles. Normal respiratory effort. No accessory muscle use.  Cardiovascular: Regular rate and rhythm, no murmurs / rubs / gallops. No extremity edema. 2+ pedal pulses. No carotid bruits.  Abdomen: no tenderness, no masses palpated. No hepatosplenomegaly. Bowel sounds positive.  Musculoskeletal: no clubbing / cyanosis. No joint deformity upper and lower extremities. Good ROM, no contractures. Normal muscle tone.  Psychiatric: Normal judgment and insight. Alert and oriented x 3. Normal mood.    Impression and Plan:  Gastroesophageal reflux disease without esophagitis  Coronary artery disease involving native coronary artery of native heart without angina pectoris  -I am concerned that his symptoms may be an anginal equivalent. Will send back to cardiology for evaluation. Hasn't been seen in 18 mo. Last studied in 2016 at which time had 2 DES to 1st and 2d marginal. -Because he does have some symptoms that are typical of reflux, will start PPI for a 12 week trial. -If cards work up is negative, can consider GI evaluation.  Screening for colon cancer - Plan: Ambulatory referral to Gastroenterology  Dyslipidemia -Last LDL 42 in 9/18. -On lipitor 80.  Benign essential HTN -Well controlled on norvasc.    Patient Instructions  -Nice meeting you today!  -Start protonix 40 mg daily for your acid reflux issues.  -Will send you back to see your cardiologist.  -Schedule follow up for your physical with me at your convenience , please come in fasting for that appointment.     Chaya Jan, MD [facility]

## 2018-10-30 ENCOUNTER — Encounter: Payer: Self-pay | Admitting: Internal Medicine

## 2018-10-30 ENCOUNTER — Ambulatory Visit (INDEPENDENT_AMBULATORY_CARE_PROVIDER_SITE_OTHER): Payer: 59 | Admitting: Internal Medicine

## 2018-10-30 VITALS — BP 130/80 | HR 83 | Temp 98.8°F | Ht 68.0 in | Wt 215.3 lb

## 2018-10-30 DIAGNOSIS — K219 Gastro-esophageal reflux disease without esophagitis: Secondary | ICD-10-CM | POA: Diagnosis not present

## 2018-10-30 DIAGNOSIS — Z1211 Encounter for screening for malignant neoplasm of colon: Secondary | ICD-10-CM

## 2018-10-30 DIAGNOSIS — Z136 Encounter for screening for cardiovascular disorders: Secondary | ICD-10-CM

## 2018-10-30 DIAGNOSIS — I2510 Atherosclerotic heart disease of native coronary artery without angina pectoris: Secondary | ICD-10-CM | POA: Diagnosis not present

## 2018-10-30 DIAGNOSIS — I10 Essential (primary) hypertension: Secondary | ICD-10-CM

## 2018-10-30 DIAGNOSIS — E785 Hyperlipidemia, unspecified: Secondary | ICD-10-CM

## 2018-10-30 DIAGNOSIS — E7849 Other hyperlipidemia: Secondary | ICD-10-CM

## 2018-10-30 DIAGNOSIS — Z1159 Encounter for screening for other viral diseases: Secondary | ICD-10-CM

## 2018-10-30 LAB — LIPID PANEL
CHOLESTEROL: 89 mg/dL (ref 0–200)
HDL: 40.4 mg/dL (ref >39.00)
LDL Cholesterol: 36 mg/dL (ref 0–99)
NonHDL: 49.01
Total CHOL/HDL Ratio: 2
Triglycerides: 65 mg/dL (ref 0.0–149.0)
VLDL: 13 mg/dL (ref 0.0–40.0)

## 2018-10-30 LAB — COMPREHENSIVE METABOLIC PANEL
ALBUMIN: 4.2 g/dL (ref 3.5–5.2)
ALT: 21 U/L (ref 0–53)
AST: 14 U/L (ref 0–37)
Alkaline Phosphatase: 41 U/L (ref 39–117)
BUN: 19 mg/dL (ref 6–23)
CO2: 29 mEq/L (ref 19–32)
Calcium: 9.4 mg/dL (ref 8.4–10.5)
Chloride: 108 mEq/L (ref 96–112)
Creatinine, Ser: 1.28 mg/dL (ref 0.40–1.50)
GFR: 67.52 mL/min (ref >60.00)
Glucose, Bld: 97 mg/dL (ref 70–99)
Potassium: 4.7 mEq/L (ref 3.5–5.1)
Sodium: 143 mEq/L (ref 135–145)
Total Bilirubin: 1 mg/dL (ref 0.2–1.2)
Total Protein: 6.9 g/dL (ref 6.0–8.3)

## 2018-10-30 LAB — CBC WITH DIFFERENTIAL/PLATELET
Basophils Absolute: 0 10*3/uL (ref 0.0–0.1)
Basophils Relative: 0.6 % (ref 0.0–3.0)
EOS ABS: 0.1 10*3/uL (ref 0.0–0.7)
Eosinophils Relative: 1.9 % (ref 0.0–5.0)
HCT: 47.4 % (ref 39.0–52.0)
Hemoglobin: 15.8 g/dL (ref 13.0–17.0)
Lymphocytes Relative: 40.5 % (ref 12.0–46.0)
Lymphs Abs: 2.8 10*3/uL (ref 0.7–4.0)
MCHC: 33.3 g/dL (ref 30.0–36.0)
MCV: 94.6 fl (ref 78.0–100.0)
Monocytes Absolute: 0.4 10*3/uL (ref 0.1–1.0)
Monocytes Relative: 6 % (ref 3.0–12.0)
Neutro Abs: 3.6 10*3/uL (ref 1.4–7.7)
Neutrophils Relative %: 51 % (ref 43.0–77.0)
PLATELETS: 377 10*3/uL (ref 150.0–400.0)
RBC: 5.02 Mil/uL (ref 4.22–5.81)
RDW: 15.4 % (ref 11.5–15.5)
WBC: 7 10*3/uL (ref 4.0–10.5)

## 2018-10-30 LAB — TSH: TSH: 2.82 u[IU]/mL (ref 0.35–4.50)

## 2018-10-30 NOTE — Patient Instructions (Signed)
-  Nice seeing you today!  -Lab work today. Will notify you when results are available.  -Abdominal ultrasound to screen for aneurysm.  -Schedule follow up with Valentina Gu for subsequent medicare wellness visit.  -Follow up with me in 4-6 months.

## 2018-10-30 NOTE — Progress Notes (Signed)
Established Patient Office Visit     CC/Reason for Visit: Follow-up chronic conditions  HPI: Raymond Norris is a 68 y.o. male who is coming in today for the above mentioned reasons. Past Medical History is significant for: HTN, HLD, GERD and CAD. He last saw cards in 2018. He had DES x 2 in June 2016. Was seen in the ED in Dec 2019 for atypical CP thought to be GERD and discharged home.  He continued to have what he thought was GERD and reflux with pain in the center of his chest and epigastric area, sour taste in his mouth and frequent belching.  He requested to be started back on a PPI which we did back in February, he states he has significantly improved.  However I was concerned given his history of coronary artery disease with lack of cardiology follow-up and thought it might be an anginal equivalent especially as his symptoms seem to be more frequent while exerting himself.  Referral to cardiology has already been made and he has an appointment in the next few weeks.  He is doing well today and has no complaints.  He does not have routine eye and dental care.  He is not interested in any vaccinations despite discussing this in detail.  He needs referral to GI for screening colonoscopy.   Past Medical/Surgical History: Past Medical History:  Diagnosis Date  . Coronary artery disease   . GERD 04/26/2008  . Myocardial infarction [facility])     Past Surgical History:  Procedure Laterality Date  . CARDIAC CATHETERIZATION N/A 01/31/2015   Procedure: Left Heart Cath and Coronary Angiography;  Surgeon: Tonny Bollman, MD;  Location: [facility] INVASIVE CV LAB;  Service: Cardiovascular;  Laterality: N/A;  . PERCUTANEOUS CORONARY STENT INTERVENTION (PCI-S)  01/2015   LEFT CIRCUMFLEX  1ST & 2ND   OM BRANCHES    Social History:  reports that he has never smoked. He has never used smokeless tobacco. He reports that he does not drink alcohol or use drugs.  Allergies: No Known Allergies  Family  History:  Family History  Family history unknown: Yes     Current Outpatient Medications:  .  amLODipine (NORVASC) 5 MG tablet, Take 1 tablet (5 mg total) by mouth daily. NEEDS APPOINTMENT FOR FUTURE REFILLS, Disp: 15 tablet, Rfl: 0 .  aspirin 81 MG tablet, Take 81 mg by mouth daily., Disp: , Rfl:  .  fluticasone (FLONASE) 50 MCG/ACT nasal spray, Place 1 spray into both nostrils daily., Disp: 1 g, Rfl: 3 .  Multiple Vitamin (MULTIVITAMIN) tablet, Take 1 tablet by mouth daily., Disp: , Rfl:  .  nitroGLYCERIN (NITROSTAT) 0.4 MG SL tablet, Place 1 tablet (0.4 mg total) under the tongue every 5 (five) minutes x 3 doses as needed for chest pain., Disp: 25 tablet, Rfl: 12 .  pantoprazole (PROTONIX) 40 MG tablet, Take 1 tablet (40 mg total) by mouth daily., Disp: 30 tablet, Rfl: 2 .  atorvastatin (LIPITOR) 80 MG tablet, Take 1 tablet (80 mg total) by mouth daily., Disp: 90 tablet, Rfl: 3  Review of Systems:  Constitutional: Denies fever, chills, diaphoresis, appetite change and fatigue.  HEENT: Denies photophobia, eye pain, redness, hearing loss, ear pain, congestion, sore throat, rhinorrhea, sneezing, mouth sores, trouble swallowing, neck pain, neck stiffness and tinnitus.   Respiratory: Denies SOB, DOE, cough, chest tightness,  and wheezing.   Cardiovascular: Denies chest pain, palpitations and leg swelling.  Gastrointestinal: Denies nausea, vomiting, abdominal pain, diarrhea, constipation, blood  in stool and abdominal distention.  Genitourinary: Denies dysuria, urgency, frequency, hematuria, flank pain and difficulty urinating.  Endocrine: Denies: hot or cold intolerance, sweats, changes in hair or nails, polyuria, polydipsia. Musculoskeletal: Denies myalgias, back pain, joint swelling, arthralgias and gait problem.  Skin: Denies pallor, rash and wound.  Neurological: Denies dizziness, seizures, syncope, weakness, light-headedness, numbness and headaches.  Hematological: Denies adenopathy. Easy  bruising, personal or family bleeding history  Psychiatric/Behavioral: Denies suicidal ideation, mood changes, confusion, nervousness, sleep disturbance and agitation    Physical Exam: Vitals:   10/30/18 0701  BP: 130/80  Pulse: 83  Temp: 98.8 F (37.1 C)  TempSrc: Oral  SpO2: 96%  Weight: 215 lb 4.8 oz (97.7 kg)  Height: 5\' 8"  (1.727 m)    Body mass index is 32.74 kg/m.   Constitutional: NAD, calm, comfortable Eyes: PERRL, lids and conjunctivae normal ENMT: Mucous membranes are moist. Posterior pharynx clear of any exudate or lesions.  Poor dentition. Tympanic membrane is pearly white, no erythema or bulging. Neck: normal, supple, no masses, no thyromegaly Respiratory: clear to auscultation bilaterally, no wheezing, no crackles. Normal respiratory effort. No accessory muscle use.  Cardiovascular: Regular rate and rhythm, no murmurs / rubs / gallops. No extremity edema. 2+ pedal pulses. No carotid bruits.  Abdomen: no tenderness, no masses palpated. No hepatosplenomegaly. Bowel sounds positive.  Musculoskeletal: no clubbing / cyanosis. No joint deformity upper and lower extremities. Good ROM, no contractures. Normal muscle tone.  Skin: no rashes, lesions, ulcers. No induration Neurologic: CN 2-12 grossly intact. Sensation intact, DTR normal. Strength 5/5 in all 4.  Psychiatric: Normal judgment and insight. Alert and oriented x 3. Normal mood.    Impression and Plan:  Gastroesophageal reflux disease, esophagitis presence not specified -Improved on PPI that was started last month.  Benign essential HTN  -Well-controlled.  Coronary artery disease involving native coronary artery of native heart without angina pectoris  -Referral to cardiology is in process.  Dyslipidemia  -Check lipids today, last LDL was 42 in September 2018.  Encounter for hepatitis C screening test for low risk patient - Plan: Hep C Antibody  Encounter for abdominal aortic aneurysm (AAA) screening -  Plan: US AORTA MEDICARE SCREENING     Patient Instructions  -Nice seeing you today!  -Lab work today. Will notify you when results are available.  -Abdominal ultrasound to screen for aneurysm.  -Schedule follow up with Valentina Gu for subsequent medicare wellness visit.  -Follow up with me in 4-6 months.     Chaya Jan, MD [facility] at [facility]

## 2018-10-30 NOTE — Addendum Note (Signed)
Addended by: Kern Reap B on: 10/30/2018 08:05 AM   Modules accepted: Orders

## 2018-10-31 ENCOUNTER — Other Ambulatory Visit: Payer: Self-pay | Admitting: Cardiology

## 2018-10-31 LAB — HEPATITIS C ANTIBODY
Hepatitis C Ab: NONREACTIVE
SIGNAL TO CUT-OFF: 0.04 (ref <1.00)

## 2018-11-04 ENCOUNTER — Other Ambulatory Visit: Payer: Self-pay | Admitting: Cardiology

## 2018-11-04 ENCOUNTER — Telehealth: Payer: Self-pay | Admitting: Cardiology

## 2018-11-04 NOTE — Telephone Encounter (Signed)
error 

## 2018-11-12 ENCOUNTER — Other Ambulatory Visit: Payer: Self-pay

## 2018-11-12 ENCOUNTER — Encounter: Payer: Self-pay | Admitting: Internal Medicine

## 2018-11-12 ENCOUNTER — Ambulatory Visit (INDEPENDENT_AMBULATORY_CARE_PROVIDER_SITE_OTHER): Payer: 59 | Admitting: Internal Medicine

## 2018-11-12 ENCOUNTER — Telehealth: Payer: Self-pay | Admitting: *Deleted

## 2018-11-12 VITALS — BP 110/80 | HR 82 | Temp 98.9°F | Wt 213.0 lb

## 2018-11-12 DIAGNOSIS — R5383 Other fatigue: Secondary | ICD-10-CM

## 2018-11-12 DIAGNOSIS — I2510 Atherosclerotic heart disease of native coronary artery without angina pectoris: Secondary | ICD-10-CM | POA: Diagnosis not present

## 2018-11-12 DIAGNOSIS — R202 Paresthesia of skin: Secondary | ICD-10-CM | POA: Diagnosis not present

## 2018-11-12 LAB — VITAMIN B12: Vitamin B-12: 372 pg/mL (ref 211–911)

## 2018-11-12 LAB — VITAMIN D 25 HYDROXY (VIT D DEFICIENCY, FRACTURES): VITD: 29.57 ng/mL — ABNORMAL LOW (ref 30.00–100.00)

## 2018-11-12 NOTE — Progress Notes (Signed)
Established Patient Office Visit     CC/Reason for Visit: fatigue, tingling in feet  HPI: Raymond Norris is a 68 y.o. male who is coming in today for the above mentioned reasons. Past Medical History is significant for: HTN, HLD, GERD and CAD. He last saw cards in 2018. He had DES x 2 in June 2016. Was seen in the ED in Dec 2019 for atypical CP thought to be GERD and discharged home.  He continued to have what he thought was GERD and reflux with pain in the center of his chest and epigastric area, sour taste in his mouth and frequent belching.  He requested to be started back on a PPI which we did back in February, he states he has significantly improved.  However I was concerned given his history of coronary artery disease with lack of cardiology follow-up and thought it might be an anginal equivalent especially as his symptoms seem to be more frequent while exerting himself.  Referral to cardiology has already been made and he has an appointment on 3/25.  Since visit 2 weeks ago, has noticed excessive fatigue. His wife thinks he must have a vit deficiency and he wants to be checked for that. He has also had tingling of his feet and around his eyes. Interestingly, the fatigue is worse with exertion and so is the tingling.   Past Medical/Surgical History: Past Medical History:  Diagnosis Date  . Coronary artery disease   . GERD 04/26/2008  . Myocardial infarction [facility])     Past Surgical History:  Procedure Laterality Date  . CARDIAC CATHETERIZATION N/A 01/31/2015   Procedure: Left Heart Cath and Coronary Angiography;  Surgeon: Tonny Bollman, MD;  Location: [facility] INVASIVE CV LAB;  Service: Cardiovascular;  Laterality: N/A;  . PERCUTANEOUS CORONARY STENT INTERVENTION (PCI-S)  01/2015   LEFT CIRCUMFLEX  1ST & 2ND   OM BRANCHES    Social History:  reports that he has never smoked. He has never used smokeless tobacco. He reports that he does not drink alcohol or use drugs.  Allergies:  No Known Allergies  Family History:  Family History  Family history unknown: Yes     Current Outpatient Medications:  .  amLODipine (NORVASC) 5 MG tablet, Take 1 tablet (5 mg total) by mouth daily. NEED OV., Disp: 15 tablet, Rfl: 0 .  aspirin 81 MG tablet, Take 81 mg by mouth daily., Disp: , Rfl:  .  atorvastatin (LIPITOR) 80 MG tablet, TAKE 1 TABLET(80 MG) BY MOUTH DAILY, Disp: 30 tablet, Rfl: 0 .  fluticasone (FLONASE) 50 MCG/ACT nasal spray, Place 1 spray into both nostrils daily., Disp: 1 g, Rfl: 3 .  Multiple Vitamin (MULTIVITAMIN) tablet, Take 1 tablet by mouth daily., Disp: , Rfl:  .  nitroGLYCERIN (NITROSTAT) 0.4 MG SL tablet, Place 1 tablet (0.4 mg total) under the tongue every 5 (five) minutes x 3 doses as needed for chest pain., Disp: 25 tablet, Rfl: 12 .  pantoprazole (PROTONIX) 40 MG tablet, Take 1 tablet (40 mg total) by mouth daily., Disp: 30 tablet, Rfl: 2  Review of Systems:  Constitutional: Denies fever, chills, diaphoresis, appetite change. HEENT: Denies photophobia, eye pain, redness, hearing loss, ear pain, congestion, sore throat, rhinorrhea, sneezing, mouth sores, trouble swallowing, neck pain, neck stiffness and tinnitus.   Respiratory: Denies SOB, DOE, cough, chest tightness,  and wheezing.   Cardiovascular: Denies chest pain, palpitations and leg swelling.  Gastrointestinal: Denies nausea, vomiting, abdominal pain, diarrhea, constipation, blood in  stool and abdominal distention.  Genitourinary: Denies dysuria, urgency, frequency, hematuria, flank pain and difficulty urinating.  Endocrine: Denies: hot or cold intolerance, sweats, changes in hair or nails, polyuria, polydipsia. Musculoskeletal: Denies myalgias, back pain, joint swelling, arthralgias and gait problem.  Skin: Denies pallor, rash and wound.  Neurological: Denies dizziness, seizures, syncope, weakness, light-headedness,  and headaches.  Hematological: Denies adenopathy. Easy bruising, personal or  family bleeding history  Psychiatric/Behavioral: Denies suicidal ideation, mood changes, confusion, nervousness, sleep disturbance and agitation    Physical Exam: Vitals:   11/12/18 0908  BP: 110/80  Pulse: 82  Temp: 98.9 F (37.2 C)  TempSrc: Oral  SpO2: 96%  Weight: 213 lb (96.6 kg)    Body mass index is 32.39 kg/m.   Constitutional: NAD, calm, comfortable Eyes: PERRL, lids and conjunctivae normal ENMT: Mucous membranes are moist.  Respiratory: clear to auscultation bilaterally, no wheezing, no crackles. Normal respiratory effort. No accessory muscle use.  Cardiovascular: Regular rate and rhythm, no murmurs / rubs / gallops. No extremity edema. 2+ pedal pulses. No carotid bruits.  Psychiatric: Normal judgment and insight. Alert and oriented x 3. Normal mood.    Impression and Plan:  Fatigue, unspecified type  Coronary artery disease involving native coronary artery of native heart without angina pectoris Tingling of both feet  -I continue to be concerned about an anginal equivalent given exertional fatigue as well as prior chest discomfort although the chest discomfort has somewhat improved with PPI regimen. -He has appointment already scheduled with cardiology on 3/25.  I think he may need to be studied. -EKG has been done in the office today and interpreted by myself as sinus rhythm without acute ST-T wave changes. -Because of tingling of feet in addition to fatigue will check vitamin B12 and vitamin D to rule out these as potential etiologies.     Patient Instructions  -Nice seeing you today.  -EKG is normal.  -Make sure you keep your appointment with cardiology.  -Lab work today. Will notify you of results.       Chaya Jan, MD [facility] at [facility]

## 2018-11-12 NOTE — Patient Instructions (Signed)
-  Nice seeing you today.  -EKG is normal.  -Make sure you keep your appointment with cardiology.  -Lab work today. Will notify you of results.

## 2018-11-12 NOTE — Telephone Encounter (Signed)
Left message on machine to triage patient about current symptoms. Okay for triage nurse to speak with patient CRM

## 2018-11-12 NOTE — Telephone Encounter (Signed)
Patient seen in the office 11/12/2018

## 2018-11-17 ENCOUNTER — Telehealth: Payer: Self-pay

## 2018-11-17 NOTE — Telephone Encounter (Signed)
COVID-19 Pre-Screening Questions:  Provider: Azzie Roup DNP/CRENSHAW   Needs f/u  3 MONTHS

## 2018-11-18 ENCOUNTER — Other Ambulatory Visit: Payer: Self-pay | Admitting: Cardiology

## 2018-11-18 ENCOUNTER — Ambulatory Visit: Payer: 59 | Admitting: Cardiology

## 2018-11-18 NOTE — Telephone Encounter (Signed)
COVID-19 Pre-Screening Questions:  Provider: Lyman Bishop   Needs f/u  N-3-3/25  COVID-19 Pre-Screening Questions:  . Do you currently have a fever? NO (yes = cancel and refer to pcp for e-visit) .  Marland Kitchen Have you recently travelled on a cruise, internationally, or to [place], [place], [place], [place], [place], or [place], [place] [employer]) ? NO (yes = cancel, stay home, monitor symptoms, and contact pcp or initiate e-visit if symptoms develop) .  Marland Kitchen Have you been in contact with someone that is currently pending confirmation of Covid19 testing or has been confirmed to have the Covid19 virus?  NO (yes = cancel, stay home, away from tested individual, monitor symptoms, and contact pcp or initiate e-visit if symptoms develop) .  Marland Kitchen Are you currently experiencing fatigue or cough? NO (yes = pt should be prepared to have a mask placed at the time of their visit).  Cardiac Questionnaire:    Since your last visit or hospitalization:    1. Have you been having new or worsening chest pain? NO   2. Have you been having new or worsening shortness of breath? NO 3. Have you been having new or worsening leg swelling, wt gain, or increase in abdominal girth (pants fitting more tightly)? NO   4. Have you had any passing out spells? NO    *A YES to any of these questions would result in the appointment being kept. *If all the answers to these questions are NO, we should indicate that given the current situation regarding the worldwide coronarvirus pandemic, at the recommendation of the CDC, we are looking to limit gatherings in our waiting area, and thus will reschedule their appointment beyond four weeks from today.   _____________

## 2018-11-19 ENCOUNTER — Ambulatory Visit: Payer: 59 | Admitting: Adult Health

## 2018-11-24 ENCOUNTER — Other Ambulatory Visit: Payer: 59

## 2018-12-02 ENCOUNTER — Other Ambulatory Visit: Payer: Self-pay | Admitting: Cardiology

## 2018-12-02 NOTE — Telephone Encounter (Signed)
Atorvastatin refilled.  

## 2018-12-03 NOTE — Telephone Encounter (Signed)
LM2CB to schedule appt for telephone/video visit w/ Samara Deist lawrence

## 2018-12-04 NOTE — Telephone Encounter (Signed)
S/w pt he would like a telphone visit. BP NO  scale  NO  MyChart NO  email NO    In the setting of the current Covid19 crisis, you are scheduled for a TELEPHONE visit with your provider on 12-08-2018 at 8AM.  Just as we do with many in-office visits, in order for you to participate in this visit, we must obtain consent.  If you'd like, I can send this to your mychart (if signed up) or email for you to review.  Otherwise, I can obtain your verbal consent now.  All virtual visits are billed to your insurance company just like a normal visit would be.  By agreeing to a virtual visit, we'd like you to understand that the technology does not allow for your provider to perform an examination, and thus may limit your provider's ability to fully assess your condition.  Finally, though the technology is pretty good, we cannot assure that it will always work on either your or our end, and in the setting of a video visit, we may have to convert it to a phone-only visit.  In either situation, we cannot ensure that we have a secure connection.  Are you willing to proceed?  Verbalized consent.

## 2018-12-05 ENCOUNTER — Telehealth: Payer: Self-pay | Admitting: Adult Health

## 2018-12-05 NOTE — Telephone Encounter (Signed)
Smartphone/ my chart/ per reg completed

## 2018-12-07 NOTE — Progress Notes (Deleted)
{Choose 1 Note Type (Telehealth Visit or Telephone Visit):[id number]}   Evaluation Performed:  Follow-up visit  This visit type was conducted due to national recommendations for restrictions regarding the COVID-19 Pandemic (e.g. social distancing).  This format is felt to be most appropriate for this patient at this time.  All issues noted in this document were discussed and addressed.  No physical exam was performed (except for noted visual exam findings with Telehealth visits).  See MyChart message from today for the patient's consent to telehealth for [facility].   Date:  12/07/2018   ID:  Raymond Norris, DOB 12/19/1950, MRN [id number]  Patient [place]  [address] [place] [id number]   [place]   [place]   PCP:  Philip AspenHernandez Acosta, Limmie PatriciaEstela Y, MD  Cardiologist:  Dr.Crenshaw  Electrophysiologist:  None   Chief Complaint:  ***  History of Present Illness:    Raymond Norris is a 68 y.o. male who presents via audio/video conferencing for a telehealth visit today, for ongoing assessment and management of coronary artery disease, most recent cardiac catheterization in 2016 revealed a 100% second marginal and a 90% first marginal.  As result he had PCI with drug-eluting stents placed to both lesions.  There was no obstructive disease in the right coronary artery or LAD; hypertension, hyperlipidemia, with other history to includes GERD & ED.  Dr. Jens Somrenshaw did not object to him using Viagra but did educate him concerning use of nitroglycerin within 24 hours.   The patient was last seen in the office by Dr. Jens Somrenshaw on 05/10/2017.  At the time of that last office visit no medication changes were made.  We are completing a telephone visit at the request of the patient as he does not have Internet access or a smart phone.  The patient {does/does not:200015} symptoms concerning for COVID-19 infection (fever, chills, cough, or new SHORTNESS OF BREATH).    Prior  CV studies:   The following studies were reviewed today:  ***  Past Medical History:  Diagnosis Date  . Coronary artery disease   . GERD 04/26/2008  . Myocardial infarction [facility](HCC)    Past Surgical History:  Procedure Laterality Date  . CARDIAC CATHETERIZATION N/A 01/31/2015   Procedure: Left Heart Cath and Coronary Angiography;  Surgeon: Tonny BollmanMichael Cooper, MD;  [facility];  Service: Cardiovascular;  Laterality: N/A;  . PERCUTANEOUS CORONARY STENT INTERVENTION (PCI-S)  01/2015   LEFT CIRCUMFLEX  1ST & 2ND   OM BRANCHES     No outpatient medications have been marked as taking for the 12/08/18 encounter (Appointment) with Jodelle GrossLawrence, Nilda Keathley M, NP.     Allergies:   Patient has no known allergies.   Social History   Tobacco Use  . Smoking status: Never Smoker  . Smokeless tobacco: Never Used  Substance Use Topics  . Alcohol use: No  . Drug use: No     Family Hx: The patient's Family history is unknown by patient.  ROS:   Please see the history of present illness.    *** All other systems reviewed and are negative.   Labs/Other Tests and Data Reviewed:    Recent Labs: 10/30/2018: ALT 21; BUN 19; Creatinine, Ser 1.28; Hemoglobin 15.8; Platelets 377.0; Potassium 4.7; Sodium 143; TSH 2.82   Recent Lipid Panel Lab Results  Component Value Date/Time   CHOL 89 10/30/2018 07:42 AM   CHOL 95 (L) 05/10/2017 08:29 AM   TRIG 65.0 10/30/2018 07:42 AM   HDL  40.40 10/30/2018 07:42 AM   HDL 37 (L) 05/10/2017 08:29 AM   CHOLHDL 2 10/30/2018 07:42 AM   LDLCALC 36 10/30/2018 07:42 AM   LDLCALC 42 05/10/2017 08:29 AM    Wt Readings from Last 3 Encounters:  11/12/18 213 lb (96.6 kg)  10/30/18 215 lb 4.8 oz (97.7 kg)  10/23/18 214 lb (97.1 kg)     Exam:    Vital Signs:  There were no vitals taken for this visit.   Well nourished, well developed male in no*** acute distress. ***  ASSESSMENT & PLAN:    1.  ***  COVID-19 Education: The signs and symptoms of  COVID-19 were discussed with the patient and how to seek care for testing (follow up with PCP or arrange E-visit).  ***The importance of social distancing was discussed today.  Patient Risk:   After full review of this patients clinical status, I feel that they are at least moderate risk at this time.  Time:   Today, I have spent *** minutes with the patient with telehealth technology discussing ***.     Medication Adjustments/Labs and Tests Ordered: Current medicines are reviewed at length with the patient today.  Concerns regarding medicines are outlined above.  Tests Ordered: No orders of the defined types were placed in this encounter.   Medication Changes: No orders of the defined types were placed in this encounter.   Disposition:  {follow up:15908}  Signed, Bettey Mare. Liborio Nixon, ANP, AACC  12/07/2018 4:02 PM    [facility] [address] [place], [place], [place]  [address] Phone: [phone]; Fax: [phone]

## 2018-12-08 ENCOUNTER — Telehealth (INDEPENDENT_AMBULATORY_CARE_PROVIDER_SITE_OTHER): Payer: 59 | Admitting: Adult Health

## 2018-12-08 ENCOUNTER — Telehealth: Payer: 59 | Admitting: Adult Health

## 2018-12-08 ENCOUNTER — Ambulatory Visit: Payer: 59 | Admitting: Cardiology

## 2018-12-08 ENCOUNTER — Encounter

## 2018-12-08 ENCOUNTER — Ambulatory Visit: Payer: 59 | Admitting: Adult Health

## 2018-12-08 ENCOUNTER — Encounter: Payer: Self-pay | Admitting: Adult Health

## 2018-12-08 VITALS — BP 147/66 | HR 43

## 2018-12-08 DIAGNOSIS — I2510 Atherosclerotic heart disease of native coronary artery without angina pectoris: Secondary | ICD-10-CM

## 2018-12-08 DIAGNOSIS — I10 Essential (primary) hypertension: Secondary | ICD-10-CM | POA: Diagnosis not present

## 2018-12-08 DIAGNOSIS — E7800 Pure hypercholesterolemia, unspecified: Secondary | ICD-10-CM

## 2018-12-08 NOTE — Progress Notes (Addendum)
Virtual Visit via Telephone Note   This visit type was conducted due to national recommendations for restrictions regarding the COVID-19 Pandemic (e.g. social distancing) in an effort to limit this patient's exposure and mitigate transmission in our community.  Due to his co-morbid illnesses, this patient is at least at moderate risk for complications without adequate follow up.  This format is felt to be most appropriate for this patient at this time.  The patient did not have access to video technology/had technical difficulties with video requiring transitioning to audio format only (telephone).  All issues noted in this document were discussed and addressed.  No physical exam could be performed with this format.  Please refer to the patient's chart for his  consent to telehealth for [facility].   Evaluation Performed:  Follow-up visit  This visit type was conducted due to national recommendations for restrictions regarding the COVID-19 Pandemic (e.g. social distancing).  This format is felt to be most appropriate for this patient at this time.  All issues noted in this document were discussed and addressed.  No physical exam was performed (except for noted visual exam findings with Telehealth visits).  See MyChart message from today for the patient's consent to telehealth for [facility].  Patient has given verbal permission to conduct this visit via virtual appointment and to bill insurance 12/08/2018 11:19 AM      Date:  12/08/2018   ID:  Raymond Norris, DOB 1950-10-12, MRN [id number]  Patient Location:  [address] [name] [address]   Provider location:   [place], [place]-Home office   PCP:  Philip Aspen, Limmie Patricia, MD  Cardiologist:  Dr.Crenshaw  Electrophysiologist:  None   Chief Complaint:  Follow up  History of Present Illness:    Raymond Norris is a 68 y.o. male who presents via audio/video conferencing for a telehealth visit today, for ongoing assessment and  management of coronary artery disease, most recent cardiac catheterization in 2016 revealed a 100% second marginal and a 90% first marginal.  As result he had PCI with drug-eluting stents placed to both lesions.  There was no obstructive disease in the right coronary artery or LAD; hypertension, hyperlipidemia, with other history to includes GERD & ED.  Dr. Jens Som did not object to him using Viagra but did educate him concerning use of nitroglycerin within 24 hours.   The patient was last seen in the office by Dr. Jens Som on 05/10/2017.  At the time of that last office visit no medication changes were made.  We are completing a telephone visit at the request of the patient as he does not have Internet access or a smart phone.  The patient does not have symptoms concerning for COVID-19 infection (fever, chills, cough, or new SHORTNESS OF BREATH).    Prior CV studies:   The following studies were reviewed today:  Cardiac Cath 01/31/2015 Conclusion   1. 2nd Mrg lesion, 100% stenosed. There is a 0% residual stenosis post intervention. 2. A drug-eluting stent was placed. 3. 1st Mrg lesion, 90% stenosed. There is a 0% residual stenosis post intervention. 4. A drug-eluting stent was placed.   FINAL CONCLUSIONS:  SEVERE SINGLE VESSEL CAD INVOLVING MULTIPLE BRANCHES OF THE LEFT CIRCUMFLEX WITH SUCCESSFUL PCI OF THE FIRST AND SECOND OM BRANCHES  MILD DIFFUSE NONOBSTRUCTIVE DISEASE INVOLVING THE RCA AND LAD   MODERATE DIAGONAL BRANCH STENOSIS  MILD SEGMENTAL LV CONTRACTION ABNORMALITY  RECOMMENDATIONS:  CONTINUE AGGRESSIVE MEDICAL MANAGEMENT  DAPT WITH ASA AND BRILINTA  X 12 MONTHS MINIMUM     Past Medical History:  Diagnosis Date   Coronary artery disease    GERD 04/26/2008   Myocardial infarction [facility])    Past Surgical History:  Procedure Laterality Date   CARDIAC CATHETERIZATION N/A 01/31/2015   Procedure: Left Heart Cath and Coronary Angiography;  Surgeon: Tonny Bollman, MD;   Location: [facility] INVASIVE CV LAB;  Service: Cardiovascular;  Laterality: N/A;   PERCUTANEOUS CORONARY STENT INTERVENTION (PCI-S)  01/2015   LEFT CIRCUMFLEX  1ST & 2ND   OM BRANCHES     No outpatient medications have been marked as taking for the 12/08/18 encounter (Appointment) with Jodelle Gross, NP.     Allergies:   Patient has no known allergies.   Social History   Tobacco Use   Smoking status: Never Smoker   Smokeless tobacco: Never Used  Substance Use Topics   Alcohol use: No   Drug use: No     Family Hx: The patient's Family history is unknown by patient.  ROS:   Please see the history of present illness.    All other systems reviewed and are negative. Has chronic pain.    Labs/Other Tests and Data Reviewed:    Recent Labs: 10/30/2018: ALT 21; BUN 19; Creatinine, Ser 1.28; Hemoglobin 15.8; Platelets 377.0; Potassium 4.7; Sodium 143; TSH 2.82   Recent Lipid Panel Lab Results  Component Value Date/Time   CHOL 89 10/30/2018 07:42 AM   CHOL 95 (L) 05/10/2017 08:29 AM   TRIG 65.0 10/30/2018 07:42 AM   HDL 40.40 10/30/2018 07:42 AM   HDL 37 (L) 05/10/2017 08:29 AM   CHOLHDL 2 10/30/2018 07:42 AM   LDLCALC 36 10/30/2018 07:42 AM   LDLCALC 42 05/10/2017 08:29 AM    Wt Readings from Last 3 Encounters:  11/12/18 213 lb (96.6 kg)  10/30/18 215 lb 4.8 oz (97.7 kg)  10/23/18 214 lb (97.1 kg)     Exam:    Vital Signs:  147/66 HR 43 bpm. (Took it while on the phone with me with the help of his nephew).  Unable to assess over the phone. He does not appear dyspneic when speaking to me, he answers and understands questions appropriately.   ASSESSMENT & PLAN:    1.  CAD: Hx of PCI to the 1st and 2nd diagaonal. He denies recurrent chest pain, or DOE. He is not very physically active due to hip replacement and need to use walker for ambulation. He will continue current medication regimen as directed.   2. Hypertension: Took BP at home with the help of his nephew.  Slightly elevated today but he has not had his medications yet. HR was bradycardic at 47 bpm. He is not on any AV nodal blocking agents on review of his medications. He is asymptomatic with this.   3. Hypercholesterolemia; Continues on atorvastatin. He is uncertain if his chronic aches and pains are related to his arthritis vs the statin therapy. He continues to take the atorvastatin. Will need follow up labs on next office visit.   4. Chronic Pain: Is being managed by Dr. Tollie Eth. 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 Pain with some exertion, musculoskeletal.     COVID-19 Education: The signs and symptoms of COVID-19 were discussed with the patient and how to seek care for testing (follow up with PCP or arrange E-visit).  The importance of social distancing was discussed today.  Patient Risk:   After full review of this patients clinical status, I  feel that they are at least moderate risk at this time.  Time:   Today, I have spent 15 minutes with the patient with telehealth technology discussing current status and diagnosis.     Medication Adjustments/Labs and Tests Ordered: Current medicines are reviewed at length with the patient today.  Concerns regarding medicines are outlined above.    Tests Ordered: No orders of the defined types were placed in this encounter.   Medication Changes: No orders of the defined types were placed in this encounter.   Disposition:  4-6 months with labs.Call sooner if he has symptoms.   Signed, Bettey MareKathryn M. Liborio NixonLawrence DNP, ANP, AACC   12/07/2018 4:02 PM    [facility] [address], [place], [place]  [address] Phone: [phone]; Fax: [phone]

## 2018-12-08 NOTE — Progress Notes (Signed)
Virtual Visit via Telephone Note   This visit type was conducted due to national recommendations for restrictions regarding the COVID-19 Pandemic (e.g. social distancing) in an effort to limit this patient's exposure and mitigate transmission in our community.  Due to his co-morbid illnesses, this patient is at least at moderate risk for complications without adequate follow up.  This format is felt to be most appropriate for this patient at this time.  The patient did not have access to video technology/had technical difficulties with video requiring transitioning to audio format only (telephone).  All issues noted in this document were discussed and addressed.  No physical exam could be performed with this format.  Please refer to the patient's chart for his  consent to telehealth for [facility].   Evaluation Performed:  Follow-up visit  This visit type was conducted due to national recommendations for restrictions regarding the COVID-19 Pandemic (e.g. social distancing).  This format is felt to be most appropriate for this patient at this time.  All issues noted in this document were discussed and addressed.  No physical exam was performed (except for noted visual exam findings with Telehealth visits).  See MyChart message from today for the patient's consent to telehealth for [facility].  Patient has given verbal permission to conduct this visit via virtual appointment and to bill insurance 12/08/2018 1:48 PM     Date:  12/08/2018   ID:  Raymond Norris, DOB 10-21-1950, MRN [id number]  Patient Location:  [address] [name] [address]   Provider location:   [place], [place]-Home office   PCP:  Philip Aspen, Limmie Patricia, MD  Cardiologist:  Olga Millers, MD  Electrophysiologist:  None   Chief Complaint:  Follow up  History of Present Illness:    Raymond Norris is a 68 y.o. male who presents via audio/video conferencing for a telehealth visit today, for ongoing assessment  and management of coronary artery disease, most recent cardiac catheterization in 2016 revealing 100% second marginal 90% first marginal.  As result he had PCI with DES to both lesions.  There was no obstructive disease in the right coronary artery or LAD.  He also has a history of hypertension hyperlipidemia and GERD.  The patient was last seen in the office by Dr. Jens Som on 05/11/2007 at that time no medication changes were made.  The patient is since been made an appointment to see gastroenterologist to evaluate him with a an EGD due to ongoing complaints of GERD.  The patient is without any cardiac complaints today.  He does have some mild dyspnea on exertion but admits to not being very active.  He is medically compliant.  He is followed up with his primary care physician with recent labs.  The patient does not have  symptoms concerning for COVID-19 infection (fever, chills, cough, or new SHORTNESS OF BREATH).    Prior CV studies:   The following studies were reviewed today:  Conclusion   1. 2nd Mrg lesion, 100% stenosed. There is a 0% residual stenosis post intervention. 2. A drug-eluting stent was placed. 3. 1st Mrg lesion, 90% stenosed. There is a 0% residual stenosis post intervention. 4. A drug-eluting stent was placed.   FINAL CONCLUSIONS:  SEVERE SINGLE VESSEL CAD INVOLVING MULTIPLE BRANCHES OF THE LEFT CIRCUMFLEX WITH SUCCESSFUL PCI OF THE FIRST AND SECOND OM BRANCHES  MILD DIFFUSE NONOBSTRUCTIVE DISEASE INVOLVING THE RCA AND LAD   MODERATE DIAGONAL BRANCH STENOSIS  MILD SEGMENTAL LV CONTRACTION ABNORMALITY  RECOMMENDATIONS:  CONTINUE AGGRESSIVE MEDICAL MANAGEMENT  DAPT WITH ASA AND BRILINTA X 12 MONTHS MINIMUM     Past Medical History:  Diagnosis Date  . Coronary artery disease   . GERD 04/26/2008  . Myocardial infarction [facility](HCC)    Past Surgical History:  Procedure Laterality Date  . CARDIAC CATHETERIZATION N/A 01/31/2015   Procedure: Left Heart Cath and  Coronary Angiography;  Surgeon: Tonny BollmanMichael Cooper, MD;  [facility];  Service: Cardiovascular;  Laterality: N/A;  . PERCUTANEOUS CORONARY STENT INTERVENTION (PCI-S)  01/2015   LEFT CIRCUMFLEX  1ST & 2ND   OM BRANCHES     No outpatient medications have been marked as taking for the 12/08/18 encounter (Telemedicine) with Jodelle GrossLawrence, Karagan Lehr M, NP.     Allergies:   Patient has no known allergies.   Social History   Tobacco Use  . Smoking status: Never Smoker  . Smokeless tobacco: Never Used  Substance Use Topics  . Alcohol use: No  . Drug use: No     Family Hx: The patient's Family history is unknown by patient.  ROS:   Please see the history of present illness.    All other systems reviewed and are negative.   Labs/Other Tests and Data Reviewed:    Recent Labs: 10/30/2018: ALT 21; BUN 19; Creatinine, Ser 1.28; Hemoglobin 15.8; Platelets 377.0; Potassium 4.7; Sodium 143; TSH 2.82   Recent Lipid Panel Lab Results  Component Value Date/Time   CHOL 89 10/30/2018 07:42 AM   CHOL 95 (L) 05/10/2017 08:29 AM   TRIG 65.0 10/30/2018 07:42 AM   HDL 40.40 10/30/2018 07:42 AM   HDL 37 (L) 05/10/2017 08:29 AM   CHOLHDL 2 10/30/2018 07:42 AM   LDLCALC 36 10/30/2018 07:42 AM   LDLCALC 42 05/10/2017 08:29 AM    Wt Readings from Last 3 Encounters:  11/12/18 213 lb (96.6 kg)  10/30/18 215 lb 4.8 oz (97.7 kg)  10/23/18 214 lb (97.1 kg)     Exam:    Vital Signs:  BP (!) 147/66   Pulse (!) 43    Well nourished, well developed male in no acute distress. Difficult assessment due to phone note. He did not sound dyspneic when speaking to me. He was able to understand and answer questions appropriately.   ASSESSMENT & PLAN:    1.  CAD:  DES to the 1st and 2nd marginal. No further complaints cardiac chest pain. He is not very active, but states that he thinks is more associated with his stomach. He is medically compliant. Has seen his PCP and has had refills and labs.   2.  Hypertension: BP is well controlled. No further adjustments in his medications.   3. GERD: Seeing GI specialist with planned EDG in June of 2020.   COVID-19 Education: The signs and symptoms of COVID-19 were discussed with the patient and how to seek care for testing (follow up with PCP or arrange E-visit).  The importance of social distancing was discussed today.  Patient Risk:   After full review of this patients clinical status, I feel that they are at least moderate risk at this time.  Time:   Today, I have spent 10 minutes with the patient with telehealth technology discussing diagnosis and symptoms. .     Medication Adjustments/Labs and Tests Ordered: Current medicines are reviewed at length with the patient today.  Concerns regarding medicines are outlined above.   Tests Ordered: No orders of the defined types were placed in this encounter.  Medication Changes: No orders of the defined types were placed in this encounter.   Disposition: 4-5 months unless symptomatic   Signed, Bettey Mare. Liborio Nixon, ANP, AACC  12/08/2018 1:44 PM    [facility] [address] [place], [place], [place]  [address] Phone: [phone]; Fax: [phone]

## 2018-12-08 NOTE — Patient Instructions (Signed)
Follow-Up: You will need a follow up appointment in 4 months.  Please call our office 2 months in advance, June 2020 to schedule this, August 2020 appointment.  You may see Brian Crenshaw, MD Kathryn Lawrence, DNP, AACC  or one of the following Advanced Practice Providers on your designated Care Team:  Luke Kilroy, PA-C  Krista Kroeger, PA-C  Callie Goodrich, PA-C       Medication Instructions:  NO CHANGES- Your physician recommends that you continue on your current medications as directed. Please refer to the Current Medication list given to you today. If you need a refill on your cardiac medications before your next appointment, please call your pharmacy. Labwork: When you have labs (blood work) and your tests are completely normal, you will receive your results ONLY by MyChart Message (if you have MyChart) -OR- A paper copy in the mail.  At [facility], you and your health needs are our priority.  As part of our continuing mission to provide you with exceptional heart care, we have created designated Provider Care Teams.  These Care Teams include your primary Cardiologist (physician) and Advanced Practice Providers (APPs -  Physician Assistants and Nurse Practitioners) who all work together to provide you with the care you need, when you need it.  Thank you for choosing [facility] at [facility]!!       

## 2018-12-09 NOTE — Telephone Encounter (Signed)
F/U Message              Patient is calling because he has questions about the: "Nitroglycerin"Medication" Pls call to advise.

## 2018-12-09 NOTE — Telephone Encounter (Signed)
Pt was questioning when to use NTG Informed pt just to take on as needed basis if experiencing any chest ,jaw,neck pain or if radiating pain to arm Pt verbalizes understanding ./cy

## 2018-12-10 ENCOUNTER — Other Ambulatory Visit: Payer: 59

## 2019-01-09 ENCOUNTER — Ambulatory Visit: Payer: 59 | Admitting: Cardiology

## 2019-01-13 ENCOUNTER — Other Ambulatory Visit: Payer: 59

## 2019-01-17 ENCOUNTER — Other Ambulatory Visit: Payer: Self-pay | Admitting: Internal Medicine

## 2019-01-17 DIAGNOSIS — K219 Gastro-esophageal reflux disease without esophagitis: Secondary | ICD-10-CM

## 2019-01-22 ENCOUNTER — Telehealth: Payer: Self-pay | Admitting: Cardiology

## 2019-01-22 NOTE — Telephone Encounter (Signed)
Will need APP virtual fuov Raymond Norris

## 2019-01-22 NOTE — Telephone Encounter (Signed)
Spoke with pt. He report that he hasn't had as much energy as normal and after intercourse he feels SOB, tired, and just doesn't feel right. He state because of the feeling it's been over 2 months. He state he made pcp aware and they recommended for him to contact his cardiologist to make sure it's not cardiac related. Will route to MD for recommendations.

## 2019-01-22 NOTE — Telephone Encounter (Signed)
° °  Patient calling to report low energy, no energy for sexual intercourse for 2 months. Patient states he spoke with PCP who advised him to call [facility] No other symptoms

## 2019-01-23 NOTE — Telephone Encounter (Signed)
Spoke with pt, Follow up scheduled  

## 2019-01-26 ENCOUNTER — Other Ambulatory Visit: Payer: 59

## 2019-01-26 ENCOUNTER — Telehealth: Payer: Self-pay | Admitting: Adult Health

## 2019-01-26 NOTE — Progress Notes (Signed)
  Error , seen by [facility] NP.

## 2019-01-26 NOTE — Telephone Encounter (Signed)
smartphone, pre-reg complete, mychart pending, verbal consent given 01/26/2019 MS °

## 2019-01-27 ENCOUNTER — Telehealth (INDEPENDENT_AMBULATORY_CARE_PROVIDER_SITE_OTHER): Payer: 59 | Admitting: General Practice

## 2019-01-27 VITALS — Wt 200.0 lb

## 2019-01-27 DIAGNOSIS — K219 Gastro-esophageal reflux disease without esophagitis: Secondary | ICD-10-CM

## 2019-01-27 DIAGNOSIS — R5383 Other fatigue: Secondary | ICD-10-CM

## 2019-01-27 DIAGNOSIS — I10 Essential (primary) hypertension: Secondary | ICD-10-CM

## 2019-01-27 DIAGNOSIS — I2510 Atherosclerotic heart disease of native coronary artery without angina pectoris: Secondary | ICD-10-CM

## 2019-01-27 MED ORDER — NITROGLYCERIN 0.4 MG SL SUBL: 0.4000 mg | SUBLINGUAL_TABLET | SUBLINGUAL | 12 refills | Status: DC | PRN

## 2019-01-27 NOTE — Patient Instructions (Addendum)
3 DAY ZIO PATCH MONITOR-THEY WILL MAIL THIS TO YOU WITH INSTRUCTIONS   Follow-Up:  You will need a follow up appointment in 3-4 months.  You may see Olga Millers, MD or one of the following Advanced Practice Providers on your designated Care Team:  Theodore Demark, PA-C  Joni Reining, DNP, ANP    Medication Instructions:  NO CHANGES- Your physician recommends that you continue on your current medications as directed. Please refer to the Current Medication list given to you today. If you need a refill on your cardiac medications before your next appointment, please call your pharmacy. Labwork: When you have labs (blood work) and your tests are completely normal, you will receive your results ONLY by MyChart Message (if you have MyChart) -OR- A paper copy in the mail.  At [facility], you and your health needs are our priority.  As part of our continuing mission to provide you with exceptional heart care, we have created designated Provider Care Teams.  These Care Teams include your primary Cardiologist (physician) and Advanced Practice Providers (APPs -  Physician Assistants and Nurse Practitioners) who all work together to provide you with the care you need, when you need it.  Thank you for choosing [facility] at [facility]!!

## 2019-01-27 NOTE — Progress Notes (Signed)
Virtual Visit via Video Note   This visit type was conducted due to national recommendations for restrictions regarding the COVID-19 Pandemic (e.g. social distancing) in an effort to limit this patient's exposure and mitigate transmission in our community.  Due to his co-morbid illnesses, this patient is at least at moderate risk for complications without adequate follow up.  This format is felt to be most appropriate for this patient at this time.  All issues noted in this document were discussed and addressed.  A limited physical exam was performed with this format.  Please refer to the patient's chart for his consent to telehealth for [facility].  Evaluation Performed:  Follow-up visit  This visit type was conducted due to national recommendations for restrictions regarding the COVID-19 Pandemic (e.g. social distancing).  This format is felt to be most appropriate for this patient at this time.  All issues noted in this document were discussed and addressed.  No physical exam was performed (except for noted visual exam findings with Video Visits).  Please refer to the patient's chart (MyChart message for video visits and phone note for telephone visits) for the patient's consent to telehealth for [facility]  Date:  01/27/2019   ID:  Raymond Norris, DOB Nov 05, 1950, MRN [id number]  Patient Location:  [address] [name] [address]   Provider location:   [facility] [address]  [place], [place] [address]  PCP:  Philip Aspen, Limmie Patricia, MD  Cardiologist:  Olga Millers, MD  Electrophysiologist:  None   Chief Complaint:  fatigue  History of Present Illness:    Raymond Norris is a 68 y.o. male who presents via audio/video conferencing for a telehealth visit today.  Patient verified DOB and address.  He has PMH of coronary artery disease, most recent cardiac catheterization in 2016 revealed a 100% second marginal and a 90% first marginal. As result he had PCI with  drug-eluting stents placed to both lesions. There was no obstructive disease in the right coronary artery or LAD; hypertension, hyperlipidemia, with other history to includes GERD &ED.   He was seen via telemedicine visit on 12/08/2018 and was doing well. However, he called our office on 01/22/2019 reporting that he has had decreased energy, more dyspnea after having intercourse, and worsening symptoms over the last two months. He saw his PCP who recommended he be seen by cardiology. Due to full schedule, Dr.Crenshaw requested he be seen by me today.  Today he does not feel fatigued.  He notices that he experiences  fatigue after intercourse.  He states he has noticed the fatigue symptoms for more than 2 months.  He states that the symptom usually lasts 1 hour to half of a day then subsides.  The only other time the symptoms were present was after he put down his granddaughter.  He denies chest pain, shortness of breath, palpitations, abdominal, and lower extremity edema.  The patient does not symptoms concerning for COVID-19 infection (fever, chills, cough, or new SHORTNESS OF BREATH).    Prior CV studies:   The following studies were reviewed today: Cardiac Cath 01/31/2015 Conclusion   1. 2nd Mrg lesion, 100% stenosed. There is a 0% residual stenosis post intervention. 2. A drug-eluting stent was placed. 3. 1st Mrg lesion, 90% stenosed. There is a 0% residual stenosis post intervention. 4. A drug-eluting stent was placed.  FINAL CONCLUSIONS:  SEVERE SINGLE VESSEL CAD INVOLVING MULTIPLE BRANCHES OF THE LEFT CIRCUMFLEX WITH SUCCESSFUL PCI OF THE FIRST AND SECOND  OM BRANCHES  MILD DIFFUSE NONOBSTRUCTIVE DISEASE INVOLVING THE RCA AND LAD   MODERATE DIAGONAL BRANCH STENOSIS  MILD SEGMENTAL LV CONTRACTION ABNORMALITY  RECOMMENDATIONS:  CONTINUE AGGRESSIVE MEDICAL MANAGEMENT  DAPT WITH ASA AND BRILINTA X 12 MONTHS MINIMUM    EKG 11/12/2018-normal sinus rhythm, 08/02/2018- normal sinus  rhythm  Past Medical History:  Diagnosis Date  . Coronary artery disease   . GERD 04/26/2008  . Myocardial infarction [facility](HCC)    Past Surgical History:  Procedure Laterality Date  . CARDIAC CATHETERIZATION N/A 01/31/2015   Procedure: Left Heart Cath and Coronary Angiography;  Surgeon: Tonny BollmanMichael Cooper, MD;  [facility];  Service: Cardiovascular;  Laterality: N/A;  . PERCUTANEOUS CORONARY STENT INTERVENTION (PCI-S)  01/2015   LEFT CIRCUMFLEX  1ST & 2ND   OM BRANCHES     Current Meds  Medication Sig  . amLODipine (NORVASC) 5 MG tablet Take 1 tablet (5 mg total) by mouth daily. SCHEDULE OV FOR FURTHER REFILLS.  Marland Kitchen. aspirin 81 MG tablet Take 81 mg by mouth daily.  Marland Kitchen. atorvastatin (LIPITOR) 80 MG tablet TAKE 1 TABLET(80 MG) BY MOUTH DAILY  . Multiple Vitamin (MULTIVITAMIN) tablet Take 1 tablet by mouth daily.  . nitroGLYCERIN (NITROSTAT) 0.4 MG SL tablet Place 1 tablet (0.4 mg total) under the tongue every 5 (five) minutes x 3 doses as needed for chest pain.  . [DISCONTINUED] nitroGLYCERIN (NITROSTAT) 0.4 MG SL tablet Place 1 tablet (0.4 mg total) under the tongue every 5 (five) minutes x 3 doses as needed for chest pain.     Allergies:   Patient has no known allergies.   Social History   Tobacco Use  . Smoking status: Never Smoker  . Smokeless tobacco: Never Used  Substance Use Topics  . Alcohol use: No  . Drug use: No     Family Hx: The patient's Family history is unknown by patient.  ROS:   Please see the history of present illness.     All other systems reviewed and are negative.   Labs/Other Tests and Data Reviewed:    Recent Labs: 10/30/2018: ALT 21; BUN 19; Creatinine, Ser 1.28; Hemoglobin 15.8; Platelets 377.0; Potassium 4.7; Sodium 143; TSH 2.82   Recent Lipid Panel Lab Results  Component Value Date/Time   CHOL 89 10/30/2018 07:42 AM   CHOL 95 (L) 05/10/2017 08:29 AM   TRIG 65.0 10/30/2018 07:42 AM   HDL 40.40 10/30/2018 07:42 AM   HDL 37 (L)  05/10/2017 08:29 AM   CHOLHDL 2 10/30/2018 07:42 AM   LDLCALC 36 10/30/2018 07:42 AM   LDLCALC 42 05/10/2017 08:29 AM    Wt Readings from Last 3 Encounters:  01/27/19 200 lb (90.7 kg)  11/12/18 213 lb (96.6 kg)  10/30/18 215 lb 4.8 oz (97.7 kg)     Exam:    Vital Signs:  Wt 200 lb (90.7 kg)   BMI 30.41 kg/m    Well nourished, well developed male in no  acute distress.   ASSESSMENT & PLAN:    1.  Fatigue Order 3-day ZIO patch Increase physical activity as tolerated. Limit evening caffeine Practice sleep hygiene PCP following up with labs (may want to evaluate testosterone level) and medications ( next appt. June 2020).  2. CAD Continue aspirin 81 mg tablet daily Continue nitroglycerin 0.4 mg sublingual tablet as needed  3.  Hypertension Continue amlodipine 5 mg tablet daily  4.  GERD Continue pantoprazole 40 mg tablet daily   COVID-19 Education: The signs and symptoms of  COVID-19 were discussed with the patient and how to seek care for testing (follow up with PCP or arrange E-visit).  The importance of social distancing was discussed today.  Patient Risk:   After full review of this patients clinical status, I feel that they are at least moderate risk at this time.  Time:   Today, I have spent 12 minutes with the patient with telehealth technology discussing fatigue, sleep hygiene, heart rate and rhythm.     Medication Adjustments/Labs and Tests Ordered: Current medicines are reviewed at length with the patient today.  Concerns regarding medicines are outlined above.   Tests Ordered: Orders Placed This Encounter  Procedures  . LONG TERM MONITOR (3-14 DAYS)   Medication Changes: Meds ordered this encounter  Medications  . nitroGLYCERIN (NITROSTAT) 0.4 MG SL tablet    Sig: Place 1 tablet (0.4 mg total) under the tongue every 5 (five) minutes x 3 doses as needed for chest pain.    Dispense:  25 tablet    Refill:  12    Disposition:  in 3 month(s) With  Dr. Jens Som  Signed, Ronney Asters, NP  01/27/2019 1:57 PM    [facility]

## 2019-01-29 ENCOUNTER — Telehealth: Payer: Self-pay | Admitting: *Deleted

## 2019-01-29 NOTE — Telephone Encounter (Signed)
[facility] [employer] to mail a 3 day ZIO XT long term holter monitor to your home.  Instructions reviewed briefly as they are included in the monitor kit.

## 2019-02-03 ENCOUNTER — Other Ambulatory Visit: Payer: 59

## 2019-02-04 ENCOUNTER — Telehealth: Payer: Self-pay | Admitting: Internal Medicine

## 2019-02-04 NOTE — Telephone Encounter (Signed)
Copied from [place] [id number]. Topic: Quick Communication - See Telephone Encounter >> Feb 04, 2019 11:27 AM Burchel, Abbi R wrote: CRM for notification. See Telephone encounter for: 02/04/19.  Caryl Pina (Dr [place], DDS)  requesting medical clearance for tooth extraction on 02/11/2019.  Please fax to: [phone].

## 2019-02-05 NOTE — Telephone Encounter (Signed)
Yes, in office as long as screens negative for COVID.

## 2019-02-05 NOTE — Telephone Encounter (Signed)
Office visit

## 2019-02-05 NOTE — Telephone Encounter (Signed)
Appointment made

## 2019-02-06 ENCOUNTER — Encounter: Payer: Self-pay | Admitting: Internal Medicine

## 2019-02-06 ENCOUNTER — Telehealth: Payer: Self-pay

## 2019-02-06 ENCOUNTER — Ambulatory Visit (INDEPENDENT_AMBULATORY_CARE_PROVIDER_SITE_OTHER): Payer: 59

## 2019-02-06 ENCOUNTER — Other Ambulatory Visit: Payer: Self-pay

## 2019-02-06 ENCOUNTER — Ambulatory Visit: Payer: 59 | Admitting: Internal Medicine

## 2019-02-06 VITALS — BP 124/84 | HR 67 | Temp 98.2°F | Wt 219.8 lb

## 2019-02-06 DIAGNOSIS — E785 Hyperlipidemia, unspecified: Secondary | ICD-10-CM

## 2019-02-06 DIAGNOSIS — I2510 Atherosclerotic heart disease of native coronary artery without angina pectoris: Secondary | ICD-10-CM

## 2019-02-06 DIAGNOSIS — R0609 Other forms of dyspnea: Secondary | ICD-10-CM | POA: Diagnosis not present

## 2019-02-06 DIAGNOSIS — R5383 Other fatigue: Secondary | ICD-10-CM

## 2019-02-06 DIAGNOSIS — I10 Essential (primary) hypertension: Secondary | ICD-10-CM

## 2019-02-06 NOTE — Telephone Encounter (Signed)
Dr. Gwenlyn Found received DOD call on 6/12 from pt PCP regarding setting up an appt in the office with Dr. Stanford Breed for pt c/o CP. No current availability in the office for Dr. Stanford Breed. Contacted pt to set up appt with an APP. Left message to call back to schedule

## 2019-02-06 NOTE — Progress Notes (Signed)
Established Patient Office Visit     CC/Reason for Visit: Continued complaints of fatigue, now with dyspnea on exertion  HPI: Raymond Norris is a 68 y.o. male who is coming in today for the above mentioned reasons. Past Medical History is significant for: Hypertension, hyperlipidemia, GERD and coronary artery disease.  He had a drug-eluting stent x2 in June 2016 and had been lost to follow-up with cardiology since 2018.  In December 2019 he was seen in the emergency department for atypical chest pain thought to be GERD and discharged home.  Since then I have seen him 4 times in the office with continued complaints of extreme fatigue, mild dyspnea on exertion, today he has an added complaint of significant dyspnea after sexual intercourse.  He did also have chest pain with multiple features that were typical for reflux and was started on a PPI back in February with some relief.  When I first saw him in February, I was concerned that his symptoms might be an anginal equivalent and requested referral to cardiology.  Since then he has seen cardiology twice, first time was asked to follow-up with me in regards to fatigue as his symptoms were not thought to be anginal in nature, second time a ZIO patch was requested.  All labs including CBC, CMP, and vitamin B12 as well as TSH that were done to work-up his fatigue have been within normal limits.  His vitamin D was a little low at 29 and he was placed on replacement, follow-up levels have not yet been drawn.  He wants to be cleared for dental surgery.   Past Medical/Surgical History: Past Medical History:  Diagnosis Date  . Coronary artery disease   . GERD 04/26/2008  . Myocardial infarction [facility](HCC)     Past Surgical History:  Procedure Laterality Date  . CARDIAC CATHETERIZATION N/A 01/31/2015   Procedure: Left Heart Cath and Coronary Angiography;  Surgeon: Tonny BollmanMichael Cooper, MD;  [facility];  Service: Cardiovascular;  Laterality:  N/A;  . PERCUTANEOUS CORONARY STENT INTERVENTION (PCI-S)  01/2015   LEFT CIRCUMFLEX  1ST & 2ND   OM BRANCHES    Social History:  reports that he has never smoked. He has never used smokeless tobacco. He reports that he does not drink alcohol or use drugs.  Allergies: No Known Allergies  Family History:  Family History  Family history unknown: Yes     Current Outpatient Medications:  .  amLODipine (NORVASC) 5 MG tablet, Take 1 tablet (5 mg total) by mouth daily. SCHEDULE OV FOR FURTHER REFILLS., Disp: 90 tablet, Rfl: 0 .  atorvastatin (LIPITOR) 80 MG tablet, TAKE 1 TABLET(80 MG) BY MOUTH DAILY, Disp: 30 tablet, Rfl: 3 .  Multiple Vitamin (MULTIVITAMIN) tablet, Take 1 tablet by mouth daily., Disp: , Rfl:  .  nitroGLYCERIN (NITROSTAT) 0.4 MG SL tablet, Place 1 tablet (0.4 mg total) under the tongue every 5 (five) minutes x 3 doses as needed for chest pain., Disp: 25 tablet, Rfl: 12 .  pantoprazole (PROTONIX) 40 MG tablet, TAKE 1 TABLET(40 MG) BY MOUTH DAILY, Disp: 90 tablet, Rfl: 1 .  Papain-Urea-Chlorophyllin (ZIOX EX), Apply topically., Disp: , Rfl:  .  aspirin 81 MG tablet, Take 81 mg by mouth daily., Disp: , Rfl:   Review of Systems:  Constitutional: Denies fever, chills, diaphoresis, appetite change. HEENT: Denies photophobia, eye pain, redness, hearing loss, ear pain, congestion, sore throat, rhinorrhea, sneezing, mouth sores, trouble swallowing, neck pain, neck stiffness and tinnitus.  Respiratory: Denies  cough, chest tightness,  and wheezing.   Cardiovascular: Denies  palpitations and leg swelling.  Gastrointestinal: Denies nausea, vomiting, abdominal pain, diarrhea, constipation, blood in stool and abdominal distention.  Genitourinary: Denies dysuria, urgency, frequency, hematuria, flank pain and difficulty urinating.  Endocrine: Denies: hot or cold intolerance, sweats, changes in hair or nails, polyuria, polydipsia. Musculoskeletal: Denies myalgias, back pain, joint  swelling, arthralgias and gait problem.  Skin: Denies pallor, rash and wound.  Neurological: Denies dizziness, seizures, syncope, weakness, light-headedness, numbness and headaches.  Hematological: Denies adenopathy. Easy bruising, personal or family bleeding history  Psychiatric/Behavioral: Denies suicidal ideation, mood changes, confusion, nervousness, sleep disturbance and agitation    Physical Exam: Vitals:   02/06/19 1110  BP: 124/84  Pulse: 67  Temp: 98.2 F (36.8 C)  TempSrc: Oral  SpO2: 98%  Weight: 219 lb 12.8 oz (99.7 kg)    Body mass index is 33.42 kg/m.   Constitutional: NAD, calm, comfortable Eyes: PERRL, lids and conjunctivae normal ENMT: Mucous membranes are moist. Respiratory: clear to auscultation bilaterally, no wheezing, no crackles. Normal respiratory effort. No accessory muscle use.  Cardiovascular: Regular rate and rhythm, no murmurs / rubs / gallops. No extremity edema. 2+ pedal pulses. No carotid bruits.  Abdomen: no tenderness, no masses palpated. No hepatosplenomegaly. Bowel sounds positive.  Musculoskeletal: no clubbing / cyanosis. No joint deformity upper and lower extremities. Good ROM, no contractures. Normal muscle tone.  Skin: no rashes, lesions, ulcers. No induration Psychiatric: Normal judgment and insight. Alert and oriented x 3. Normal mood.    Impression and Plan:  Fatigue, unspecified type DOE (dyspnea on exertion) Coronary artery disease involving native coronary artery of native heart without angina pectoris  -I continue to be concerned that his fatigue and dyspnea on exertion as well as what we have believed to be chest pain related to dyspepsia, could be an anginal equivalent.  In addition, he is now describing significant dyspnea after sexual intercourse which is new since our last visit. -EKG has been performed in the office and has been interpreted by myself: shows normal sinus rhythm at a rate of 53, normal axis, early  repolarization in leads I and aVL. -I continue to believe that this patient needs to be studied from a cardiac perspective, especially given the fact that other work-up for fatigue and dyspnea on exertion has not shown any potential etiologies. -To that effect, have discussed case with Dr. Quay Burow.  He will arrange for patient to be seen by his primary cardiologist, Dr. Stanford Breed soon. -I will not clear him for dental surgery until this has been done.  Benign essential HTN  -Well-controlled.  Dyslipidemia -Well-controlled, last LDL was 36 in March 2020.     Patient Instructions  -Nice seeing you today!  -Cardiology will call you with appointment. If you haven't heard back by Wednesday of next week, please let us know.     Lelon Frohlich, MD [facility]

## 2019-02-06 NOTE — Patient Instructions (Signed)
-  Nice seeing you today!  -Cardiology will call you with appointment. If you haven't heard back by Wednesday of next week, please let us know.

## 2019-02-09 ENCOUNTER — Telehealth: Payer: Self-pay | Admitting: Adult Health

## 2019-02-09 NOTE — Telephone Encounter (Signed)
Spoke with patient, denies any chest pain. He has appointment with Arnold Long DNP 6/17. Advised patient PCP did discuss findings with DOD Patient will take it easy and keep appointment as scheduled.

## 2019-02-09 NOTE — Telephone Encounter (Signed)
Patient called this morning to follow up on a phone call from Friday. He was told to make an appointment with an APP, as Dr. Jacalyn Lefevre schedule was full.  He was nervous by what his PCP said, and he wants to know if there is anything urgent he needs to know before his appt on Wednesday

## 2019-02-10 ENCOUNTER — Telehealth: Payer: Self-pay | Admitting: *Deleted

## 2019-02-10 NOTE — Telephone Encounter (Signed)
Copied from [place] [id number]. Topic: General - Inquiry >> Feb 09, 2019  1:56 PM Berneta Levins wrote: Reason for CRM:   Pt states that he did get intouch with cardiologist and he can only be seen Wednesday and he wants to know if Dr. Jerilee Hoh thinks that is okay.

## 2019-02-10 NOTE — Telephone Encounter (Signed)
Yes, this is fine.

## 2019-02-10 NOTE — Progress Notes (Signed)
Cardiology Office Note   Date:  02/10/2019   ID:  Raymond Norris, DOB 19-Feb-1951, MRN [id number]  PCP:  Isaac Bliss, Rayford Halsted, MD  Cardiologist:  Lubertha South  No chief complaint on file.    History of Present Illness: Raymond Norris is a 68 y.o. male who presents for ongoing assessment and management of coronary artery disease with most recent cardiac catheterization 2016 which demonstrated a 1% second marginal and a 90% first marginal stenosis with PCI with drug-eluting stent to both lesions at that time.  He was not found to have any obstructive disease in the right coronary artery or LAD.  Other history includes hypertension, hyperlipidemia, GERD, and ED.  The patient was last seen virtually by telephone, by Coletta Memos, nurse practitioner on 01/27/2019 due to complaints of fatigue and more dyspnea after having intercourse with worsening symptoms over the last 2 months.  He states that his symptoms usually last 1 hour to a half a day and then subsides.  He also had some fatigue when carrying his granddaughter and sitting her down.  He denies chest pain shortness of breath palpitations or edema.  The patient was to follow-up with PCP, limit evening caffeine, consider testosterone level with PCP.  It was recommended that he have a cardiac monitor to evaluate heart rate as he also complained of racing heart rate.  Cardiac monitor has not been downloaded at this time.  Today patient states he has not had any recurrent fatigue since we talked last on 01/27/2019.  He states the last episode of fatigue was in February.  He states that the chest pain he noticed during that time was sporadic(occuring during activity and at rest) and would go away with rest.  He has not taken any nitroglycerin.  Since the pandemic and home quarantine he has not been very active.  He states he primarily stays at his house and has limited his physical activity.  He also states that he has not had intercourse in the last  several months because he was concerned about how he felt after the last episode in February.  Since our phone visit on 01/27/2019 he has tried to have a more regular sleep schedule, better diet, but remains sedentary.  He denies chest pain today, shortness of breath, fatigue, weakness, hemoptysis, hematuria, headaches, swelling, palpitations, orthopnea and PND.  Past Medical History:  Diagnosis Date   Coronary artery disease    GERD 04/26/2008   Myocardial infarction [facility])     Past Surgical History:  Procedure Laterality Date   CARDIAC CATHETERIZATION N/A 01/31/2015   Procedure: Left Heart Cath and Coronary Angiography;  Surgeon: Sherren Mocha, MD;  Location: [place] [facility];  Service: Cardiovascular;  Laterality: N/A;   PERCUTANEOUS CORONARY STENT INTERVENTION (PCI-S)  01/2015   LEFT CIRCUMFLEX  1ST & 2ND   OM BRANCHES     Current Outpatient Medications  Medication Sig Dispense Refill   amLODipine (NORVASC) 5 MG tablet Take 1 tablet (5 mg total) by mouth daily. SCHEDULE OV FOR FURTHER REFILLS. 90 tablet 0   aspirin 81 MG tablet Take 81 mg by mouth daily.     atorvastatin (LIPITOR) 80 MG tablet TAKE 1 TABLET(80 MG) BY MOUTH DAILY 30 tablet 3   Multiple Vitamin (MULTIVITAMIN) tablet Take 1 tablet by mouth daily.     nitroGLYCERIN (NITROSTAT) 0.4 MG SL tablet Place 1 tablet (0.4 mg total) under the tongue every 5 (five) minutes x 3 doses as needed for chest pain. 25  tablet 12   pantoprazole (PROTONIX) 40 MG tablet TAKE 1 TABLET(40 MG) BY MOUTH DAILY 90 tablet 1   Papain-Urea-Chlorophyllin (ZIOX EX) Apply topically.     No current facility-administered medications for this visit.     Allergies:   Patient has no known allergies.    Social History:  The patient  reports that he has never smoked. He has never used smokeless tobacco. He reports that he does not drink alcohol or use drugs.   Family History:  The patient's Family history is unknown by patient.    ROS: All  other systems are reviewed and negative. Unless otherwise mentioned in H&P    PHYSICAL EXAM: VS:  There were no vitals taken for this visit. , BMI There is no height or weight on file to calculate BMI. GEN: Well nourished, well developed, in no acute distress HEENT: normal Neck: no JVD, carotid bruits, or masses Cardiac: RRR; no murmurs, rubs, or gallops,no edema  Respiratory:  Clear to auscultation bilaterally, normal work of breathing GI: soft, nontender, nondistended, + BS MS: no deformity or atrophy Skin: warm and dry, no rash Neuro:  Strength and sensation are intact Psych: euthymic mood, full affect   EKG:  EKG is ordered today. The ekg ordered today demonstrates sinus bradycardia 57 bpm with no acute changes.   Recent Labs: 10/30/2018: ALT 21; BUN 19; Creatinine, Ser 1.28; Hemoglobin 15.8; Platelets 377.0; Potassium 4.7; Sodium 143; TSH 2.82    Lipid Panel    Component Value Date/Time   CHOL 89 10/30/2018 0742   CHOL 95 (L) 05/10/2017 0829   TRIG 65.0 10/30/2018 0742   HDL 40.40 10/30/2018 0742   HDL 37 (L) 05/10/2017 0829   CHOLHDL 2 10/30/2018 0742   VLDL 13.0 10/30/2018 0742   LDLCALC 36 10/30/2018 0742   LDLCALC 42 05/10/2017 0829      Wt Readings from Last 3 Encounters:  02/06/19 219 lb 12.8 oz (99.7 kg)  01/27/19 200 lb (90.7 kg)  11/12/18 213 lb (96.6 kg)      Other studies Reviewed Cardiac Cath 01/31/2015 Conclusion   1. 2nd Mrg lesion, 100% stenosed. There is a 0% residual stenosis post intervention. 2. A drug-eluting stent was placed. 3. 1st Mrg lesion, 90% stenosed. There is a 0% residual stenosis post intervention. 4. A drug-eluting stent was placed.  FINAL CONCLUSIONS:  SEVERE SINGLE VESSEL CAD INVOLVING MULTIPLE BRANCHES OF THE LEFT CIRCUMFLEX WITH SUCCESSFUL PCI OF THE FIRST AND SECOND OM BRANCHES  MILD DIFFUSE NONOBSTRUCTIVE DISEASE INVOLVING THE RCA AND LAD   MODERATE DIAGONAL BRANCH STENOSIS  MILD SEGMENTAL LV CONTRACTION  ABNORMALITY  RECOMMENDATIONS:  CONTINUE AGGRESSIVE MEDICAL MANAGEMENT  DAPT WITH ASA AND BRILINTA X 12 MONTHS MINIMUM    ASSESSMENT AND PLAN:  1.  Fatigue- no new bouts since 01/2019 Waiting on 3-day Zio patch results Continue to increase physical activity as tolerated. Continue to limit caffeine in the evening Maintain new sleep hygiene patterns. Ordered Lexiscan Myoview to further evaluate atypical symptoms.  Patient states that this fatigue does not feel like his previous atypical presentation from last heart cath and stenting.  During last cardiac event he noted neck pain and chest pain radiating to his right arm. 2.  CAD/unstable angina- no chest pain today Continue aspirin Continue nitroglycerin as needed Ordered Lexiscan Myoview Reviewed chest pain and when to present to ER. 3.  Hypertension- well controlled Continue amlodipine 5 mg tablet daily 4.  GERD Continue pantoprazole  Disposition: Follow-up with Dr. Jens Somrenshaw after Eugenie BirksLexiscan  Current medicines are reviewed at length with the patient today.    Labs/ tests ordered today include: [facility] Edd FabianJesse Suzetta Timko, NP-C   02/10/2019 4:57 PM    [facility] [address] Office [phone] Fax [phone]

## 2019-02-11 ENCOUNTER — Encounter: Payer: Self-pay | Admitting: Adult Health

## 2019-02-11 ENCOUNTER — Ambulatory Visit (INDEPENDENT_AMBULATORY_CARE_PROVIDER_SITE_OTHER): Payer: 59 | Admitting: Adult Health

## 2019-02-11 ENCOUNTER — Other Ambulatory Visit: Payer: Self-pay

## 2019-02-11 VITALS — BP 130/82 | HR 88 | Temp 98.2°F | Ht 68.0 in | Wt 220.4 lb

## 2019-02-11 DIAGNOSIS — R5383 Other fatigue: Secondary | ICD-10-CM | POA: Diagnosis not present

## 2019-02-11 DIAGNOSIS — I200 Unstable angina: Secondary | ICD-10-CM

## 2019-02-11 DIAGNOSIS — I10 Essential (primary) hypertension: Secondary | ICD-10-CM | POA: Diagnosis not present

## 2019-02-11 DIAGNOSIS — E7800 Pure hypercholesterolemia, unspecified: Secondary | ICD-10-CM | POA: Diagnosis not present

## 2019-02-11 DIAGNOSIS — I214 Non-ST elevation (NSTEMI) myocardial infarction: Secondary | ICD-10-CM | POA: Diagnosis not present

## 2019-02-11 DIAGNOSIS — I213 ST elevation (STEMI) myocardial infarction of unspecified site: Secondary | ICD-10-CM

## 2019-02-11 NOTE — Patient Instructions (Addendum)
Medication Instructions:  NO CHANGES- Your physician recommends that you continue on your current medications as directed. Please refer to the Current Medication list given to you today. If you need a refill on your cardiac medications before your next appointment, please call your pharmacy.  Testing/Procedures: Your physician has requested that you have a lexiscan myoview. A cardiac stress test is a cardiological test that measures the heart's ability to respond to external stress in a controlled clinical environment. The stress response is induced by intravenous pharmacological stimulation.   Follow-Up: You will need a follow up appointment in [place], [place] or one of the following Advanced Practice Providers on your designated Care Team:  Kerin Ransom, [place] Roby Lofts, PA-C  Sande Rives, [place]     At [facility], you and your health needs are our priority.  As part of our continuing mission to provide you with exceptional heart care, we have created designated Provider Care Teams.  These Care Teams include your primary Cardiologist (physician) and Advanced Practice Providers (APPs -  Physician Assistants and Nurse Practitioners) who all work together to provide you with the care you need, when you need it.  Thank you for choosing [facility] at [facility]!!

## 2019-02-11 NOTE — Telephone Encounter (Signed)
Patient is being seen today

## 2019-02-16 ENCOUNTER — Other Ambulatory Visit: Payer: Self-pay | Admitting: Cardiology

## 2019-02-17 ENCOUNTER — Other Ambulatory Visit: Payer: Self-pay | Admitting: General Practice

## 2019-02-17 ENCOUNTER — Other Ambulatory Visit: Payer: Self-pay

## 2019-02-17 DIAGNOSIS — R5383 Other fatigue: Secondary | ICD-10-CM

## 2019-02-17 DIAGNOSIS — R0609 Other forms of dyspnea: Secondary | ICD-10-CM

## 2019-02-18 ENCOUNTER — Telehealth (HOSPITAL_COMMUNITY): Payer: Self-pay | Admitting: *Deleted

## 2019-02-18 NOTE — Telephone Encounter (Signed)
Close encounter 

## 2019-02-19 ENCOUNTER — Ambulatory Visit (HOSPITAL_COMMUNITY)
Admission: RE | Admit: 2019-02-19 | Discharge: 2019-02-19 | Disposition: A | Discharge status: Home / Self Care | Payer: Medicare Other | Source: Ambulatory Visit | Attending: Internal Medicine | Admitting: Internal Medicine

## 2019-02-19 ENCOUNTER — Other Ambulatory Visit: Payer: Self-pay

## 2019-02-19 DIAGNOSIS — I213 ST elevation (STEMI) myocardial infarction of unspecified site: Secondary | ICD-10-CM | POA: Diagnosis not present

## 2019-02-19 DIAGNOSIS — I10 Essential (primary) hypertension: Secondary | ICD-10-CM | POA: Insufficient documentation

## 2019-02-19 DIAGNOSIS — R5383 Other fatigue: Secondary | ICD-10-CM | POA: Diagnosis not present

## 2019-02-19 DIAGNOSIS — R079 Chest pain, unspecified: Secondary | ICD-10-CM | POA: Insufficient documentation

## 2019-02-19 DIAGNOSIS — I2510 Atherosclerotic heart disease of native coronary artery without angina pectoris: Secondary | ICD-10-CM | POA: Insufficient documentation

## 2019-02-19 DIAGNOSIS — R0609 Other forms of dyspnea: Secondary | ICD-10-CM | POA: Diagnosis not present

## 2019-02-19 DIAGNOSIS — R9439 Abnormal result of other cardiovascular function study: Secondary | ICD-10-CM | POA: Insufficient documentation

## 2019-02-19 DIAGNOSIS — K219 Gastro-esophageal reflux disease without esophagitis: Secondary | ICD-10-CM | POA: Diagnosis not present

## 2019-02-19 LAB — MYOCARDIAL PERFUSION IMAGING
LV dias vol: 141 mL (ref 62–150)
LV sys vol: 76 mL
Peak HR: 106 {beats}/min
Rest HR: 55 {beats}/min
SDS: 0
SRS: 2
SSS: 2
TID: 1.13

## 2019-02-19 MED ORDER — REGADENOSON 0.4 MG/5ML IV SOLN
0.4000 mg | Freq: Once | INTRAVENOUS | Status: AC
  Administered 2019-02-19: 0.4 mg via INTRAVENOUS

## 2019-02-19 MED ORDER — TECHNETIUM TC 99M TETROFOSMIN IV KIT
31.9000 | PACK | Freq: Once | INTRAVENOUS | Status: AC | PRN
  Administered 2019-02-19: 31.9 via INTRAVENOUS
  Filled 2019-02-19: qty 32

## 2019-02-19 MED ORDER — TECHNETIUM TC 99M TETROFOSMIN IV KIT
10.3000 | PACK | Freq: Once | INTRAVENOUS | Status: AC | PRN
  Administered 2019-02-19: 10.3 via INTRAVENOUS
  Filled 2019-02-19: qty 11

## 2019-02-28 ENCOUNTER — Emergency Department (HOSPITAL_COMMUNITY)
Admission: EM | Admit: 2019-02-28 | Discharge: 2019-02-28 | Disposition: A | Discharge status: Home / Self Care | Payer: 59 | Attending: Emergency Medicine | Admitting: Emergency Medicine

## 2019-02-28 ENCOUNTER — Emergency Department (HOSPITAL_COMMUNITY): Payer: 59

## 2019-02-28 ENCOUNTER — Encounter (HOSPITAL_COMMUNITY): Payer: Self-pay | Admitting: Emergency Medicine

## 2019-02-28 ENCOUNTER — Other Ambulatory Visit: Payer: Self-pay

## 2019-02-28 DIAGNOSIS — Y9289 Other specified places as the place of occurrence of the external cause: Secondary | ICD-10-CM | POA: Insufficient documentation

## 2019-02-28 DIAGNOSIS — W19XXXA Unspecified fall, initial encounter: Secondary | ICD-10-CM

## 2019-02-28 DIAGNOSIS — K219 Gastro-esophageal reflux disease without esophagitis: Secondary | ICD-10-CM | POA: Diagnosis not present

## 2019-02-28 DIAGNOSIS — Y998 Other external cause status: Secondary | ICD-10-CM | POA: Diagnosis not present

## 2019-02-28 DIAGNOSIS — W1789XA Other fall from one level to another, initial encounter: Secondary | ICD-10-CM | POA: Insufficient documentation

## 2019-02-28 DIAGNOSIS — I2510 Atherosclerotic heart disease of native coronary artery without angina pectoris: Secondary | ICD-10-CM | POA: Diagnosis not present

## 2019-02-28 DIAGNOSIS — Z79899 Other long term (current) drug therapy: Secondary | ICD-10-CM | POA: Diagnosis not present

## 2019-02-28 DIAGNOSIS — S20212A Contusion of left front wall of thorax, initial encounter: Secondary | ICD-10-CM | POA: Insufficient documentation

## 2019-02-28 DIAGNOSIS — I252 Old myocardial infarction: Secondary | ICD-10-CM | POA: Diagnosis not present

## 2019-02-28 DIAGNOSIS — Y9389 Activity, other specified: Secondary | ICD-10-CM | POA: Diagnosis not present

## 2019-02-28 DIAGNOSIS — I10 Essential (primary) hypertension: Secondary | ICD-10-CM | POA: Diagnosis not present

## 2019-02-28 DIAGNOSIS — S299XXA Unspecified injury of thorax, initial encounter: Secondary | ICD-10-CM | POA: Diagnosis present

## 2019-02-28 MED ORDER — LIDOCAINE 5 % EX PTCH: 1.0000 | MEDICATED_PATCH | Freq: Once | CUTANEOUS | Status: DC

## 2019-02-28 MED ORDER — ACETAMINOPHEN 500 MG PO TABS
1000.0000 mg | ORAL_TABLET | Freq: Once | ORAL | Status: AC
  Administered 2019-02-28: 1000 mg via ORAL
  Filled 2019-02-28: qty 2

## 2019-02-28 MED ORDER — DICLOFENAC SODIUM 1 % TD GEL
4.0000 g | Freq: Once | TRANSDERMAL | Status: DC
  Administered 2019-02-28: 4 g via TOPICAL
  Filled 2019-02-28: qty 100

## 2019-02-28 NOTE — ED Provider Notes (Signed)
[facility] Provider Note   CSN: [id number] Arrival date & time: 02/28/19  1742     History   Chief Complaint Chief Complaint  Patient presents with   Fall    HPI Raymond Norris is a 68 y.o. male.     HPI  Patient is a 68 year old male with past medical history of coronary artery disease, GERD, MI, presenting for fall to left ribs 24 hours ago.  Patient reports that his chair leg got caught on the lip of a step, and it tipped over.  He fell from a seated position.  He did not hit his head.  Patient does not take blood thinning medications.  He reports he landed onto the left side and also caught himself with his left wrist.  He reports he has had some pain with twisting his body but otherwise denies pleuritic chest pain, splinting with breathing, or difficulty with range of motion of the left wrist.  He reports that his son wanted him evaluated for this fall.  Past Medical History:  Diagnosis Date   Coronary artery disease    GERD 04/26/2008   Myocardial infarction [facility](HCC)     Patient Active Problem List   Diagnosis Date Noted   Bruit 08/04/2015   Dyslipidemia 03/02/2015   NSTEMI (non-ST elevated myocardial infarction) (HCC) 01/31/2015   Unstable angina (HCC) 01/31/2015   Benign essential HTN 01/31/2015   CAD (coronary artery disease) 01/31/2015   GERD 04/26/2008    Past Surgical History:  Procedure Laterality Date   CARDIAC CATHETERIZATION N/A 01/31/2015   Procedure: Left Heart Cath and Coronary Angiography;  Surgeon: Tonny BollmanMichael Cooper, MD;  [facility];  Service: Cardiovascular;  Laterality: N/A;   PERCUTANEOUS CORONARY STENT INTERVENTION (PCI-S)  01/2015   LEFT CIRCUMFLEX  1ST & 2ND   OM BRANCHES        Home Medications    Prior to Admission medications   Medication Sig Start Date End Date Taking? Authorizing Provider  amLODipine (NORVASC) 5 MG tablet TAKE 1 TABLET BY MOUTH EVERY DAY 02/16/19    Lewayne Buntingrenshaw, Brian S, MD  aspirin 81 MG tablet Take 81 mg by mouth daily.    [provider]  atorvastatin (LIPITOR) 80 MG tablet TAKE 1 TABLET(80 MG) BY MOUTH DAILY 12/02/18   Jodelle Redhristopher, Bridgette, MD  Multiple Vitamin (MULTIVITAMIN) tablet Take 1 tablet by mouth daily.    [provider]  nitroGLYCERIN (NITROSTAT) 0.4 MG SL tablet Place 1 tablet (0.4 mg total) under the tongue every 5 (five) minutes x 3 doses as needed for chest pain. 01/27/19   Ronney Astersleaver, Jesse M, NP  pantoprazole (PROTONIX) 40 MG tablet TAKE 1 TABLET(40 MG) BY MOUTH DAILY 01/20/19   Philip AspenHernandez Acosta, Limmie PatriciaEstela Y, MD  Papain-Urea-Chlorophyllin (ZIOX EX) Apply topically.    [provider]    Family History Family History  Family history unknown: Yes    Social History Social History   Tobacco Use   Smoking status: Never Smoker   Smokeless tobacco: Never Used  Substance Use Topics   Alcohol use: No   Drug use: No     Allergies   Patient has no known allergies.   Review of Systems Review of Systems  Musculoskeletal: Positive for arthralgias and myalgias.  Skin: Negative for wound.  Neurological: Negative for weakness and numbness.     Physical Exam Updated Vital Signs BP (!) 141/88    Pulse 61    Temp 98.2 F (36.8 C) (  Oral)    Resp 16    SpO2 97%   Physical Exam Vitals signs and nursing note reviewed.  Constitutional:      General: He is not in acute distress.    Appearance: He is well-developed. He is not diaphoretic.     Comments: Sitting comfortably in bed.  HENT:     Head: Normocephalic and atraumatic.  Eyes:     General:        Right eye: No discharge.        Left eye: No discharge.     Conjunctiva/sclera: Conjunctivae normal.     Comments: EOMs normal to gross examination.  Neck:     Musculoskeletal: Normal range of motion.  Cardiovascular:     Rate and Rhythm: Normal rate and regular rhythm.     Comments: Intact, 2+ left radial pulse. Pulmonary:     Effort:  Pulmonary effort is normal.     Breath sounds: Normal breath sounds. No wheezing or rales.     Comments: Patient has mild tenderness to palpation over the left mid rib spaces at anterior axillary line.  No crepitus.  No step-off.  No point tenderness.  No ecchymosis. Abdominal:     General: There is no distension.  Musculoskeletal: Normal range of motion.        General: No swelling or tenderness.     Comments: Patient has no swelling of the left wrist or distal radius tenderness.  No ecchymosis.  He has full range of motion of the left wrist.  Skin:    General: Skin is warm and dry.  Neurological:     Mental Status: He is alert.     Comments: Cranial nerves intact to gross observation. Patient moves extremities without difficulty.  Psychiatric:        Behavior: Behavior normal.        Thought Content: Thought content normal.        Judgment: Judgment normal.      ED Treatments / Results  Labs (all labs ordered are listed, but only abnormal results are displayed) Labs Reviewed - No data to display  EKG None  Radiology Dg Ribs Unilateral W/chest Left  Result Date: 02/28/2019 CLINICAL DATA:  Status post fall with left rib pain. EXAM: LEFT RIBS AND CHEST - 3+ VIEW COMPARISON:  Chest x-ray August 01, 2018. FINDINGS: No fracture or dislocation are seen involving the ribs. There is no evidence of pneumothorax or pleural effusion. Both lungs are clear. Heart size and mediastinal contours are stable. IMPRESSION: No acute fracture or dislocation left ribs. Electronically Signed   By: Sherian ReinWei-Chen  Lin M.D.   On: 02/28/2019 19:25   Dg Wrist Complete Left  Result Date: 02/28/2019 CLINICAL DATA:  Status post fall with left wrist pain. EXAM: LEFT WRIST - COMPLETE 3+ VIEW COMPARISON:  None. FINDINGS: There is no evidence of fracture or dislocation. Soft tissues are unremarkable. IMPRESSION: No acute fracture or dislocation noted. Electronically Signed   By: Sherian ReinWei-Chen  Lin M.D.   On: 02/28/2019 19:26     Procedures Procedures (including critical care time)  Medications Ordered in ED Medications  diclofenac sodium (VOLTAREN) 1 % transdermal gel 4 g (has no administration in time range)  acetaminophen (TYLENOL) tablet 1,000 mg (1,000 mg Oral Given 02/28/19 1851)     Initial Impression / Assessment and Plan / ED Course  I have reviewed the triage vital signs and the nursing notes.  Pertinent labs & imaging results that were available during my care  of the patient were reviewed by me and considered in my medical decision making (see chart for details).        This is a well-appearing 68 year old male with past medical history of NSTEMI, hypertension presenting for fall onto his left ribs.  He does not take anticoagulation or antiplatelets other than aspirin.  He is not having significant tenderness on exam.  He is breathing comfortably.  Normal lung sounds.  Radiograph of left ribs and chest obtained without any acute abnormalities, reviewed by me.  No indication for CT today. Left wrist radiograph without acute abnormalities.  Patient has been applying icy hot.  Will have patient continue this therapy.  Also instructed patient on the importance of mobilization to prevent atelectasis.  He is given return precautions for any worsening pain or difficulty breathing.  Patient is in understanding and agrees with the plan of care.  Final Clinical Impressions(s) / ED Diagnoses   Final diagnoses:  Fall, initial encounter  Contusion of rib on left side, initial encounter    ED Discharge Orders    None       Tamala Julian 02/28/19 Kohler, [place], DO 03/01/19 [phone]

## 2019-02-28 NOTE — Discharge Instructions (Addendum)
Your imaging was very reassuring today.  We did not see any signs of rib fracture.  Your left wrist did not have any fracture in it as well.  Please continue to use icy hot as you have been at home.  You may also take Tylenol, 650 mg every 6 hours as needed for pain.  Do not exceed 4000 mg in 1 day.  I recommend staying as active as possible.  This will encourage you to take deep breaths and increase your pulmonary tolerance.  Please come back to the ER if you are having severe pain that is causing trouble breathing.  Thank you for allowing [place] to participate in your care today.

## 2019-02-28 NOTE — ED Triage Notes (Signed)
Pt reports that he fell out of a chair yesterday. C/o left side rib pain. Denies chest pain or shortness of breath.

## 2019-02-28 NOTE — ED Notes (Signed)
Patient transported to X-ray 

## 2019-03-02 ENCOUNTER — Encounter: Payer: Self-pay | Admitting: Family Medicine

## 2019-03-02 ENCOUNTER — Ambulatory Visit (INDEPENDENT_AMBULATORY_CARE_PROVIDER_SITE_OTHER): Payer: 59 | Admitting: Family Medicine

## 2019-03-02 ENCOUNTER — Other Ambulatory Visit: Payer: Self-pay

## 2019-03-02 DIAGNOSIS — S20212D Contusion of left front wall of thorax, subsequent encounter: Secondary | ICD-10-CM | POA: Diagnosis not present

## 2019-03-02 DIAGNOSIS — I200 Unstable angina: Secondary | ICD-10-CM

## 2019-03-02 NOTE — Progress Notes (Signed)
   Subjective:    Patient ID: Raymond Norris, male    DOB: 06-19-1951, 68 y.o.   MRN: [id number]  HPI Virtual Visit via Telephone Note  I connected with the patient on 03/02/19 at  3:00 PM EDT by telephone and verified that I am speaking with the correct person using two identifiers.We attempted to connect virtually but we had technical difficulties with the audio and video.     I discussed the limitations, risks, security and privacy concerns of performing an evaluation and management service by telephone and the availability of in person appointments. I also discussed with the patient that there may be a patient responsible charge related to this service. The patient expressed understanding and agreed to proceed.  Location patient: home Location provider: work or home office Participants present for the call: patient, provider Patient did not have a visit in the prior 7 days to address this/these issue(s).   History of Present Illness: Here to follow up an ER visit on 02-28-19 for injuries to the left ribs and left wrist. That day he was sitting on a chair on a deck, when one of the chair legs slipped over the edge of the deck, causing him to fall out of the chair. He landed on his left side. At the ER his exam was normal except for tenderness over the left ribs. Xrays of the left ribs and left wrist showed no fractures. He was sent home to rest and they told him to take Tylenol for pain. Since then he has slowly improved. The wrist feels fine, but he still has some pain in the left ribs. It hurts to twist his body or to cough, but he denies any SOB.    Observations/Objective: Patient sounds cheerful and well on the phone. I do not appreciate any SOB. Speech and thought processing are grossly intact. Patient reported vitals:  Assessment and Plan: Left rib contusions from a fall. He may use Ibuprofen for pain. This should resolve over the next few weeks. He will rest and avoid any heavy  lifting. Recheck prn.  Alysia Penna, MD  Follow Up Instructions:     [phone] 5-10 [phone] 11-20 [phone] 21-30 I did not refer this patient for an OV in the next 24 hours for this/these issue(s).  I discussed the assessment and treatment plan with the patient. The patient was provided an opportunity to ask questions and all were answered. The patient agreed with the plan and demonstrated an understanding of the instructions.   The patient was advised to call back or seek an in-person evaluation if the symptoms worsen or if the condition fails to improve as anticipated.  I provided 19 minutes of non-face-to-face time during this encounter.   Alysia Penna, MD    Review of Systems     Objective:   Physical Exam        Assessment & Plan:

## 2019-03-24 NOTE — Progress Notes (Deleted)
HPI: FU CAD. Patient had cardiac catheterization June 2016. There was a 100% second marginal and a 90% first marginal. He had drug-eluting stents to both lesions. There was no obstructive disease in the right coronary artery or LAD. LV function was normal. Abdominal ultrasound 12/16 showed no aneurysm.  Nuclear study June 2020 showed ejection fraction 46%.  There was inferior infarct versus attenuation.  Monitor June 2020 showed sinus with occasional PAC, PVC and brief PAT.  Since last seen,   Current Outpatient Medications  Medication Sig Dispense Refill  . amLODipine (NORVASC) 5 MG tablet TAKE 1 TABLET BY MOUTH EVERY DAY 90 tablet 3  . aspirin 81 MG tablet Take 81 mg by mouth daily.    Marland Kitchen atorvastatin (LIPITOR) 80 MG tablet TAKE 1 TABLET(80 MG) BY MOUTH DAILY 30 tablet 3  . Multiple Vitamin (MULTIVITAMIN) tablet Take 1 tablet by mouth daily.    . nitroGLYCERIN (NITROSTAT) 0.4 MG SL tablet Place 1 tablet (0.4 mg total) under the tongue every 5 (five) minutes x 3 doses as needed for chest pain. 25 tablet 12  . pantoprazole (PROTONIX) 40 MG tablet TAKE 1 TABLET(40 MG) BY MOUTH DAILY 90 tablet 1  . Papain-Urea-Chlorophyllin (ZIOX EX) Apply topically.     No current facility-administered medications for this visit.      Past Medical History:  Diagnosis Date  . Coronary artery disease   . GERD 04/26/2008  . Myocardial infarction [facility])     Past Surgical History:  Procedure Laterality Date  . CARDIAC CATHETERIZATION N/A 01/31/2015   Procedure: Left Heart Cath and Coronary Angiography;  Surgeon: Sherren Mocha, MD;  Location: [place] [facility];  Service: Cardiovascular;  Laterality: N/A;  . PERCUTANEOUS CORONARY STENT INTERVENTION (PCI-S)  01/2015   LEFT CIRCUMFLEX  1ST & 2ND   OM BRANCHES    Social History   Socioeconomic History  . Marital status: Married    Spouse name: Not on file  . Number of children: Not on file  . Years of education: Not on file  . Highest education  level: Not on file  Occupational History  . Not on file  Social Needs  . Financial resource strain: Not on file  . Food insecurity    Worry: Not on file    Inability: Not on file  . Transportation needs    Medical: Not on file    Non-medical: Not on file  Tobacco Use  . Smoking status: Never Smoker  . Smokeless tobacco: Never Used  Substance and Sexual Activity  . Alcohol use: No  . Drug use: No  . Sexual activity: Not on file  Lifestyle  . Physical activity    Days per week: Not on file    Minutes per session: Not on file  . Stress: Not on file  Relationships  . Social Herbalist on phone: Not on file    Gets together: Not on file    Attends religious service: Not on file    Active member of club or organization: Not on file    Attends meetings of clubs or organizations: Not on file    Relationship status: Not on file  . Intimate partner violence    Fear of current or ex partner: Not on file    Emotionally abused: Not on file    Physically abused: Not on file    Forced sexual activity: Not on file  Other Topics Concern  . Not on file  Social History Narrative  . Not on file    Family History  Family history unknown: Yes    ROS: no fevers or chills, productive cough, hemoptysis, dysphasia, odynophagia, melena, hematochezia, dysuria, hematuria, rash, seizure activity, orthopnea, PND, pedal edema, claudication. Remaining systems are negative.  Physical Exam: Well-developed well-nourished in no acute distress.  Skin is warm and dry.  HEENT is normal.  Neck is supple.  Chest is clear to auscultation with normal expansion.  Cardiovascular exam is regular rate and rhythm.  Abdominal exam nontender or distended. No masses palpated. Extremities show no edema. neuro grossly intact  ECG- personally reviewed  A/P  1 coronary artery disease-patient denies chest pain.  Recent nuclear study showed no ischemia.  Continue medical therapy with aspirin and  statin.  2 hypertension-patient's blood pressure is controlled.  Continue present medications and follow.  3 hyperlipidemia-continue statin.  Olga MillersBrian Audre Cenci, MD

## 2019-03-26 ENCOUNTER — Ambulatory Visit: Payer: 59 | Admitting: Cardiology

## 2019-04-02 ENCOUNTER — Other Ambulatory Visit: Payer: Self-pay | Admitting: Cardiology

## 2019-04-07 ENCOUNTER — Other Ambulatory Visit: Payer: Self-pay

## 2019-04-07 ENCOUNTER — Telehealth: Payer: 59 | Admitting: Internal Medicine

## 2019-04-07 DIAGNOSIS — Z0289 Encounter for other administrative examinations: Secondary | ICD-10-CM

## 2019-04-09 ENCOUNTER — Other Ambulatory Visit: Payer: Self-pay

## 2019-04-09 ENCOUNTER — Telehealth (INDEPENDENT_AMBULATORY_CARE_PROVIDER_SITE_OTHER): Payer: 59 | Admitting: Internal Medicine

## 2019-04-09 ENCOUNTER — Encounter: Payer: Self-pay | Admitting: Internal Medicine

## 2019-04-09 DIAGNOSIS — J069 Acute upper respiratory infection, unspecified: Secondary | ICD-10-CM | POA: Diagnosis not present

## 2019-04-09 DIAGNOSIS — J988 Other specified respiratory disorders: Secondary | ICD-10-CM

## 2019-04-09 DIAGNOSIS — U071 COVID-19: Secondary | ICD-10-CM

## 2019-04-09 DIAGNOSIS — Z20828 Contact with and (suspected) exposure to other viral communicable diseases: Secondary | ICD-10-CM

## 2019-04-09 NOTE — Patient Instructions (Signed)
Claritin daily Mucinex twice daily Cepacol throat lozenges as needed

## 2019-04-09 NOTE — Progress Notes (Signed)
Virtual Visit via Telephone Note  I connected with Raymond Norris on 04/09/19 at  1:30 PM EDT by telephone and verified that I am speaking with the correct person using two identifiers.   I discussed the limitations, risks, security and privacy concerns of performing an evaluation and management service by telephone and the availability of in person appointments. I also discussed with the patient that there may be a patient responsible charge related to this service. The patient expressed understanding and agreed to proceed.  Location patient: home Location provider: work office Participants present for the call: patient, provider Patient did not have a visit in the prior 7 days to address this/these issue(s).   History of Present Illness:  Acute visit for sore throat, nasal drainage and mild cough. Also a very mild HA. This has been going on for 18 hours. No sick contacts, no recent travel. Only leaves house for essentials, always with mask on. No fever, SOB, DOE, myalgias.   Observations/Objective: Patient sounds cheerful and well on the phone. I do not appreciate any increased work of breathing. Speech and thought processing are grossly intact. Patient reported vitals: none reported   Current Outpatient Medications:  .  amLODipine (NORVASC) 5 MG tablet, TAKE 1 TABLET BY MOUTH EVERY DAY, Disp: 90 tablet, Rfl: 3 .  aspirin 81 MG tablet, Take 81 mg by mouth daily., Disp: , Rfl:  .  atorvastatin (LIPITOR) 80 MG tablet, TAKE 1 TABLET(80 MG) BY MOUTH DAILY, Disp: 30 tablet, Rfl: 3 .  Multiple Vitamin (MULTIVITAMIN) tablet, Take 1 tablet by mouth daily., Disp: , Rfl:  .  nitroGLYCERIN (NITROSTAT) 0.4 MG SL tablet, Place 1 tablet (0.4 mg total) under the tongue every 5 (five) minutes x 3 doses as needed for chest pain., Disp: 25 tablet, Rfl: 12 .  pantoprazole (PROTONIX) 40 MG tablet, TAKE 1 TABLET(40 MG) BY MOUTH DAILY, Disp: 90 tablet, Rfl: 1 .  Papain-Urea-Chlorophyllin (ZIOX EX),  Apply topically., Disp: , Rfl:   Review of Systems:  Constitutional: Denies fever, chills, diaphoresis, appetite change and fatigue.  HEENT: Denies photophobia, eye pain, redness, hearing loss, ear pain, congestion, sneezing, mouth sores, trouble swallowing, neck pain, neck stiffness and tinnitus.   Respiratory: Denies SOB, DOE, cough, chest tightness,  and wheezing.   Cardiovascular: Denies chest pain, palpitations and leg swelling.  Gastrointestinal: Denies nausea, vomiting, abdominal pain, diarrhea, constipation, blood in stool and abdominal distention.  Genitourinary: Denies dysuria, urgency, frequency, hematuria, flank pain and difficulty urinating.  Endocrine: Denies: hot or cold intolerance, sweats, changes in hair or nails, polyuria, polydipsia. Musculoskeletal: Denies myalgias, back pain, joint swelling, arthralgias and gait problem.  Skin: Denies pallor, rash and wound.  Neurological: Denies dizziness, seizures, syncope, weakness, light-headedness, numbness and headaches.  Hematological: Denies adenopathy. Easy bruising, personal or family bleeding history  Psychiatric/Behavioral: Denies suicidal ideation, mood changes, confusion, nervousness, sleep disturbance and agitation   Assessment and Plan:  Upper respiratory tract infection, unspecified type -Daily antihistamine, BID mucinex, PRN throat lozenges. -Have also advised COVID-19 testing, which he has agreed to. -RTC if no improvement in 7-10 days.   I discussed the assessment and treatment plan with the patient. The patient was provided an opportunity to ask questions and all were answered. The patient agreed with the plan and demonstrated an understanding of the instructions.   The patient was advised to call back or seek an in-person evaluation if the symptoms worsen or if the condition fails to improve as anticipated.  I provided  12 minutes of non-face-to-face time during this encounter.   Lelon Frohlich, MD   Primary Care at [facility]

## 2019-04-09 NOTE — Addendum Note (Signed)
Addended by: Westley Hummer B on: 04/09/2019 03:07 PM   Modules accepted: Orders

## 2019-04-17 ENCOUNTER — Telehealth: Payer: Self-pay

## 2019-04-17 NOTE — Telephone Encounter (Signed)
Spoke with patient wife. She stated that he still has a lot of drainage. It had not improved with Mucinex and Zyrtec. Cough is not productive.

## 2019-04-20 ENCOUNTER — Telehealth (INDEPENDENT_AMBULATORY_CARE_PROVIDER_SITE_OTHER): Payer: 59 | Admitting: Family Medicine

## 2019-04-20 ENCOUNTER — Encounter: Payer: Self-pay | Admitting: Family Medicine

## 2019-04-20 ENCOUNTER — Other Ambulatory Visit: Payer: Self-pay

## 2019-04-20 DIAGNOSIS — J019 Acute sinusitis, unspecified: Secondary | ICD-10-CM

## 2019-04-20 MED ORDER — AMOXICILLIN-POT CLAVULANATE 875-125 MG PO TABS: 1.0000 | ORAL_TABLET | Freq: Two times a day (BID) | ORAL | 0 refills | Status: DC

## 2019-04-20 NOTE — Progress Notes (Signed)
Virtual Visit via Video Note  I connected with the patient on 04/20/19 at  1:00 PM EDT by a video enabled telemedicine application and verified that I am speaking with the correct person using two identifiers.  Location patient: home Location provider:work or home office Persons participating in the virtual visit: patient, provider  I discussed the limitations of evaluation and management by telemedicine and the availability of in person appointments. The patient expressed understanding and agreed to proceed.   HPI: Here for one week of sinus congestion, PND,and blowing yellow mucus from the nose. No fever or cough or SOB or headache or body aches. Taking an antihistamine.    ROS: See pertinent positives and negatives per HPI.  Past Medical History:  Diagnosis Date  . Coronary artery disease   . GERD 04/26/2008  . Myocardial infarction [facility])     Past Surgical History:  Procedure Laterality Date  . CARDIAC CATHETERIZATION N/A 01/31/2015   Procedure: Left Heart Cath and Coronary Angiography;  Surgeon: Sherren Mocha, MD;  Location: [place] [facility];  Service: Cardiovascular;  Laterality: N/A;  . PERCUTANEOUS CORONARY STENT INTERVENTION (PCI-S)  01/2015   LEFT CIRCUMFLEX  1ST & 2ND   OM BRANCHES    Family History  Family history unknown: Yes     Current Outpatient Medications:  .  amLODipine (NORVASC) 5 MG tablet, TAKE 1 TABLET BY MOUTH EVERY DAY, Disp: 90 tablet, Rfl: 3 .  amoxicillin-clavulanate (AUGMENTIN) 875-125 MG tablet, Take 1 tablet by mouth 2 (two) times daily., Disp: 20 tablet, Rfl: 0 .  aspirin 81 MG tablet, Take 81 mg by mouth daily., Disp: , Rfl:  .  atorvastatin (LIPITOR) 80 MG tablet, TAKE 1 TABLET(80 MG) BY MOUTH DAILY, Disp: 30 tablet, Rfl: 3 .  Multiple Vitamin (MULTIVITAMIN) tablet, Take 1 tablet by mouth daily., Disp: , Rfl:  .  nitroGLYCERIN (NITROSTAT) 0.4 MG SL tablet, Place 1 tablet (0.4 mg total) under the tongue every 5 (five) minutes x 3 doses as  needed for chest pain., Disp: 25 tablet, Rfl: 12 .  pantoprazole (PROTONIX) 40 MG tablet, TAKE 1 TABLET(40 MG) BY MOUTH DAILY, Disp: 90 tablet, Rfl: 1 .  Papain-Urea-Chlorophyllin (ZIOX EX), Apply topically., Disp: , Rfl:   EXAM:  VITALS per patient if applicable:  GENERAL: alert, oriented, appears well and in no acute distress  HEENT: atraumatic, conjunttiva clear, no obvious abnormalities on inspection of external nose and ears  NECK: normal movements of the head and neck  LUNGS: on inspection no signs of respiratory distress, breathing rate appears normal, no obvious gross SOB, gasping or wheezing  CV: no obvious cyanosis  MS: moves all visible extremities without noticeable abnormality  PSYCH/NEURO: pleasant and cooperative, no obvious depression or anxiety, speech and thought processing grossly intact  ASSESSMENT AND PLAN: Sinusitis, treat with Augmentin. Alysia Penna, MD  Discussed the following assessment and plan:  No diagnosis found.     I discussed the assessment and treatment plan with the patient. The patient was provided an opportunity to ask questions and all were answered. The patient agreed with the plan and demonstrated an understanding of the instructions.   The patient was advised to call back or seek an in-person evaluation if the symptoms worsen or if the condition fails to improve as anticipated.

## 2019-04-21 NOTE — Telephone Encounter (Signed)
Spoke with patient and he is feeling much better.

## 2019-05-05 ENCOUNTER — Ambulatory Visit (INDEPENDENT_AMBULATORY_CARE_PROVIDER_SITE_OTHER): Payer: 59 | Admitting: Internal Medicine

## 2019-05-05 ENCOUNTER — Encounter: Payer: Self-pay | Admitting: Internal Medicine

## 2019-05-05 ENCOUNTER — Other Ambulatory Visit: Payer: Self-pay

## 2019-05-05 VITALS — BP 124/80 | HR 72 | Temp 97.3°F | Wt 223.6 lb

## 2019-05-05 DIAGNOSIS — K219 Gastro-esophageal reflux disease without esophagitis: Secondary | ICD-10-CM

## 2019-05-05 DIAGNOSIS — I2510 Atherosclerotic heart disease of native coronary artery without angina pectoris: Secondary | ICD-10-CM | POA: Diagnosis not present

## 2019-05-05 DIAGNOSIS — I10 Essential (primary) hypertension: Secondary | ICD-10-CM | POA: Diagnosis not present

## 2019-05-05 DIAGNOSIS — E669 Obesity, unspecified: Secondary | ICD-10-CM

## 2019-05-05 DIAGNOSIS — E785 Hyperlipidemia, unspecified: Secondary | ICD-10-CM | POA: Diagnosis not present

## 2019-05-05 NOTE — Patient Instructions (Signed)
-  Nice seeing you today!!  -See you back in 6 months. Call us sooner if needed.

## 2019-05-05 NOTE — Progress Notes (Signed)
Established Patient Office Visit     CC/Reason for Visit: 46-month follow-up of chronic conditions  HPI: Raymond Norris is a 68 y.o. male who is coming in today for the above mentioned reasons. Past Medical History is significant for: Well-controlled hypertension, well-controlled hyperlipidemia, GERD controlled on PPI as well as a history of coronary artery disease with a recent stress test that was interpreted as negative.  Since I last saw him he had an bout of acute sinusitis and was treated with some antibiotics, he also fell off his deck and suffered some rib contusions and some hand pain, x-rays were negative, this has also resolved.  He has no acute complaints today.   Past Medical/Surgical History: Past Medical History:  Diagnosis Date  . Coronary artery disease   . GERD 04/26/2008  . Myocardial infarction [facility])     Past Surgical History:  Procedure Laterality Date  . CARDIAC CATHETERIZATION N/A 01/31/2015   Procedure: Left Heart Cath and Coronary Angiography;  Surgeon: Sherren Mocha, MD;  Location: [place] [facility];  Service: Cardiovascular;  Laterality: N/A;  . PERCUTANEOUS CORONARY STENT INTERVENTION (PCI-S)  01/2015   LEFT CIRCUMFLEX  1ST & 2ND   OM BRANCHES    Social History:  reports that he has never smoked. He has never used smokeless tobacco. He reports that he does not drink alcohol or use drugs.  Allergies: No Known Allergies  Family History:  Family History  Family history unknown: Yes     Current Outpatient Medications:  .  amLODipine (NORVASC) 5 MG tablet, TAKE 1 TABLET BY MOUTH EVERY DAY, Disp: 90 tablet, Rfl: 3 .  aspirin 81 MG tablet, Take 81 mg by mouth daily., Disp: , Rfl:  .  atorvastatin (LIPITOR) 80 MG tablet, TAKE 1 TABLET(80 MG) BY MOUTH DAILY, Disp: 30 tablet, Rfl: 3 .  Multiple Vitamin (MULTIVITAMIN) tablet, Take 1 tablet by mouth daily., Disp: , Rfl:  .  nitroGLYCERIN (NITROSTAT) 0.4 MG SL tablet, Place 1 tablet (0.4 mg total) under  the tongue every 5 (five) minutes x 3 doses as needed for chest pain., Disp: 25 tablet, Rfl: 12 .  pantoprazole (PROTONIX) 40 MG tablet, TAKE 1 TABLET(40 MG) BY MOUTH DAILY, Disp: 90 tablet, Rfl: 1 .  Papain-Urea-Chlorophyllin (ZIOX EX), Apply topically., Disp: , Rfl:   Review of Systems:  Constitutional: Denies fever, chills, diaphoresis, appetite change and fatigue.  HEENT: Denies photophobia, eye pain, redness, hearing loss, ear pain, congestion, sore throat, rhinorrhea, sneezing, mouth sores, trouble swallowing, neck pain, neck stiffness and tinnitus.   Respiratory: Denies SOB, DOE, cough, chest tightness,  and wheezing.   Cardiovascular: Denies chest pain, palpitations and leg swelling.  Gastrointestinal: Denies nausea, vomiting, abdominal pain, diarrhea, constipation, blood in stool and abdominal distention.  Genitourinary: Denies dysuria, urgency, frequency, hematuria, flank pain and difficulty urinating.  Endocrine: Denies: hot or cold intolerance, sweats, changes in hair or nails, polyuria, polydipsia. Musculoskeletal: Denies myalgias, back pain, joint swelling, arthralgias and gait problem.  Skin: Denies pallor, rash and wound.  Neurological: Denies dizziness, seizures, syncope, weakness, light-headedness, numbness and headaches.  Hematological: Denies adenopathy. Easy bruising, personal or family bleeding history  Psychiatric/Behavioral: Denies suicidal ideation, mood changes, confusion, nervousness, sleep disturbance and agitation    Physical Exam: Vitals:   05/05/19 0756  BP: 124/80  Pulse: 72  Temp: (!) 97.3 F (36.3 C)  TempSrc: Temporal  SpO2: 94%  Weight: 223 lb 9.6 oz (101.4 kg)    Body mass index is 34  kg/m.   Constitutional: NAD, calm, comfortable Eyes: PERRL, lids and conjunctivae normal ENMT: Mucous membranes are moist.  Respiratory: clear to auscultation bilaterally, no wheezing, no crackles. Normal respiratory effort. No accessory muscle use.   Cardiovascular: Regular rate and rhythm, no murmurs / rubs / gallops. No extremity edema. 2+ pedal pulses. No carotid bruits.  Abdomen: no tenderness, no masses palpated. No hepatosplenomegaly. Bowel sounds positive.  Musculoskeletal: no clubbing / cyanosis. No joint deformity upper and lower extremities. Good ROM, no contractures. Normal muscle tone.  Skin: no rashes, lesions, ulcers. No induration Neurologic: Grossly intact and nonfocal  Psychiatric: Normal judgment and insight. Alert and oriented x 3. Normal mood.    Impression and Plan:  Dyslipidemia -Last LDL was 36 in March 2020. -Continue Lipitor.  Benign essential HTN -Well-controlled on current regimen.  Gastroesophageal reflux disease, esophagitis presence not specified -Continue daily PPI.  Coronary artery disease involving native coronary artery of native heart without angina pectoris -Has seen cardiology recently, with negative stress test, no changes to medications.  Obesity Body mass index is 34 kg/m. -Discussed healthy lifestyle, including increased physical activity and better food choices to promote weight loss.    Patient Instructions  -Nice seeing you today!!  -See you back in 6 months. Call us sooner if needed.     Chaya JanEstela Hernandez Acosta, MD [place] [facility]

## 2019-07-03 ENCOUNTER — Telehealth: Payer: Self-pay | Admitting: *Deleted

## 2019-07-03 NOTE — Telephone Encounter (Signed)
LVM 11/6

## 2019-07-03 NOTE — Telephone Encounter (Signed)
   [facility] Pre-operative Risk Assessment    Request for surgical clearance:  1. What type of surgery is being performed? Dental tx= cleaning, radiographs, fillings, crowns, bridges, extractions, root canal, nitrous oxide and local anesthetic with epinephrine   2. When is this surgery scheduled? TBD  3. What type of clearance is required (medical clearance vs. Pharmacy clearance to hold med vs. Both)? medical  4. Are there any medications that need to be held prior to surgery and how long? none  5. Practice name and name of physician performing surgery? [facility]  6. What is your office phone number [phone]   7.   What is your office fax number [phone]  8.   Anesthesia type (None, local, MAC, general) ? above   Fredia Beets 07/03/2019, 7:19 AM  _________________________________________________________________   (provider comments below)

## 2019-07-07 NOTE — Telephone Encounter (Signed)
   Primary Cardiologist: Kirk Ruths, MD  Chart reviewed as part of pre-operative protocol coverage. Patient was contacted 07/07/2019 in reference to pre-operative risk assessment for pending surgery as outlined below.  Raymond Norris was last seen on 02/11/2019 by Jory Sims, NP.  Since that day, Raymond Norris has done well from a cardiac standpoint without any chest pain or SOB. He is planning for a single tooth removal. Simple dental extractions are considered low risk procedures per guidelines and generally do not require any specific cardiac clearance. It is also generally accepted that for simple extractions and dental cleanings, there is no need to interrupt blood thinner therapy.   SBE prophylaxis is not required for the patient.  I will route this recommendation to the requesting party via Epic fax function and remove from pre-op pool.  I will route this recommendation to the requesting party via Epic fax function and remove from pre-op pool.  Please call with questions.  Abigail Butts, PA-C 07/07/2019, 8:42 AM

## 2019-07-28 ENCOUNTER — Other Ambulatory Visit: Payer: Self-pay | Admitting: Cardiology

## 2019-07-28 ENCOUNTER — Other Ambulatory Visit: Payer: Self-pay | Admitting: Internal Medicine

## 2019-07-28 DIAGNOSIS — K219 Gastro-esophageal reflux disease without esophagitis: Secondary | ICD-10-CM

## 2019-07-28 MED ORDER — PANTOPRAZOLE SODIUM 40 MG PO TBEC: DELAYED_RELEASE_TABLET | ORAL | 1 refills | Status: DC

## 2019-07-28 NOTE — Telephone Encounter (Signed)
Medication Refill - Medication: pantoprazole (PROTONIX) 40 MG tablet   Has the patient contacted their pharmacy? Yes.   (Agent: If no, request that the patient contact the pharmacy for the refill.) (Agent: If yes, when and what did the pharmacy advise?)  Preferred Pharmacy (with phone number or street name):  [id number] - [place], [place] [phone] (Phone) [phone] (Fax)     Agent: Please be advised that RX refills may take up to 3 business days. We ask that you follow-up with your pharmacy.

## 2019-09-22 ENCOUNTER — Ambulatory Visit: Payer: 59 | Attending: Internal Medicine

## 2019-09-22 DIAGNOSIS — Z20822 Contact with and (suspected) exposure to covid-19: Secondary | ICD-10-CM

## 2019-09-23 LAB — NOVEL CORONAVIRUS, NAA: SARS-CoV-2, NAA: NOT DETECTED

## 2019-11-03 ENCOUNTER — Ambulatory Visit: Payer: 59 | Admitting: Internal Medicine

## 2019-12-02 ENCOUNTER — Other Ambulatory Visit: Payer: Self-pay | Admitting: Cardiology

## 2019-12-02 NOTE — Telephone Encounter (Signed)
  *  STAT* If patient is at the pharmacy, call can be transferred to refill team.   1. Which medications need to be refilled? (please list name of each medication and dose if known) atorvastatin (LIPITOR) 80 MG tablet  2. Which pharmacy/location (including street and city if local pharmacy) is medication to be sent to? [id number] - [place], [place] - [address] AT [facility] [address]  3. Do they need a 30 day or 90 day supply? 30 days

## 2019-12-04 MED ORDER — ATORVASTATIN CALCIUM 80 MG PO TABS: 80.0000 mg | ORAL_TABLET | Freq: Every day | ORAL | 1 refills | Status: DC

## 2019-12-04 NOTE — Telephone Encounter (Signed)
Rx request sent to pharmacy.  

## 2019-12-09 ENCOUNTER — Telehealth: Payer: Self-pay | Admitting: Internal Medicine

## 2019-12-09 ENCOUNTER — Telehealth: Payer: Self-pay | Admitting: *Deleted

## 2019-12-09 NOTE — Telephone Encounter (Signed)
Patients wife called today to have the nurse to call the patient to talk to him about his symptoms.   The patient is feeling like his throat is closing up when he eats and he is hocking up  Something that looks like pebbles.   I advised the patients wife that he might have to make an appointment and possibly bring in whatever it is that  He is coughing up so the doctor can see what he is calling a pebble.  Please advise

## 2019-12-09 NOTE — Telephone Encounter (Signed)
Opened in error

## 2019-12-09 NOTE — Telephone Encounter (Signed)
Virtual appointment scheduled with Dr Selena Batten 12/10/19

## 2019-12-10 ENCOUNTER — Encounter: Payer: Self-pay | Admitting: Family Medicine

## 2019-12-10 ENCOUNTER — Telehealth (INDEPENDENT_AMBULATORY_CARE_PROVIDER_SITE_OTHER): Payer: 59 | Admitting: Family Medicine

## 2019-12-10 DIAGNOSIS — K219 Gastro-esophageal reflux disease without esophagitis: Secondary | ICD-10-CM | POA: Diagnosis not present

## 2019-12-10 DIAGNOSIS — Z7185 Encounter for immunization safety counseling: Secondary | ICD-10-CM

## 2019-12-10 DIAGNOSIS — J358 Other chronic diseases of tonsils and adenoids: Secondary | ICD-10-CM

## 2019-12-10 DIAGNOSIS — R09A2 Foreign body sensation, throat: Secondary | ICD-10-CM

## 2019-12-10 DIAGNOSIS — R0981 Nasal congestion: Secondary | ICD-10-CM | POA: Diagnosis not present

## 2019-12-10 DIAGNOSIS — R0989 Other specified symptoms and signs involving the circulatory and respiratory systems: Secondary | ICD-10-CM

## 2019-12-10 DIAGNOSIS — Z7189 Other specified counseling: Secondary | ICD-10-CM

## 2019-12-10 MED ORDER — FLUTICASONE PROPIONATE 50 MCG/ACT NA SUSP: NASAL | 1 refills | Status: DC

## 2019-12-10 NOTE — Progress Notes (Signed)
Virtual Visit via Telephone Note  I connected with Raymond Norris on 12/10/19 at 11:00 AM EDT by telephone and verified that I am speaking with the correct person using two identifiers.   I discussed the limitations, risks, security and privacy concerns of performing an evaluation and management service by telephone and the availability of in person appointments. I also discussed with the patient that there may be a patient responsible charge related to this service. The patient expressed understanding and agreed to proceed.  Location patient: home, [place] Location provider: work or home office Participants present for the call: patient, provider Patient did not have a visit in the prior 7 days to address this/these issue(s).   History of Present Illness:    Acute visit for throat issues: -started about three weeks ago -intermittent symptoms -symptoms include nasal congestion - clear, PND, globus sensation, once found small white foul smelling "stone" in the back of his throat he was able to get out -denies fevers, difficulty breathing, loss of taste or smell, difficulty swallowing, sick exposures, exposures to others without COVID precautions -Hx of GERD and reports takes his protonix daily, denies issues with heartburn, bad taste in mouth -report has been on flonase in the past at this time of the year and it really helped -has questions about the COVID19 vaccines, has not gotten them yet and has family members whom told him not to get them  Observations/Objective: Patient sounds cheerful and well on the phone. I do not appreciate any SOB. Speech and thought processing are grossly intact. Patient reported vitals:  Assessment and Plan:  Nasal congestion  Globus sensation  Tonsillith  Gastroesophageal reflux disease, unspecified whether esophagitis present  Vaccine counseling  Counseled about COVID-19 virus infection  -we discussed possible serious and likely etiologies,  options for evaluation and workup, limitations of telemedicine visit vs in person visit, treatment, treatment risks and precautions. Pt prefers to treat via telemedicine empirically rather then risking or undertaking an in person visit at this moment. Query seasonal allergies as the cause of the PND, globus sensation and likely tonsillith. Discusse dother causes and he agrees to seek evaluation with ENT if does not resolve with INS +/- antihistamine. Had lengthy discussion about COVID19 precautions and the COVID19 vaccines risks/benefits known to date per CDC. Advised follow up with PCP or me in 1-2 weeks. Patient agrees to seek prompt in person care if worsening, new symptoms arise, or if is not improving with treatment.  Follow Up Instructions:  I did not refer this patient for an OV in the next 24 hours for this/these issue(s).  I discussed the assessment and treatment plan with the patient. The patient was provided an opportunity to ask questions and all were answered. The patient agreed with the plan and demonstrated an understanding of the instructions.   The patient was advised to call back or seek an in-person evaluation if the symptoms worsen or if the condition fails to improve as anticipated.  I provided 22 minutes of non-face-to-face time during this encounter.   Terressa Koyanagi, DO

## 2019-12-10 NOTE — Patient Instructions (Signed)
-  I sent the medication(s) we discussed to your pharmacy: Meds ordered this encounter  Medications  . fluticasone (FLONASE) 50 MCG/ACT nasal spray    Sig: 2 sprays each nost qd x 1 month, then 1 spry q nost qd    Dispense:  16 g    Refill:  1   Can also try claritin or allegra once dialy.  Please let us know if you have any questions or concerns regarding this prescription.  Follow up with Dr. Ardyth Harps or Dr. Selena Batten in 1-2 weeks.  I hope you are feeling better soon! Seek care promptly if your symptoms worsen, new concerns arise or you are not improving with treatment.

## 2019-12-17 ENCOUNTER — Ambulatory Visit: Payer: 59 | Admitting: Internal Medicine

## 2019-12-24 ENCOUNTER — Ambulatory Visit: Payer: 59 | Attending: Internal Medicine

## 2019-12-24 DIAGNOSIS — Z23 Encounter for immunization: Secondary | ICD-10-CM

## 2019-12-24 NOTE — Progress Notes (Signed)
   Covid-19 Vaccination Clinic  Name:  Raymond Norris    MRN: [id number] DOB: Mar 28, 1951  12/24/2019  [name] was observed post Covid-19 immunization for 15 minutes without incident. He was provided with Vaccine Information Sheet and instruction to access the V-Safe system.   [name] was instructed to call 911 with any severe reactions post vaccine: Marland Kitchen Difficulty breathing  . Swelling of face and throat  . A fast heartbeat  . A bad rash all over body  . Dizziness and weakness   Immunizations Administered    Name Date Dose VIS Date Route   Pfizer COVID-19 Vaccine 12/24/2019  4:38 PM 0.3 mL 10/21/2018 Intramuscular   Manufacturer: [employer], [employer]   Lot: [id number]   NDC: [address]-7

## 2019-12-25 ENCOUNTER — Telehealth: Payer: Self-pay | Admitting: Cardiology

## 2019-12-25 NOTE — Telephone Encounter (Signed)
New message:     Patient calling stating that he would like to know do he really need to take his second covid shot. Please call patient back.

## 2019-12-25 NOTE — Telephone Encounter (Signed)
[id number]

## 2019-12-28 NOTE — Telephone Encounter (Signed)
Left message for the patient that it is very important to get the second vaccine if merduna or pfier was the first shot. He is to call with questions.

## 2019-12-30 ENCOUNTER — Other Ambulatory Visit: Payer: Self-pay | Admitting: Internal Medicine

## 2019-12-30 DIAGNOSIS — K219 Gastro-esophageal reflux disease without esophagitis: Secondary | ICD-10-CM

## 2020-01-06 ENCOUNTER — Ambulatory Visit: Payer: 59 | Admitting: Internal Medicine

## 2020-01-18 ENCOUNTER — Ambulatory Visit: Payer: 59

## 2020-01-27 NOTE — Progress Notes (Signed)
Virtual Visit via Video Note changed to phone visit at patient request.   This visit type was conducted due to national recommendations for restrictions regarding the COVID-19 Pandemic (e.g. social distancing) in an effort to limit this patient's exposure and mitigate transmission in our community.  Due to his co-morbid illnesses, this patient is at least at moderate risk for complications without adequate follow up.  This format is felt to be most appropriate for this patient at this time.  All issues noted in this document were discussed and addressed.  A limited physical exam was performed with this format.  Please refer to the patient's chart for his consent to telehealth for [facility].   Date:  01/29/2020   ID:  Raymond Norris, DOB [birth year], MRN [id number]  Patient Location:Home Provider Location: Home  PCP:  Isaac Bliss, Rayford Halsted, MD  Cardiologist:  Dr Stanford Breed  Evaluation Performed:  Follow-Up Visit  Chief Complaint:  FU CAD  History of Present Illness:    FU CAD. Patient had cardiac catheterization June 2016. There was a 100% second marginal and a 90% first marginal. He had drug-eluting stents to both lesions. There was no obstructive disease in the right coronary artery or LAD. LV function was normal. Abdominal ultrasound 12/16 showed no aneurysm.  Monitor June 2020 showed sinus rhythm with occasional PAC, PVC and brief PAT.  Nuclear study June 2020 showed ejection fraction 46%, inferior infarct versus attenuation and no ischemia.  Since last seen, the patient denies any dyspnea on exertion, orthopnea, PND, pedal edema, palpitations, syncope or chest pain.    The patient does not have symptoms concerning for COVID-19 infection (fever, chills, cough, or new shortness of breath).    Past Medical History:  Diagnosis Date   Coronary artery disease    GERD 04/26/2008   Myocardial infarction [facility])    Past Surgical History:  Procedure Laterality Date   CARDIAC  CATHETERIZATION N/A 01/31/2015   Procedure: Left Heart Cath and Coronary Angiography;  Surgeon: Sherren Mocha, MD;  Location: [facility];  Service: Cardiovascular;  Laterality: N/A;   PERCUTANEOUS CORONARY STENT INTERVENTION (PCI-S)  01/2015   LEFT CIRCUMFLEX  1ST & 2ND   OM BRANCHES     Current Meds  Medication Sig   amLODipine (NORVASC) 5 MG tablet TAKE 1 TABLET BY MOUTH EVERY DAY   aspirin 81 MG tablet Take 81 mg by mouth daily.   atorvastatin (LIPITOR) 80 MG tablet Take 1 tablet (80 mg total) by mouth daily. Please call our office to schedule your follow-up appointment with Dr. Harrell Gave.   fluticasone (FLONASE) 50 MCG/ACT nasal spray 2 sprays each nost qd x 1 month, then 1 spry q nost qd   Multiple Vitamin (MULTIVITAMIN) tablet Take 1 tablet by mouth daily.   nitroGLYCERIN (NITROSTAT) 0.4 MG SL tablet Place 1 tablet (0.4 mg total) under the tongue every 5 (five) minutes x 3 doses as needed for chest pain.   pantoprazole (PROTONIX) 40 MG tablet TAKE 1 TABLET(40 MG) BY MOUTH DAILY     Allergies:   Patient has no known allergies.   Social History   Tobacco Use   Smoking status: Never Smoker   Smokeless tobacco: Never Used  Substance Use Topics   Alcohol use: No   Drug use: No     Family Hx: The patient's Family history is unknown by patient.  ROS:   Please see the history of present illness.    No Fever, chills  or productive  cough All other systems reviewed and are negative.   Recent Lipid Panel Lab Results  Component Value Date/Time   CHOL 89 10/30/2018 07:42 AM   CHOL 95 (L) 05/10/2017 08:29 AM   TRIG 65.0 10/30/2018 07:42 AM   HDL 40.40 10/30/2018 07:42 AM   HDL 37 (L) 05/10/2017 08:29 AM   CHOLHDL 2 10/30/2018 07:42 AM   LDLCALC 36 10/30/2018 07:42 AM   LDLCALC 42 05/10/2017 08:29 AM    Wt Readings from Last 3 Encounters:  01/29/20 223 lb (101.2 kg)  05/05/19 223 lb 9.6 oz (101.4 kg)  02/19/19 220 lb (99.8 kg)     Objective:     Vital Signs:  Ht 5\' 8"  (1.727 m)    Wt 223 lb (101.2 kg)    BMI 33.91 kg/m    VITAL SIGNS:  reviewed NAD Answers questions appropriately Normal affect Remainder of physical examination not performed (telehealth visit; coronavirus pandemic)  ASSESSMENT & PLAN:    1. Coronary artery disease-patient denies chest pain.  Plan to continue medical therapy with aspirin and statin. 2. Hypertension- Continue present medications and follow.  Check potassium and renal function. 3. Hyperlipidemia-continue statin.  Check lipids and liver.  COVID-19 Education: The importance of social distancing was discussed today.  Time:   Today, I have spent 17 minutes with the patient with telehealth technology discussing the above problems.     Medication Adjustments/Labs and Tests Ordered: Current medicines are reviewed at length with the patient today.  Concerns regarding medicines are outlined above.   Tests Ordered: No orders of the defined types were placed in this encounter.   Medication Changes: No orders of the defined types were placed in this encounter.   Follow Up:  Either In Person or Virtual in 1 year(s)  Signed, , MD  01/29/2020 8:40 AM    [facility]

## 2020-01-29 ENCOUNTER — Telehealth (INDEPENDENT_AMBULATORY_CARE_PROVIDER_SITE_OTHER): Payer: 59 | Admitting: Cardiology

## 2020-01-29 ENCOUNTER — Encounter: Payer: Self-pay | Admitting: Cardiology

## 2020-01-29 VITALS — Ht 68.0 in | Wt 223.0 lb

## 2020-01-29 DIAGNOSIS — I2510 Atherosclerotic heart disease of native coronary artery without angina pectoris: Secondary | ICD-10-CM

## 2020-01-29 DIAGNOSIS — E7800 Pure hypercholesterolemia, unspecified: Secondary | ICD-10-CM

## 2020-01-29 DIAGNOSIS — I10 Essential (primary) hypertension: Secondary | ICD-10-CM

## 2020-01-29 NOTE — Patient Instructions (Signed)
Medication Instructions:  NO CHANGE *If you need a refill on your cardiac medications before your next appointment, please call your pharmacy*   Lab Work: Your physician recommends that you return for lab work PRIOR TO EATING  If you have labs (blood work) drawn today and your tests are completely normal, you will receive your results only by: . MyChart Message (if you have MyChart) OR . A paper copy in the mail If you have any lab test that is abnormal or we need to change your treatment, we will call you to review the results.   Follow-Up: At [facility], you and your health needs are our priority.  As part of our continuing mission to provide you with exceptional heart care, we have created designated Provider Care Teams.  These Care Teams include your primary Cardiologist (physician) and Advanced Practice Providers (APPs -  Physician Assistants and Nurse Practitioners) who all work together to provide you with the care you need, when you need it.  We recommend signing up for the patient portal called "MyChart".  Sign up information is provided on this After Visit Summary.  MyChart is used to connect with patients for Virtual Visits (Telemedicine).  Patients are able to view lab/test results, encounter notes, upcoming appointments, etc.  Non-urgent messages can be sent to your provider as well.   To learn more about what you can do with MyChart, go to [email or web address].    Your next appointment:   12 month(s)  The format for your next appointment:   Either In Person or Virtual  Provider:   You may see Brian Crenshaw, MD or one of the following Advanced Practice Providers on your designated Care Team:    Luke Kilroy, PA-C  Callie Goodrich, PA-C  Jesse Cleaver, FNP      

## 2020-02-01 ENCOUNTER — Other Ambulatory Visit: Payer: Self-pay | Admitting: Cardiology

## 2020-02-02 ENCOUNTER — Other Ambulatory Visit: Payer: Self-pay

## 2020-02-03 ENCOUNTER — Encounter: Payer: Self-pay | Admitting: Internal Medicine

## 2020-02-03 ENCOUNTER — Ambulatory Visit (INDEPENDENT_AMBULATORY_CARE_PROVIDER_SITE_OTHER): Payer: 59 | Admitting: Internal Medicine

## 2020-02-03 VITALS — BP 120/72 | HR 81 | Temp 98.5°F | Ht 68.0 in | Wt 229.0 lb

## 2020-02-03 DIAGNOSIS — I10 Essential (primary) hypertension: Secondary | ICD-10-CM | POA: Diagnosis not present

## 2020-02-03 DIAGNOSIS — E785 Hyperlipidemia, unspecified: Secondary | ICD-10-CM

## 2020-02-03 DIAGNOSIS — K219 Gastro-esophageal reflux disease without esophagitis: Secondary | ICD-10-CM | POA: Diagnosis not present

## 2020-02-03 DIAGNOSIS — I2510 Atherosclerotic heart disease of native coronary artery without angina pectoris: Secondary | ICD-10-CM

## 2020-02-03 DIAGNOSIS — E669 Obesity, unspecified: Secondary | ICD-10-CM

## 2020-02-03 NOTE — Patient Instructions (Signed)
-Nice seeing you today!!  -Schedule follow up in 4 months for your physical.   Mediterranean Diet A Mediterranean diet refers to food and lifestyle choices that are based on the traditions of countries located on the [employer] [place]. This way of eating has been shown to help prevent certain conditions and improve outcomes for people who have chronic diseases, like kidney disease and heart disease. What are tips for following this plan? Lifestyle  Cook and eat meals together with your family, when possible.  Drink enough fluid to keep your urine clear or pale yellow.  Be physically active every day. This includes: ? Aerobic exercise like running or swimming. ? Leisure activities like gardening, walking, or housework.  Get 7-8 hours of sleep each night.  If recommended by your health care provider, drink red wine in moderation. This means 1 glass a day for nonpregnant women and 2 glasses a day for men. A glass of wine equals 5 oz (150 mL). Reading food labels   Check the serving size of packaged foods. For foods such as rice and pasta, the serving size refers to the amount of cooked product, not dry.  Check the total fat in packaged foods. Avoid foods that have saturated fat or trans fats.  Check the ingredients list for added sugars, such as corn syrup. Shopping  At the grocery store, buy most of your food from the areas near the walls of the store. This includes: ? Fresh fruits and vegetables (produce). ? Grains, beans, nuts, and seeds. Some of these may be available in unpackaged forms or large amounts (in bulk). ? Fresh seafood. ? Poultry and eggs. ? Low-fat dairy products.  Buy whole ingredients instead of prepackaged foods.  Buy fresh fruits and vegetables in-season from local farmers markets.  Buy frozen fruits and vegetables in resealable bags.  If you do not have access to quality fresh seafood, buy precooked frozen shrimp or canned fish, such as tuna, salmon, or  sardines.  Buy small amounts of raw or cooked vegetables, salads, or olives from the deli or salad bar at your store.  Stock your pantry so you always have certain foods on hand, such as olive oil, canned tuna, canned tomatoes, rice, pasta, and beans. Cooking  Cook foods with extra-virgin olive oil instead of using butter or other vegetable oils.  Have meat as a side dish, and have vegetables or grains as your main dish. This means having meat in small portions or adding small amounts of meat to foods like pasta or stew.  Use beans or vegetables instead of meat in common dishes like chili or lasagna.  Experiment with different cooking methods. Try roasting or broiling vegetables instead of steaming or sauteing them.  Add frozen vegetables to soups, stews, pasta, or rice.  Add nuts or seeds for added healthy fat at each meal. You can add these to yogurt, salads, or vegetable dishes.  Marinate fish or vegetables using olive oil, lemon juice, garlic, and fresh herbs. Meal planning   Plan to eat 1 vegetarian meal one day each week. Try to work up to 2 vegetarian meals, if possible.  Eat seafood 2 or more times a week.  Have healthy snacks readily available, such as: ? Vegetable sticks with hummus. ? [place] yogurt. ? Fruit and nut trail mix.  Eat balanced meals throughout the week. This includes: ? Fruit: 2-3 servings a day ? Vegetables: 4-5 servings a day ? Low-fat dairy: 2 servings a day ? Fish, poultry, or  lean meat: 1 serving a day ? Beans and legumes: 2 or more servings a week ? Nuts and seeds: 1-2 servings a day ? Whole grains: 6-8 servings a day ? Extra-virgin olive oil: 3-4 servings a day  Limit red meat and sweets to only a few servings a month What are my food choices?  Mediterranean diet ? Recommended  Grains: Whole-grain pasta. Brown rice. Bulgar wheat. Polenta. Couscous. Whole-wheat bread. [place].  Vegetables: Artichokes. Beets. Broccoli. Cabbage.  Carrots. Eggplant. Green beans. Chard. Kale. Spinach. Onions. Leeks. Peas. Squash. Tomatoes. Peppers. Radishes.  Fruits: Apples. Apricots. Avocado. Berries. Bananas. Cherries. Dates. Figs. Grapes. Lemons. Melon. Oranges. Peaches. Plums. Pomegranate.  Meats and other protein foods: Beans. Almonds. Sunflower seeds. Pine nuts. Peanuts. [place]. Salmon. Scallops. Shrimp. [place]. Tilapia. Clams. Oysters. Eggs.  Dairy: Low-fat milk. Cheese. Greek yogurt.  Beverages: Water. Red wine. Herbal tea.  Fats and oils: Extra virgin olive oil. Avocado oil. Grape seed oil.  Sweets and desserts: [place] yogurt with honey. Baked apples. Poached pears. Trail mix.  Seasoning and other foods: Basil. Cilantro. Coriander. Cumin. Mint. Parsley. Sage. Rosemary. Tarragon. Garlic. Oregano. Thyme. Pepper. Balsalmic vinegar. Tahini. Hummus. Tomato sauce. Olives. Mushrooms. ? Limit these  Grains: Prepackaged pasta or rice dishes. Prepackaged cereal with added sugar.  Vegetables: Deep fried potatoes (french fries).  Fruits: Fruit canned in syrup.  Meats and other protein foods: Beef. Pork. Lamb. Poultry with skin. Hot dogs. [place] Salines.  Dairy: Ice cream. Sour cream. Whole milk.  Beverages: Juice. Sugar-sweetened soft drinks. Beer. Liquor and spirits.  Fats and oils: Butter. Canola oil. Vegetable oil. Beef fat (tallow). Lard.  Sweets and desserts: Cookies. Cakes. Pies. Candy.  Seasoning and other foods: Mayonnaise. Premade sauces and marinades. The items listed may not be a complete list. Talk with your dietitian about what dietary choices are right for you. Summary  The Mediterranean diet includes both food and lifestyle choices.  Eat a variety of fresh fruits and vegetables, beans, nuts, seeds, and whole grains.  Limit the amount of red meat and sweets that you eat.  Talk with your health care provider about whether it is safe for you to drink red wine in moderation. This means 1 glass a day for nonpregnant women and  2 glasses a day for men. A glass of wine equals 5 oz (150 mL). This information is not intended to replace advice given to you by your health care provider. Make sure you discuss any questions you have with your health care provider. Document Revised: 04/12/2016 Document Reviewed: 04/05/2016 Elsevier Patient Education  [place].

## 2020-02-03 NOTE — Progress Notes (Signed)
Established Patient Office Visit     This visit occurred during the SARS-CoV-2 public health emergency.  Safety protocols were in place, including screening questions prior to the visit, additional usage of staff PPE, and extensive cleaning of exam room while observing appropriate contact time as indicated for disinfecting solutions.    CC/Reason for Visit: Follow-up chronic conditions  HPI: Raymond Norris is a 69 y.o. male who is coming in today for the above mentioned reasons. Past Medical History is significant for: Well-controlled hypertension, well-controlled hyperlipidemia, GERD controlled on PPI as well as a history of coronary artery disease.  He has been doing well since I last saw him.  He has no complaints today.  He did get both of his Covid vaccines.  He recently saw his cardiologist no changes were made.  He is due for his annual physical.   Past Medical/Surgical History: Past Medical History:  Diagnosis Date  . Coronary artery disease   . GERD 04/26/2008  . Myocardial infarction [facility])     Past Surgical History:  Procedure Laterality Date  . CARDIAC CATHETERIZATION N/A 01/31/2015   Procedure: Left Heart Cath and Coronary Angiography;  Surgeon: Tonny Bollman, MD;  Location: [facility] INVASIVE CV LAB;  Service: Cardiovascular;  Laterality: N/A;  . PERCUTANEOUS CORONARY STENT INTERVENTION (PCI-S)  01/2015   LEFT CIRCUMFLEX  1ST & 2ND   OM BRANCHES    Social History:  reports that he has never smoked. He has never used smokeless tobacco. He reports that he does not drink alcohol or use drugs.  Allergies: No Known Allergies  Family History:  Family History  Family history unknown: Yes     Current Outpatient Medications:  .  amLODipine (NORVASC) 5 MG tablet, TAKE 1 TABLET BY MOUTH EVERY DAY, Disp: 90 tablet, Rfl: 3 .  aspirin 81 MG tablet, Take 81 mg by mouth daily., Disp: , Rfl:  .  atorvastatin (LIPITOR) 80 MG tablet, TAKE 1 TABLET(80 MG) BY MOUTH DAILY. FOLLOW-UP  APPOINTMENT WITH DOCTOR CHRISTOPHER, Disp: 90 tablet, Rfl: 3 .  fluticasone (FLONASE) 50 MCG/ACT nasal spray, 2 sprays each nost qd x 1 month, then 1 spry q nost qd, Disp: 16 g, Rfl: 1 .  Multiple Vitamin (MULTIVITAMIN) tablet, Take 1 tablet by mouth daily., Disp: , Rfl:  .  nitroGLYCERIN (NITROSTAT) 0.4 MG SL tablet, Place 1 tablet (0.4 mg total) under the tongue every 5 (five) minutes x 3 doses as needed for chest pain., Disp: 25 tablet, Rfl: 12 .  pantoprazole (PROTONIX) 40 MG tablet, TAKE 1 TABLET(40 MG) BY MOUTH DAILY, Disp: 90 tablet, Rfl: 1  Review of Systems:  Constitutional: Denies fever, chills, diaphoresis, appetite change and fatigue.  HEENT: Denies photophobia, eye pain, redness, hearing loss, ear pain, congestion, sore throat, rhinorrhea, sneezing, mouth sores, trouble swallowing, neck pain, neck stiffness and tinnitus.   Respiratory: Denies SOB, DOE, cough, chest tightness,  and wheezing.   Cardiovascular: Denies chest pain, palpitations and leg swelling.  Gastrointestinal: Denies nausea, vomiting, abdominal pain, diarrhea, constipation, blood in stool and abdominal distention.  Genitourinary: Denies dysuria, urgency, frequency, hematuria, flank pain and difficulty urinating.  Endocrine: Denies: hot or cold intolerance, sweats, changes in hair or nails, polyuria, polydipsia. Musculoskeletal: Denies myalgias, back pain, joint swelling, arthralgias and gait problem.  Skin: Denies pallor, rash and wound.  Neurological: Denies dizziness, seizures, syncope, weakness, light-headedness, numbness and headaches.  Hematological: Denies adenopathy. Easy bruising, personal or family bleeding history  Psychiatric/Behavioral: Denies suicidal ideation, mood changes,  confusion, nervousness, sleep disturbance and agitation    Physical Exam: Vitals:   02/03/20 1504  BP: 120/72  Pulse: 81  Temp: 98.5 F (36.9 C)  TempSrc: Other (Comment)  SpO2: 93%  Weight: 229 lb (103.9 kg)  Height: 5'  8" (1.727 m)    Body mass index is 34.82 kg/m.   Constitutional: NAD, calm, comfortable Eyes: PERRL, lids and conjunctivae normal ENMT: Mucous membranes are moist.  Respiratory: clear to auscultation bilaterally, no wheezing, no crackles. Normal respiratory effort. No accessory muscle use.  Cardiovascular: Regular rate and rhythm, no murmurs / rubs / gallops. No extremity edema.  Neurologic: Grossly intact and nonfocal Psychiatric: Normal judgment and insight. Alert and oriented x 3. Normal mood.    Impression and Plan:  Dyslipidemia -Last LDL was 36 in March 2020, continue atorvastatin.  Gastroesophageal reflux disease, unspecified whether esophagitis present -Well-controlled on PPI therapy.  Benign essential HTN -Well-controlled on current medications.  Coronary artery disease involving native coronary artery of native heart without angina pectoris -Compensated, no chest pain or shortness of breath, follows routinely with cardiology.  Obesity (BMI 30.0-34.9) -Discussed healthy lifestyle, including increased physical activity and better food choices to promote weight loss. -We have talked about this at length today, he will try to incorporate 30 to 45 minutes 3 days a week of exercise and try a low-carb diet.    Patient Instructions  -Nice seeing you today!!  -Schedule follow up in 4 months for your physical.   Mediterranean Diet A Mediterranean diet refers to food and lifestyle choices that are based on the traditions of countries located on the [employer]. This way of eating has been shown to help prevent certain conditions and improve outcomes for people who have chronic diseases, like kidney disease and heart disease. What are tips for following this plan? Lifestyle  Cook and eat meals together with your family, when possible.  Drink enough fluid to keep your urine clear or pale yellow.  Be physically active every day. This includes: ? Aerobic exercise  like running or swimming. ? Leisure activities like gardening, walking, or housework.  Get 7-8 hours of sleep each night.  If recommended by your health care provider, drink red wine in moderation. This means 1 glass a day for nonpregnant women and 2 glasses a day for men. A glass of wine equals 5 oz (150 mL). Reading food labels   Check the serving size of packaged foods. For foods such as rice and pasta, the serving size refers to the amount of cooked product, not dry.  Check the total fat in packaged foods. Avoid foods that have saturated fat or trans fats.  Check the ingredients list for added sugars, such as corn syrup. Shopping  At the grocery store, buy most of your food from the areas near the walls of the store. This includes: ? Fresh fruits and vegetables (produce). ? Grains, beans, nuts, and seeds. Some of these may be available in unpackaged forms or large amounts (in bulk). ? Fresh seafood. ? Poultry and eggs. ? Low-fat dairy products.  Buy whole ingredients instead of prepackaged foods.  Buy fresh fruits and vegetables in-season from local farmers markets.  Buy frozen fruits and vegetables in resealable bags.  If you do not have access to quality fresh seafood, buy precooked frozen shrimp or canned fish, such as tuna, salmon, or sardines.  Buy small amounts of raw or cooked vegetables, salads, or olives from the deli or salad bar at your store.  Stock your pantry so you always have certain foods on hand, such as olive oil, canned tuna, canned tomatoes, rice, pasta, and beans. Cooking  Cook foods with extra-virgin olive oil instead of using butter or other vegetable oils.  Have meat as a side dish, and have vegetables or grains as your main dish. This means having meat in small portions or adding small amounts of meat to foods like pasta or stew.  Use beans or vegetables instead of meat in common dishes like chili or lasagna.  Experiment with different cooking  methods. Try roasting or broiling vegetables instead of steaming or sauteing them.  Add frozen vegetables to soups, stews, pasta, or rice.  Add nuts or seeds for added healthy fat at each meal. You can add these to yogurt, salads, or vegetable dishes.  Marinate fish or vegetables using olive oil, lemon juice, garlic, and fresh herbs. Meal planning   Plan to eat 1 vegetarian meal one day each week. Try to work up to 2 vegetarian meals, if possible.  Eat seafood 2 or more times a week.  Have healthy snacks readily available, such as: ? Vegetable sticks with hummus. ? [place] yogurt. ? Fruit and nut trail mix.  Eat balanced meals throughout the week. This includes: ? Fruit: 2-3 servings a day ? Vegetables: 4-5 servings a day ? Low-fat dairy: 2 servings a day ? Fish, poultry, or lean meat: 1 serving a day ? Beans and legumes: 2 or more servings a week ? Nuts and seeds: 1-2 servings a day ? Whole grains: 6-8 servings a day ? Extra-virgin olive oil: 3-4 servings a day  Limit red meat and sweets to only a few servings a month What are my food choices?  Mediterranean diet ? Recommended  Grains: Whole-grain pasta. Brown rice. Bulgar wheat. Polenta. Couscous. Whole-wheat bread. [place].  Vegetables: Artichokes. Beets. Broccoli. Cabbage. Carrots. Eggplant. Green beans. Chard. Kale. Spinach. Onions. Leeks. Peas. Squash. Tomatoes. Peppers. Radishes.  Fruits: Apples. Apricots. Avocado. Berries. Bananas. Cherries. Dates. Figs. Grapes. Lemons. Melon. Oranges. Peaches. Plums. Pomegranate.  Meats and other protein foods: Beans. Almonds. Sunflower seeds. Pine nuts. Peanuts. [place]. Salmon. Scallops. Shrimp. [place]. Tilapia. Clams. Oysters. Eggs.  Dairy: Low-fat milk. Cheese. Greek yogurt.  Beverages: Water. Red wine. Herbal tea.  Fats and oils: Extra virgin olive oil. Avocado oil. Grape seed oil.  Sweets and desserts: [place] yogurt with honey. Baked apples. Poached pears. Trail  mix.  Seasoning and other foods: Basil. Cilantro. Coriander. Cumin. Mint. Parsley. Sage. Rosemary. Tarragon. Garlic. Oregano. Thyme. Pepper. Balsalmic vinegar. Tahini. Hummus. Tomato sauce. Olives. Mushrooms. ? Limit these  Grains: Prepackaged pasta or rice dishes. Prepackaged cereal with added sugar.  Vegetables: Deep fried potatoes (french fries).  Fruits: Fruit canned in syrup.  Meats and other protein foods: Beef. Pork. Lamb. Poultry with skin. Hot dogs. [place] Salines.  Dairy: Ice cream. Sour cream. Whole milk.  Beverages: Juice. Sugar-sweetened soft drinks. Beer. Liquor and spirits.  Fats and oils: Butter. Canola oil. Vegetable oil. Beef fat (tallow). Lard.  Sweets and desserts: Cookies. Cakes. Pies. Candy.  Seasoning and other foods: Mayonnaise. Premade sauces and marinades. The items listed may not be a complete list. Talk with your dietitian about what dietary choices are right for you. Summary  The Mediterranean diet includes both food and lifestyle choices.  Eat a variety of fresh fruits and vegetables, beans, nuts, seeds, and whole grains.  Limit the amount of red meat and sweets that you eat.  Talk with your health care  provider about whether it is safe for you to drink red wine in moderation. This means 1 glass a day for nonpregnant women and 2 glasses a day for men. A glass of wine equals 5 oz (150 mL). This information is not intended to replace advice given to you by your health care provider. Make sure you discuss any questions you have with your health care provider. Document Revised: 04/12/2016 Document Reviewed: 04/05/2016 Elsevier Patient Education  2020 Elsevier Inc.      Chaya Jan, MD [facility] at [facility]

## 2020-02-12 ENCOUNTER — Other Ambulatory Visit: Payer: Self-pay

## 2020-02-12 MED ORDER — AMLODIPINE BESYLATE 5 MG PO TABS: 5.0000 mg | ORAL_TABLET | Freq: Every day | ORAL | 3 refills | Status: DC

## 2020-06-01 ENCOUNTER — Ambulatory Visit: Payer: 59 | Admitting: Internal Medicine

## 2020-06-01 ENCOUNTER — Other Ambulatory Visit: Payer: Self-pay

## 2020-06-08 ENCOUNTER — Other Ambulatory Visit: Payer: Self-pay | Admitting: Cardiology

## 2020-06-08 MED ORDER — ATORVASTATIN CALCIUM 80 MG PO TABS: 80.0000 mg | ORAL_TABLET | Freq: Every day | ORAL | 1 refills | Status: DC

## 2020-06-08 NOTE — Telephone Encounter (Signed)
*  STAT* If patient is at the pharmacy, call can be transferred to refill team.   1. Which medications need to be refilled? (please list name of each medication and dose if known) atorvastatin (LIPITOR) 80 MG tablet  2. Which pharmacy/location (including street and city if local pharmacy) is medication to be sent to? [id number] - [place], [place] - [address] AT [facility] [address]  3. Do they need a 30 day or 90 day supply? 90

## 2020-06-08 NOTE — Telephone Encounter (Signed)
Rx request sent to pharmacy.  

## 2020-07-05 ENCOUNTER — Other Ambulatory Visit: Payer: Self-pay | Admitting: Internal Medicine

## 2020-07-05 DIAGNOSIS — K219 Gastro-esophageal reflux disease without esophagitis: Secondary | ICD-10-CM

## 2020-07-06 ENCOUNTER — Other Ambulatory Visit: Payer: Self-pay | Admitting: Internal Medicine

## 2020-07-06 DIAGNOSIS — K219 Gastro-esophageal reflux disease without esophagitis: Secondary | ICD-10-CM

## 2020-12-08 ENCOUNTER — Other Ambulatory Visit: Payer: Self-pay | Admitting: Cardiology

## 2021-02-10 ENCOUNTER — Other Ambulatory Visit: Payer: Self-pay | Admitting: Cardiology

## 2021-03-15 ENCOUNTER — Telehealth: Payer: Self-pay | Admitting: Cardiology

## 2021-03-15 MED ORDER — ATORVASTATIN CALCIUM 80 MG PO TABS: ORAL_TABLET | ORAL | 0 refills | Status: DC

## 2021-03-15 MED ORDER — AMLODIPINE BESYLATE 5 MG PO TABS: ORAL_TABLET | ORAL | 0 refills | Status: DC

## 2021-03-15 NOTE — Telephone Encounter (Signed)
Called Pt , left vm informing them medication was sent to pharmacy

## 2021-03-15 NOTE — Telephone Encounter (Signed)
*  STAT* If patient is at the pharmacy, call can be transferred to refill team.   1. Which medications need to be refilled? (please list name of each medication and dose if known) amLODipine (NORVASC) 5 MG tablet atorvastatin (LIPITOR) 80 MG tablet  2. Which pharmacy/location (including street and city if local pharmacy) is medication to be sent to? [id number] - [place], [place] - [address] AT [facility] [address]  3. Do they need a 30 day or 90 day supply? 90

## 2021-03-17 IMAGING — DX LEFT RIBS AND CHEST - 3+ VIEW
5 series · 5 of 5 positions shown · non-contrast
Comparison: Chest x-ray August 01, 2018.

CLINICAL DATA: Status post fall with left rib pain.

EXAM:
LEFT RIBS AND CHEST - 3+ VIEW

[chest pa]
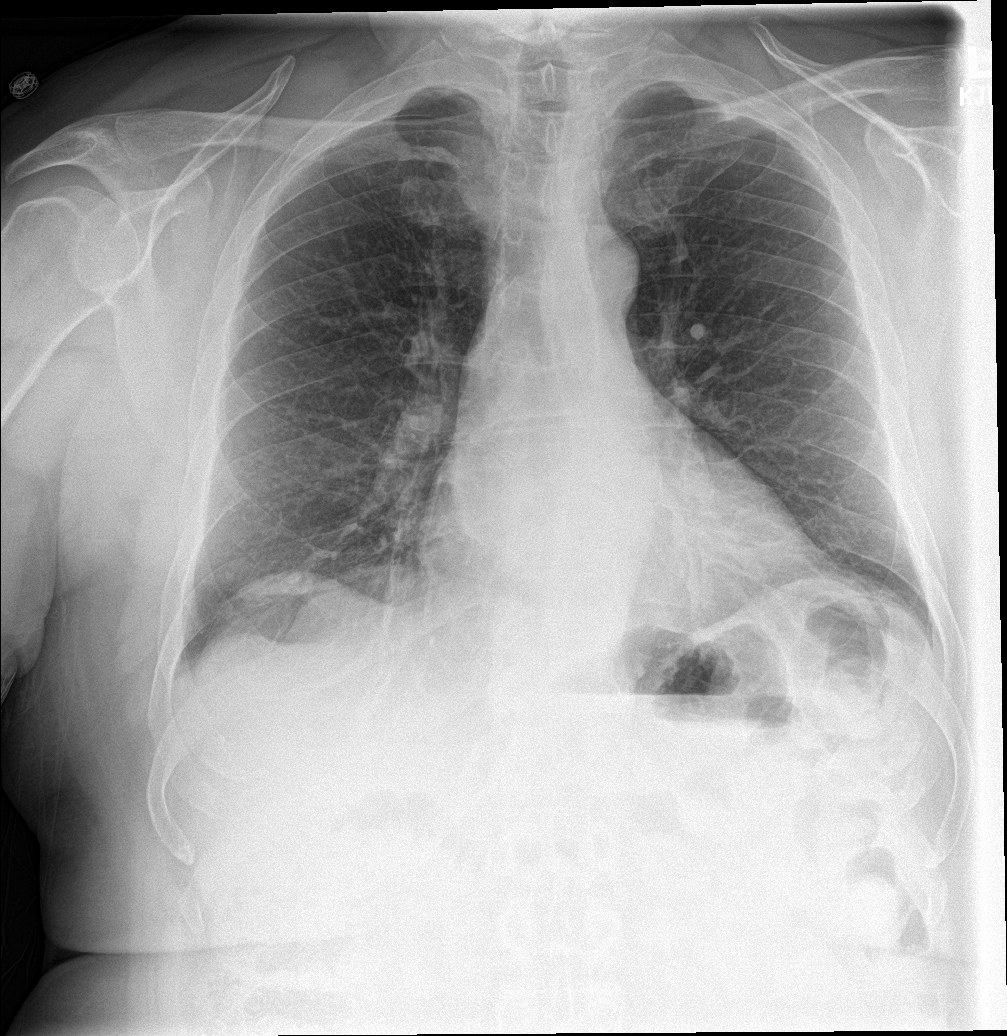

[rib pa obl (1 of 2)]
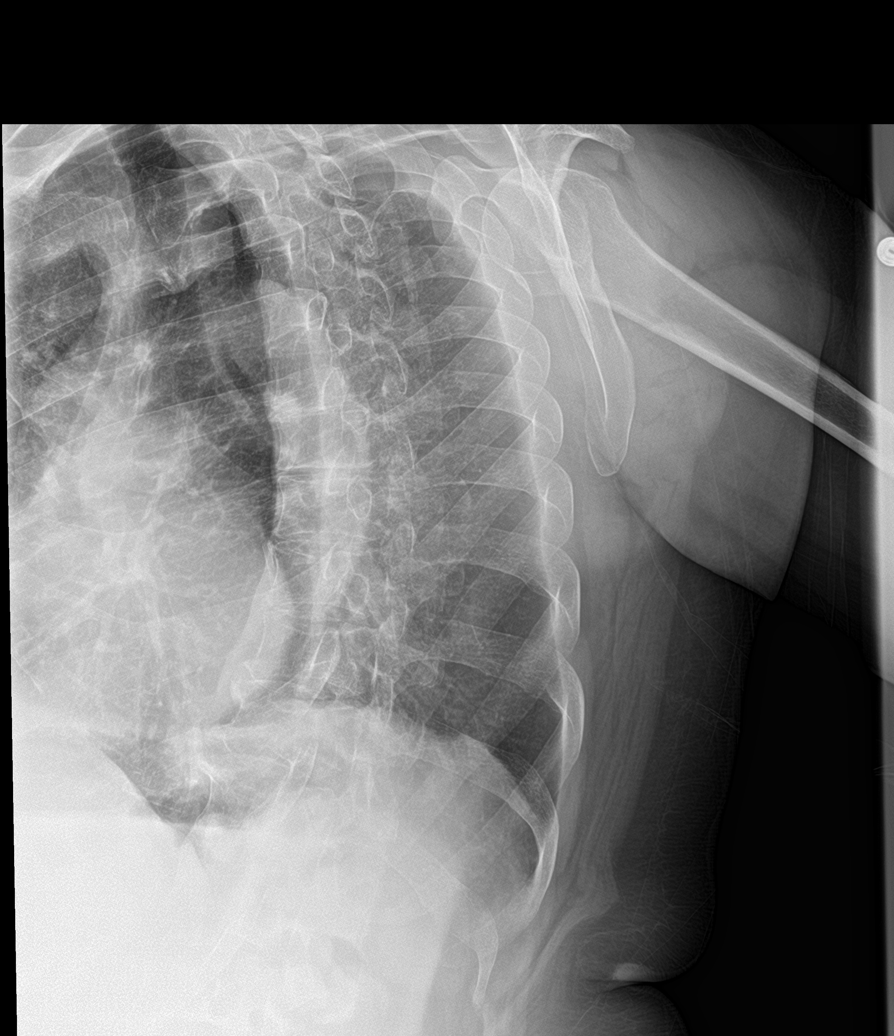

[rib pa (1 of 2)]
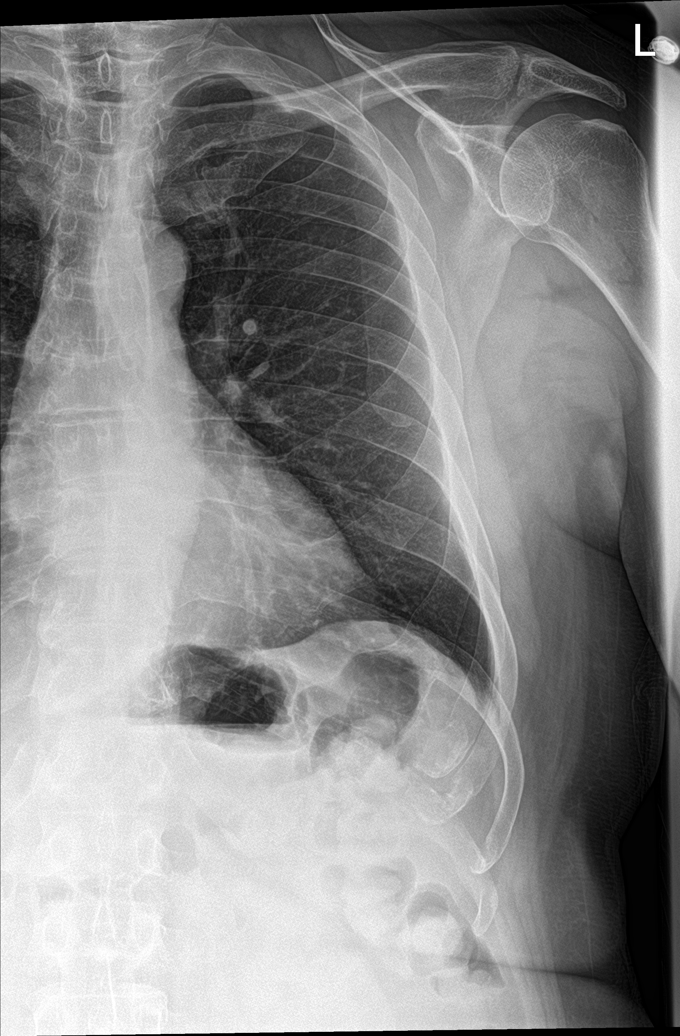

[rib pa (2 of 2)]
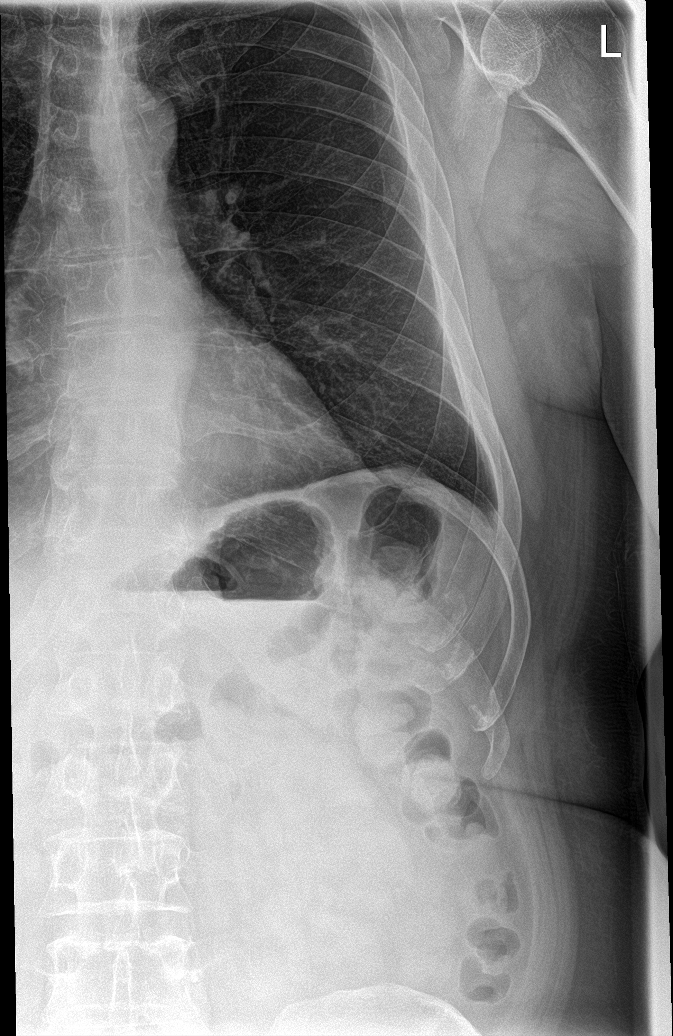

[rib pa obl (2 of 2)]
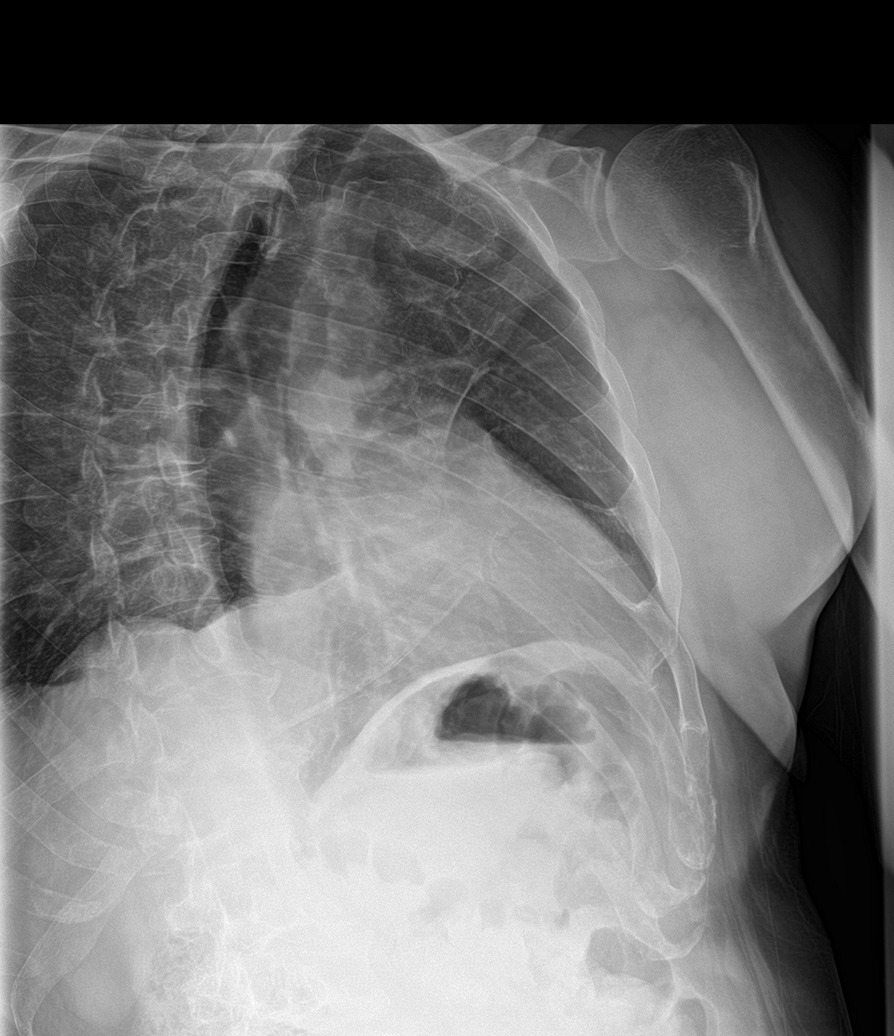

[5 of 5 positions shown; findings below may reference images not displayed]

FINDINGS: No fracture or dislocation are seen involving the ribs. There is no
evidence of pneumothorax or pleural effusion. Both lungs are clear.
Heart size and mediastinal contours are stable.
IMPRESSION: No acute fracture or dislocation left ribs.

## 2021-06-12 ENCOUNTER — Other Ambulatory Visit: Payer: Self-pay | Admitting: Cardiology

## 2021-08-28 NOTE — Progress Notes (Deleted)
HPI:FU CAD. Patient had cardiac catheterization June 2016. There was a 100% second marginal and a 90% first marginal. He had drug-eluting stents to both lesions. There was no obstructive disease in the right coronary artery or LAD. LV function was normal. Abdominal ultrasound 12/16 showed no aneurysm.  Monitor June 2020 showed sinus rhythm with occasional PAC, PVC and brief PAT.  Nuclear study June 2020 showed ejection fraction 46%, inferior infarct versus attenuation and no ischemia.  Since last seen,  Current Outpatient Medications  Medication Sig Dispense Refill   amLODipine (NORVASC) 5 MG tablet TAKE 1 TABLET(5 MG) BY MOUTH DAILY 90 tablet 0   aspirin 81 MG tablet Take 81 mg by mouth daily.     atorvastatin (LIPITOR) 80 MG tablet TAKE 1 TABLET(80 MG) BY MOUTH DAILY 90 tablet 0   fluticasone (FLONASE) 50 MCG/ACT nasal spray 2 sprays each nost qd x 1 month, then 1 spry q nost qd 16 g 1   Multiple Vitamin (MULTIVITAMIN) tablet Take 1 tablet by mouth daily.     nitroGLYCERIN (NITROSTAT) 0.4 MG SL tablet Place 1 tablet (0.4 mg total) under the tongue every 5 (five) minutes x 3 doses as needed for chest pain. 25 tablet 12   pantoprazole (PROTONIX) 40 MG tablet TAKE 1 TABLET(40 MG) BY MOUTH DAILY 90 tablet 1   No current facility-administered medications for this visit.     Past Medical History:  Diagnosis Date   Coronary artery disease    GERD 04/26/2008   Myocardial infarction [facility])     Past Surgical History:  Procedure Laterality Date   CARDIAC CATHETERIZATION N/A 01/31/2015   Procedure: Left Heart Cath and Coronary Angiography;  Surgeon: Sherren Mocha, MD;  Location: [place] CV LAB;  Service: Cardiovascular;  Laterality: N/A;   PERCUTANEOUS CORONARY STENT INTERVENTION (PCI-S)  01/2015   LEFT CIRCUMFLEX  1ST & 2ND   OM BRANCHES    Social History   Socioeconomic History   Marital status: Married    Spouse name: Not on file   Number of children: Not on file   Years of  education: Not on file   Highest education level: Not on file  Occupational History   Not on file  Tobacco Use   Smoking status: Never   Smokeless tobacco: Never  Substance and Sexual Activity   Alcohol use: No   Drug use: No   Sexual activity: Not on file  Other Topics Concern   Not on file  Social History Narrative   Not on file   Social Determinants of Health   Financial Resource Strain: Not on file  Food Insecurity: Not on file  Transportation Needs: Not on file  Physical Activity: Not on file  Stress: Not on file  Social Connections: Not on file  Intimate Partner Violence: Not on file    Family History  Family history unknown: Yes    ROS: no fevers or chills, productive cough, hemoptysis, dysphasia, odynophagia, melena, hematochezia, dysuria, hematuria, rash, seizure activity, orthopnea, PND, pedal edema, claudication. Remaining systems are negative.  Physical Exam: Well-developed well-nourished in no acute distress.  Skin is warm and dry.  HEENT is normal.  Neck is supple.  Chest is clear to auscultation with normal expansion.  Cardiovascular exam is regular rate and rhythm.  Abdominal exam nontender or distended. No masses palpated. Extremities show no edema. neuro grossly intact  ECG- personally reviewed  A/P  1 coronary artery disease-patient doing well from a symptomatic standpoint.  Continue aspirin and statin.  2 hypertension-blood pressure controlled.  Continue present medical regimen.  Check potassium and renal function.  3 hyperlipidemia-continue statin.  Check lipids and liver.  Kirk Ruths, MD

## 2021-09-07 ENCOUNTER — Ambulatory Visit: Payer: 59 | Admitting: Cardiology

## 2021-09-11 ENCOUNTER — Other Ambulatory Visit: Payer: Self-pay | Admitting: Cardiology

## 2021-09-12 ENCOUNTER — Other Ambulatory Visit: Payer: Self-pay | Admitting: Cardiology

## 2021-09-14 ENCOUNTER — Other Ambulatory Visit: Payer: Self-pay

## 2021-09-14 ENCOUNTER — Telehealth: Payer: Self-pay

## 2021-09-14 ENCOUNTER — Telehealth: Payer: Self-pay | Admitting: Cardiology

## 2021-09-14 MED ORDER — ATORVASTATIN CALCIUM 80 MG PO TABS: ORAL_TABLET | ORAL | 1 refills | Status: DC

## 2021-09-14 NOTE — Telephone Encounter (Signed)
Called patient to advise medication request sent to pharmacy.

## 2021-09-14 NOTE — Telephone Encounter (Signed)
°*  STAT* If patient is at the pharmacy, call can be transferred to refill team.   1. Which medications need to be refilled? (please list name of each medication and dose if known) atorvastatin (LIPITOR) 80 MG tablet  2. Which pharmacy/location (including street and city if local pharmacy) is medication to be sent to? [place], [place]  3. Do they need a 30 day or 90 day supply? 90 day

## 2021-09-18 ENCOUNTER — Telehealth: Payer: Self-pay | Admitting: Cardiology

## 2021-09-18 MED ORDER — ATORVASTATIN CALCIUM 80 MG PO TABS: ORAL_TABLET | ORAL | 1 refills | Status: DC

## 2021-09-18 NOTE — Telephone Encounter (Signed)
Pt Rx for Lipitor 30 days with one refill sent to [facility] pharmacy. Pt notified and voiced understanding that he will come to scheduled appt with Dr. Jens Som on 10/24/21 at 1:30 pm.

## 2021-09-18 NOTE — Telephone Encounter (Signed)
Pt c/o medication issue:  1. Name of Medication: atorvastatin (LIPITOR) 80 MG tablet  2. How are you currently taking this medication (dosage and times per day)? Patient said he has been out of medication and has not taken the medication for 4 days  3. Are you having a reaction (difficulty breathing--STAT)?   4. What is your medication issue? Patient said spmething about the pharmacy told him he needed a letter stating he needs to take this medication. HE was not sure what that meant

## 2021-10-10 ENCOUNTER — Other Ambulatory Visit: Payer: Self-pay | Admitting: Cardiology

## 2021-10-19 NOTE — Progress Notes (Signed)
HPI:FU CAD. Patient had cardiac catheterization June 2016. There was a 100% second marginal and a 90% first marginal. He had drug-eluting stents to both lesions. There was no obstructive disease in the right coronary artery or LAD. LV function was normal. Abdominal ultrasound 12/16 showed no aneurysm.  Monitor June 2020 showed sinus rhythm with occasional PAC, PVC and brief PAT.  Nuclear study June 2020 showed ejection fraction 46%, inferior infarct versus attenuation and no ischemia.  Since last seen, the patient has dyspnea with more extreme activities but not with routine activities. It is relieved with rest. It is not associated with chest pain. There is no orthopnea, PND or pedal edema. There is no syncope or palpitations. There is no exertional chest pain.   Current Outpatient Medications  Medication Sig Dispense Refill   amLODipine (NORVASC) 5 MG tablet Take 1 tablet (5 mg total) by mouth daily. KEEP OV. 30 tablet 1   atorvastatin (LIPITOR) 80 MG tablet Pt  MUST KEEP SCHEDULED APPT FOR FUTURE REFILLS. TAKE 1 TABLET(80 MG) BY MOUTH DAILY 30 tablet 1   fluticasone (FLONASE) 50 MCG/ACT nasal spray 2 sprays each nost qd x 1 month, then 1 spry q nost qd 16 g 1   nitroGLYCERIN (NITROSTAT) 0.4 MG SL tablet Place 1 tablet (0.4 mg total) under the tongue every 5 (five) minutes x 3 doses as needed for chest pain. 25 tablet 12   No current facility-administered medications for this visit.     Past Medical History:  Diagnosis Date   Coronary artery disease    GERD 04/26/2008   Myocardial infarction [facility])     Past Surgical History:  Procedure Laterality Date   CARDIAC CATHETERIZATION N/A 01/31/2015   Procedure: Left Heart Cath and Coronary Angiography;  Surgeon: Sherren Mocha, MD;  Location: [place] CV LAB;  Service: Cardiovascular;  Laterality: N/A;   PERCUTANEOUS CORONARY STENT INTERVENTION (PCI-S)  01/2015   LEFT CIRCUMFLEX  1ST & 2ND   OM BRANCHES    Social History    Socioeconomic History   Marital status: Married    Spouse name: Not on file   Number of children: Not on file   Years of education: Not on file   Highest education level: Not on file  Occupational History   Not on file  Tobacco Use   Smoking status: Never   Smokeless tobacco: Never  Substance and Sexual Activity   Alcohol use: No   Drug use: No   Sexual activity: Not on file  Other Topics Concern   Not on file  Social History Narrative   Not on file   Social Determinants of Health   Financial Resource Strain: Not on file  Food Insecurity: Not on file  Transportation Needs: Not on file  Physical Activity: Not on file  Stress: Not on file  Social Connections: Not on file  Intimate Partner Violence: Not on file    Family History  Family history unknown: Yes    ROS: no fevers or chills, productive cough, hemoptysis, dysphasia, odynophagia, melena, hematochezia, dysuria, hematuria, rash, seizure activity, orthopnea, PND, pedal edema, claudication. Remaining systems are negative.  Physical Exam: Well-developed well-nourished in no acute distress.  Skin is warm and dry.  HEENT is normal.  Neck is supple.  Chest is clear to auscultation with normal expansion.  Cardiovascular exam is regular rate and rhythm.  Abdominal exam nontender or distended. No masses palpated. Extremities show no edema. neuro grossly intact  ECG-normal sinus rhythm  at a rate of 69, no ST changes personally reviewed  A/P  1 coronary artery disease-patient doing well from a symptomatic standpoint.  Plan to continue medical therapy with Lipitor.  Resume aspirin 81 mg daily.  2 hyperlipidemia-continue statin.  Check lipids and liver.  3 hypertension-plan to continue present medical therapy.  Check potassium and renal function.  Kirk Ruths, MD

## 2021-10-24 ENCOUNTER — Other Ambulatory Visit: Payer: Self-pay

## 2021-10-24 ENCOUNTER — Encounter: Payer: Self-pay | Admitting: Cardiology

## 2021-10-24 ENCOUNTER — Ambulatory Visit: Payer: 59 | Admitting: Cardiology

## 2021-10-24 VITALS — BP 108/70 | HR 69 | Ht 68.0 in | Wt 211.4 lb

## 2021-10-24 DIAGNOSIS — E7800 Pure hypercholesterolemia, unspecified: Secondary | ICD-10-CM

## 2021-10-24 DIAGNOSIS — I2510 Atherosclerotic heart disease of native coronary artery without angina pectoris: Secondary | ICD-10-CM

## 2021-10-24 DIAGNOSIS — I10 Essential (primary) hypertension: Secondary | ICD-10-CM

## 2021-10-24 NOTE — Patient Instructions (Signed)

## 2021-11-13 ENCOUNTER — Encounter: Payer: Self-pay | Admitting: *Deleted

## 2021-11-15 ENCOUNTER — Other Ambulatory Visit: Payer: Self-pay | Admitting: Cardiology

## 2021-11-23 ENCOUNTER — Encounter: Payer: Self-pay | Admitting: *Deleted

## 2021-11-23 LAB — COMPREHENSIVE METABOLIC PANEL
ALT: 22 IU/L (ref 0–44)
AST: 19 IU/L (ref 0–40)
Albumin/Globulin Ratio: 1.7 (ref 1.2–2.2)
Albumin: 4.3 g/dL (ref 3.7–4.7)
Alkaline Phosphatase: 51 IU/L (ref 44–121)
BUN/Creatinine Ratio: 16 (ref 10–24)
BUN: 19 mg/dL (ref 8–27)
Bilirubin Total: 1.1 mg/dL (ref 0.0–1.2)
CO2: 24 mmol/L (ref 20–29)
Calcium: 9.2 mg/dL (ref 8.6–10.2)
Chloride: 107 mmol/L — ABNORMAL HIGH (ref 96–106)
Creatinine, Ser: 1.19 mg/dL (ref 0.76–1.27)
Globulin, Total: 2.5 g/dL (ref 1.5–4.5)
Glucose: 93 mg/dL (ref 70–99)
Potassium: 4.4 mmol/L (ref 3.5–5.2)
Sodium: 144 mmol/L (ref 134–144)
Total Protein: 6.8 g/dL (ref 6.0–8.5)
eGFR: 65 mL/min/{1.73_m2} (ref >59)

## 2021-11-23 LAB — LIPID PANEL
Chol/HDL Ratio: 2.5 ratio (ref 0.0–5.0)
Cholesterol, Total: 95 mg/dL — ABNORMAL LOW (ref 100–199)
HDL: 38 mg/dL — ABNORMAL LOW (ref >39)
LDL Chol Calc (NIH): 42 mg/dL (ref 0–99)
Triglycerides: 69 mg/dL (ref 0–149)
VLDL Cholesterol Cal: 15 mg/dL (ref 5–40)

## 2021-11-29 ENCOUNTER — Ambulatory Visit (INDEPENDENT_AMBULATORY_CARE_PROVIDER_SITE_OTHER): Payer: 59 | Admitting: Internal Medicine

## 2021-11-29 ENCOUNTER — Encounter: Payer: Self-pay | Admitting: Internal Medicine

## 2021-11-29 VITALS — BP 120/70 | HR 66 | Temp 98.8°F | Ht 68.0 in | Wt 208.2 lb

## 2021-11-29 DIAGNOSIS — J029 Acute pharyngitis, unspecified: Secondary | ICD-10-CM

## 2021-11-29 DIAGNOSIS — J069 Acute upper respiratory infection, unspecified: Secondary | ICD-10-CM

## 2021-11-29 LAB — POCT RAPID STREP A (OFFICE): Rapid Strep A Screen: NEGATIVE

## 2021-11-29 NOTE — Patient Instructions (Addendum)
-  Nice seeing you today!! ? ? ?-Schedule follow up in 3 months for a physical. Come in fasting that day. ?

## 2021-11-29 NOTE — Progress Notes (Signed)
? ? ?Acute office Visit ? ? ? ? ?This visit occurred during the SARS-CoV-2 public health emergency.  Safety protocols were in place, including screening questions prior to the visit, additional usage of staff PPE, and extensive cleaning of exam room while observing appropriate contact time as indicated for disinfecting solutions.  ? ? ?CC/Reason for Visit: Sore throat ? ?HPI: Raymond Norris is a 71 y.o. male who is coming in today for the above mentioned reasons.  He states he had cold-like symptoms about 10 days ago mainly consisting of rhinorrhea throat clearing and postnasal drip.  He has never had fever or body aches.  He never tested for COVID.  Most of his symptoms have resolved but he remains with a severe sore throat with difficulty swallowing.  He has been taking cetirizine and Flonase without relief. ? ?Past Medical/Surgical History: ?Past Medical History:  ?Diagnosis Date  ? Coronary artery disease   ? GERD 04/26/2008  ? Myocardial infarction [facility])   ? ? ?Past Surgical History:  ?Procedure Laterality Date  ? CARDIAC CATHETERIZATION N/A 01/31/2015  ? Procedure: Left Heart Cath and Coronary Angiography;  Surgeon: Tonny Bollman, MD;  Location: [facility] INVASIVE CV LAB;  Service: Cardiovascular;  Laterality: N/A;  ? PERCUTANEOUS CORONARY STENT INTERVENTION (PCI-S)  01/2015  ? LEFT CIRCUMFLEX  1ST & 2ND   [facility]  ? ? ?Social History: ? reports that he has never smoked. He has never used smokeless tobacco. He reports that he does not drink alcohol and does not use drugs. ? ?Allergies: ?No Known Allergies ? ?Family History:  ?Family History  ?Family history unknown: Yes  ? ? ? ?Current Outpatient Medications:  ?  amLODipine (NORVASC) 5 MG tablet, Take 1 tablet (5 mg total) by mouth daily. KEEP OV., Disp: 30 tablet, Rfl: 1 ?  atorvastatin (LIPITOR) 80 MG tablet, TAKE 1 TABLET BY MOUTH DAILY, Disp: 30 tablet, Rfl: 1 ?  fluticasone (FLONASE) 50 MCG/ACT nasal spray, 2 sprays each nost qd x 1 month, then 1 spry q nost  qd, Disp: 16 g, Rfl: 1 ?  nitroGLYCERIN (NITROSTAT) 0.4 MG SL tablet, Place 1 tablet (0.4 mg total) under the tongue every 5 (five) minutes x 3 doses as needed for chest pain., Disp: 25 tablet, Rfl: 12 ? ?Review of Systems:  ?Constitutional: Denies fever, chills, diaphoresis, appetite change and fatigue.  ?HEENT: Denies photophobia, eye pain, redness, mouth sores, trouble swallowing, neck pain, neck stiffness and tinnitus.   ?Respiratory: Denies SOB, DOE, cough, chest tightness,  and wheezing.   ?Cardiovascular: Denies chest pain, palpitations and leg swelling.  ?Gastrointestinal: Denies nausea, vomiting, abdominal pain, diarrhea, constipation, blood in stool and abdominal distention.  ?Genitourinary: Denies dysuria, urgency, frequency, hematuria, flank pain and difficulty urinating.  ?Endocrine: Denies: hot or cold intolerance, sweats, changes in hair or nails, polyuria, polydipsia. ?Musculoskeletal: Denies myalgias, back pain, joint swelling, arthralgias and gait problem.  ?Skin: Denies pallor, rash and wound.  ?Neurological: Denies dizziness, seizures, syncope, weakness, light-headedness, numbness and headaches.  ?Hematological: Denies adenopathy. Easy bruising, personal or family bleeding history  ?Psychiatric/Behavioral: Denies suicidal ideation, mood changes, confusion, nervousness, sleep disturbance and agitation ? ? ? ?Physical Exam: ?Vitals:  ? 11/29/21 1349  ?BP: 120/70  ?Pulse: 66  ?Temp: 98.8 ?F (37.1 ?C)  ?TempSrc: Oral  ?SpO2: 98%  ?Weight: 208 lb 3.2 oz (94.4 kg)  ?Height: 5\' 8"  (1.727 m)  ? ? ?Body mass index is 31.66 kg/m?. ? ? ?Constitutional: NAD, calm, comfortable ?Eyes: PERRL, lids and conjunctivae  normal ?ENMT: Mucous membranes are moist. Posterior pharynx is erythematous with tonsillar enlargement but clear of any exudate or lesions. Normal dentition. Tympanic membrane is pearly white, no erythema or bulging. ?Neck: normal, supple, no masses, no thyromegaly ?Respiratory: clear to auscultation  bilaterally, no wheezing, no crackles. Normal respiratory effort.  ?Psychiatric: Normal judgment and insight. Alert and oriented x 3. Normal mood.  ? ? ?Impression and Plan: ? ?Viral upper respiratory tract infection ? ?Sore throat ? ?-In office strep test is negative, continue daily antihistamine, Flonase, Mucinex. ? ?Time spent:23 minutes reviewing chart, interviewing and examining patient and formulating plan of care. ? ? ?Patient Instructions  ?-Nice seeing you today!! ? ? ?-Schedule follow up in 3 months for a physical. Come in fasting that day. ? ? ? ?Chaya Jan, MD ?[facility] ? ? ?

## 2021-11-29 NOTE — Addendum Note (Signed)
Addended by: Mary Sella D on: 11/29/2021 02:48 PM ? ? Modules accepted: Orders ? ?

## 2021-12-09 ENCOUNTER — Other Ambulatory Visit: Payer: Self-pay | Admitting: Cardiology

## 2021-12-29 ENCOUNTER — Emergency Department (HOSPITAL_COMMUNITY)
Admission: EM | Admit: 2021-12-29 | Discharge: 2021-12-29 | Disposition: A | Discharge status: Home / Self Care | Payer: 59 | Source: Home / Self Care | Attending: Emergency Medicine | Admitting: Emergency Medicine

## 2021-12-29 ENCOUNTER — Emergency Department (HOSPITAL_COMMUNITY)
Admission: EM | Admit: 2021-12-29 | Discharge: 2021-12-29 | Discharge status: Against Medical Advice | Payer: 59 | Attending: Emergency Medicine | Admitting: Emergency Medicine

## 2021-12-29 ENCOUNTER — Other Ambulatory Visit: Payer: Self-pay

## 2021-12-29 ENCOUNTER — Encounter (HOSPITAL_COMMUNITY): Payer: Self-pay

## 2021-12-29 DIAGNOSIS — R7989 Other specified abnormal findings of blood chemistry: Secondary | ICD-10-CM | POA: Insufficient documentation

## 2021-12-29 DIAGNOSIS — I10 Essential (primary) hypertension: Secondary | ICD-10-CM | POA: Insufficient documentation

## 2021-12-29 DIAGNOSIS — Z5321 Procedure and treatment not carried out due to patient leaving prior to being seen by health care provider: Secondary | ICD-10-CM | POA: Insufficient documentation

## 2021-12-29 DIAGNOSIS — K4090 Unilateral inguinal hernia, without obstruction or gangrene, not specified as recurrent: Secondary | ICD-10-CM | POA: Insufficient documentation

## 2021-12-29 DIAGNOSIS — R1031 Right lower quadrant pain: Secondary | ICD-10-CM | POA: Insufficient documentation

## 2021-12-29 DIAGNOSIS — Z79899 Other long term (current) drug therapy: Secondary | ICD-10-CM | POA: Insufficient documentation

## 2021-12-29 LAB — CBC WITH DIFFERENTIAL/PLATELET
Abs Immature Granulocytes: 0.02 10*3/uL (ref 0.00–0.07)
Basophils Absolute: 0 10*3/uL (ref 0.0–0.1)
Basophils Relative: 1 %
Eosinophils Absolute: 0 10*3/uL (ref 0.0–0.5)
Eosinophils Relative: 0 %
HCT: 42.8 % (ref 39.0–52.0)
Hemoglobin: 14.4 g/dL (ref 13.0–17.0)
Immature Granulocytes: 0 %
Lymphocytes Relative: 36 %
Lymphs Abs: 2.5 10*3/uL (ref 0.7–4.0)
MCH: 32 pg (ref 26.0–34.0)
MCHC: 33.6 g/dL (ref 30.0–36.0)
MCV: 95.1 fL (ref 80.0–100.0)
Monocytes Absolute: 0.5 10*3/uL (ref 0.1–1.0)
Monocytes Relative: 7 %
Neutro Abs: 3.9 10*3/uL (ref 1.7–7.7)
Neutrophils Relative %: 56 %
Platelets: 365 10*3/uL (ref 150–400)
RBC: 4.5 MIL/uL (ref 4.22–5.81)
RDW: 15 % (ref 11.5–15.5)
WBC: 6.9 10*3/uL (ref 4.0–10.5)
nRBC: 0 % (ref 0.0–0.2)

## 2021-12-29 LAB — BASIC METABOLIC PANEL
Anion gap: 6 (ref 5–15)
BUN: 26 mg/dL — ABNORMAL HIGH (ref 8–23)
CO2: 22 mmol/L (ref 22–32)
Calcium: 8.6 mg/dL — ABNORMAL LOW (ref 8.9–10.3)
Chloride: 113 mmol/L — ABNORMAL HIGH (ref 98–111)
Creatinine, Ser: 1.29 mg/dL — ABNORMAL HIGH (ref 0.61–1.24)
GFR, Estimated: 59 mL/min — ABNORMAL LOW (ref >60)
Glucose, Bld: 102 mg/dL — ABNORMAL HIGH (ref 70–99)
Potassium: 3.9 mmol/L (ref 3.5–5.1)
Sodium: 141 mmol/L (ref 135–145)

## 2021-12-29 MED ORDER — ONDANSETRON HCL 4 MG/2ML IJ SOLN
4.0000 mg | Freq: Once | INTRAMUSCULAR | Status: DC
  Filled 2021-12-29: qty 2

## 2021-12-29 MED ORDER — HYDROMORPHONE HCL 1 MG/ML IJ SOLN
0.5000 mg | Freq: Once | INTRAMUSCULAR | Status: DC
  Filled 2021-12-29: qty 1

## 2021-12-29 NOTE — ED Provider Triage Note (Signed)
Emergency Medicine Provider Triage Evaluation Note ? ?Raymond Norris , a 71 y.o. male  was evaluated in triage.  Pt complains of sudden onset pain and swelling in his right groin.  This started about 2 hours ago.  He has never had any pain or swelling there before.  He has severe pain whenever the area is touched or he moves.. ? ?Review of Systems  ?Positive: Groin pain and swelling ?Negative: Fever ? ?Physical Exam  ?BP (!) 142/84 (BP Location: Left Arm)   Pulse 71   Temp 98.1 ?F (36.7 ?C) (Oral)   Resp 18   Ht 5' 8.5" (1.74 m)   Wt 98.9 kg   SpO2 97%   BMI 32.66 kg/m?  ?Gen:   Awake, no distress   ?Resp:  Normal effort  ?MSK:   Moves extremities without difficulty  ?Other:  Firm, exquisitely tender right inguinal hernia ? ?Medical Decision Making  ?Medically screening exam initiated at 3:28 PM.  Appropriate orders placed.  Raymond Norris was informed that the remainder of the evaluation will be completed by another provider, this initial triage assessment does not replace that evaluation, and the importance of remaining in the ED until their evaluation is complete. ? ?Orders placed, work-up initiated ?  ?Arthor Captain, PA-C ?12/29/21 1530 ? ?

## 2021-12-29 NOTE — Discharge Instructions (Signed)
Follow-up with Dr. Derrell Lolling or one of his colleagues at [facility] in the next couple weeks to discuss your inguinal hernia.  No heavy lifting.  Take Tylenol or Motrin for pain.  If the hernia will not go back in and you have significant discomfort you need to come to the hospital ?

## 2021-12-29 NOTE — ED Triage Notes (Signed)
Patient complains of right lower abdominal pain under the waistband of his pants that started while driving to [place]. Patient states the pain improved after unbutton his pants. While talking about his complaint, patient decided he longer wanted to be evaluated and said that he believes the pain was caused by his pants being too tight. Patient ambulated out of the department independently with a steady gait and with no complaints. ?

## 2021-12-29 NOTE — ED Provider Notes (Signed)
?[facility]-EMERGENCY DEPT ?Provider Note ? ? ?CSN: [id number] ?Arrival date & time: 12/29/21  1516 ? ?  ? ?History ? ?Chief Complaint  ?Patient presents with  ? Groin Swelling  ? ? ?Raymond Norris is a 71 y.o. male. ? ?Patient complains of right inguinal discomfort.  And a protrusion occasionally.  Patient has a history of hypertension and hyperlipidemia ? ?The history is provided by the patient and medical records. No language interpreter was used.  ?Abdominal Pain ?Pain location:  RLQ ?Pain quality: aching   ?Pain radiates to:  Does not radiate ?Pain severity:  Mild ?Onset quality:  Sudden ?Timing:  Intermittent ?Progression:  Resolved ?Chronicity:  New ?Context: not alcohol use   ?Relieved by:  Nothing ?Worsened by:  Nothing ?Ineffective treatments:  None tried ?Associated symptoms: no anorexia, no chest pain, no cough, no diarrhea, no fatigue and no hematuria   ? ?  ? ?Home Medications ?Prior to Admission medications   ?Medication Sig Start Date End Date Taking? Authorizing Provider  ?amLODipine (NORVASC) 5 MG tablet TAKE 1 TABLET BY MOUTH DAILY 12/11/21   Lewayne Bunting, MD  ?atorvastatin (LIPITOR) 80 MG tablet TAKE 1 TABLET BY MOUTH DAILY 11/15/21   Lewayne Bunting, MD  ?fluticasone (FLONASE) 50 MCG/ACT nasal spray 2 sprays each nost qd x 1 month, then 1 spry q nost qd 12/10/19   Terressa Koyanagi, DO  ?nitroGLYCERIN (NITROSTAT) 0.4 MG SL tablet Place 1 tablet (0.4 mg total) under the tongue every 5 (five) minutes x 3 doses as needed for chest pain. 01/27/19   Ronney Asters, NP  ?   ? ?Allergies    ?Patient has no known allergies.   ? ?Review of Systems   ?Review of Systems  ?Constitutional:  Negative for appetite change and fatigue.  ?HENT:  Negative for congestion, ear discharge and sinus pressure.   ?Eyes:  Negative for discharge.  ?Respiratory:  Negative for cough.   ?Cardiovascular:  Negative for chest pain.  ?Gastrointestinal:  Positive for abdominal pain. Negative for anorexia and  diarrhea.  ?Genitourinary:  Negative for frequency and hematuria.  ?Musculoskeletal:  Negative for back pain.  ?Skin:  Negative for rash.  ?Neurological:  Negative for seizures and headaches.  ?Psychiatric/Behavioral:  Negative for hallucinations.   ? ?Physical Exam ?Updated Vital Signs ?BP 131/78   Pulse 67   Temp 98.1 ?F (36.7 ?C) (Oral)   Resp 15   Ht 5' 8.5" (1.74 m)   Wt 98.9 kg   SpO2 99%   BMI 32.66 kg/m?  ?Physical Exam ?Vitals and nursing note reviewed.  ?Constitutional:   ?   Appearance: He is well-developed.  ?HENT:  ?   Head: Normocephalic.  ?   Nose: Nose normal.  ?Eyes:  ?   General: No scleral icterus. ?   Conjunctiva/sclera: Conjunctivae normal.  ?Neck:  ?   Thyroid: No thyromegaly.  ?Cardiovascular:  ?   Rate and Rhythm: Normal rate and regular rhythm.  ?   Heart sounds: No murmur heard. ?  No friction rub. No gallop.  ?Pulmonary:  ?   Breath sounds: No stridor. No wheezing or rales.  ?Chest:  ?   Chest wall: No tenderness.  ?Abdominal:  ?   General: There is no distension.  ?   Tenderness: There is no abdominal tenderness. There is no rebound.  ?Genitourinary: ?   Comments: Patient has right inguinal hernia that has been reduced. ?Musculoskeletal:     ?   General:  Normal range of motion.  ?   Cervical back: Neck supple.  ?Lymphadenopathy:  ?   Cervical: No cervical adenopathy.  ?Skin: ?   Findings: No erythema or rash.  ?Neurological:  ?   Mental Status: He is alert and oriented to person, place, and time.  ?   Motor: No abnormal muscle tone.  ?   Coordination: Coordination normal.  ?Psychiatric:     ?   Behavior: Behavior normal.  ? ? ?ED Results / Procedures / Treatments   ?Labs ?(all labs ordered are listed, but only abnormal results are displayed) ?Labs Reviewed  ?BASIC METABOLIC PANEL - Abnormal; Notable for the following components:  ?    Result Value  ? Chloride 113 (*)   ? Glucose, Bld 102 (*)   ? BUN 26 (*)   ? Creatinine, Ser 1.29 (*)   ? Calcium 8.6 (*)   ? GFR, Estimated 59 (*)    ? All other components within normal limits  ?CBC WITH DIFFERENTIAL/PLATELET  ? ? ?EKG ?None ? ?Radiology ?No results found. ? ?Procedures ?Procedures  ? ? ?Medications Ordered in ED ?Medications - No data to display ? ?ED Course/ Medical Decision Making/ A&P ?  ?                        ?Medical Decision Making ?This patient presents to the ED for concern of inguinal pain, this involves an extensive number of treatment options, and is a complaint that carries with it a high risk of complications and morbidity.  The differential diagnosis includes appendicitis inguinal hernia ? ? ?Co morbidities that complicate the patient evaluation ? ?Hyperlipidemia hypertension ? ? ?Additional history obtained: ? ?Additional history obtained from patient ?External records from outside source obtained and reviewed including hospital record ? ? ?Lab Tests: ? ?I Ordered, and personally interpreted labs.  The pertinent results include: CBC and chemistries show elevated BUN and creatinine.  CBC ? ? ?Imaging Studies ordered: ? ?No active ? ?Cardiac Monitoring: / EKG: ? ?The patient was maintained on a cardiac monitor.  I personally viewed and interpreted the cardiac monitored which showed an underlying rhythm of: Normal sinus rhythm ? ? ?Consultations Obtained: ? ?No consult ? ?Problem List / ED Course / Critical interventions / Medication management ? ?Hypertension and inguinal hernia no medicines given   ?Reevaluation of the patient after these medicines showed that the patient stayed the same ?I have reviewed the patients home medicines and have made adjustments as needed ? ? ?Social Determinants of Health: ? ?None ? ? ?Test / Admission - Considered: ? ?None ? ?Patient with reducible right inguinal hernia.  He is told to take Tylenol or Motrin and follow-up with general surgery.  He is to return if his hernia becomes painful or he cannot reduce it ? ? ? ? ? ? ? ?Final Clinical Impression(s) / ED Diagnoses ?Final diagnoses:  ?Right  inguinal hernia  ? ? ?Rx / DC Orders ?ED Discharge Orders   ? ? None  ? ?  ? ? ?  ?Bethann Berkshire, MD ?12/31/21 1230 ? ?

## 2021-12-29 NOTE — ED Triage Notes (Signed)
Patient states he had a sudden swelling of his right groin. Patient reports tht he has pain in the area as well. ?

## 2022-01-15 ENCOUNTER — Other Ambulatory Visit: Payer: Self-pay | Admitting: Cardiology

## 2022-07-17 ENCOUNTER — Encounter: Payer: Self-pay | Admitting: Internal Medicine

## 2022-07-17 ENCOUNTER — Ambulatory Visit (INDEPENDENT_AMBULATORY_CARE_PROVIDER_SITE_OTHER): Payer: 59 | Admitting: Internal Medicine

## 2022-07-17 VITALS — BP 124/84 | HR 64 | Temp 97.5°F | Wt 215.0 lb

## 2022-07-17 DIAGNOSIS — R059 Cough, unspecified: Secondary | ICD-10-CM | POA: Diagnosis not present

## 2022-07-17 DIAGNOSIS — J40 Bronchitis, not specified as acute or chronic: Secondary | ICD-10-CM

## 2022-07-17 MED ORDER — AZITHROMYCIN 250 MG PO TABS: ORAL_TABLET | ORAL | 0 refills | Status: AC

## 2022-07-17 MED ORDER — BENZONATATE 100 MG PO CAPS: 100.0000 mg | ORAL_CAPSULE | Freq: Two times a day (BID) | ORAL | 0 refills | Status: DC | PRN

## 2022-07-17 NOTE — Patient Instructions (Signed)
-  Nice seeing you today!!  -Start azithromycin once a day. Tessalon perles as needed for cough up to twice a day. Would also recommended mucinex twice a day.

## 2022-07-17 NOTE — Progress Notes (Signed)
Acute office Visit     CC/Reason for Visit: Cough  HPI: Raymond Norris is a 71 y.o. male who is coming in today for the above mentioned reasons.  He has been experiencing a cough for the past 3 weeks.  It started out as a typical URI with runny nose, congestion, postnasal drip.  It has now moved onto his chest where he is having significant coughing and yellowish sputum production.  He denies fevers or myalgias, no recent travel or sick contacts.   Past Medical/Surgical History: Past Medical History:  Diagnosis Date   Coronary artery disease    GERD 04/26/2008   Myocardial infarction [facility])     Past Surgical History:  Procedure Laterality Date   CARDIAC CATHETERIZATION N/A 01/31/2015   Procedure: Left Heart Cath and Coronary Angiography;  Surgeon: Tonny Bollman, MD;  Location: [facility] INVASIVE CV LAB;  Service: Cardiovascular;  Laterality: N/A;   PERCUTANEOUS CORONARY STENT INTERVENTION (PCI-S)  01/2015   LEFT CIRCUMFLEX  1ST & 2ND   [facility]    Social History:  reports that he has never smoked. He has never used smokeless tobacco. He reports that he does not drink alcohol and does not use drugs.  Allergies: No Known Allergies  Family History:  Family History  Family history unknown: Yes     Current Outpatient Medications:    amLODipine (NORVASC) 5 MG tablet, TAKE 1 TABLET BY MOUTH DAILY, Disp: 90 tablet, Rfl: 3   atorvastatin (LIPITOR) 80 MG tablet, Take 1 tablet (80 mg total) by mouth daily. TAKE 1 TABLET BY MOUTH DAILY, Disp: 90 tablet, Rfl: 3   azithromycin (ZITHROMAX) 250 MG tablet, Take 2 tablets on day 1, then 1 tablet daily on days 2 through 5, Disp: 6 tablet, Rfl: 0   benzonatate (TESSALON) 100 MG capsule, Take 1 capsule (100 mg total) by mouth 2 (two) times daily as needed for cough., Disp: 20 capsule, Rfl: 0   fluticasone (FLONASE) 50 MCG/ACT nasal spray, 2 sprays each nost qd x 1 month, then 1 spry q nost qd, Disp: 16 g, Rfl: 1   nitroGLYCERIN (NITROSTAT)  0.4 MG SL tablet, Place 1 tablet (0.4 mg total) under the tongue every 5 (five) minutes x 3 doses as needed for chest pain., Disp: 25 tablet, Rfl: 12  Review of Systems:  Constitutional: Denies fever, chills, diaphoresis, appetite change and fatigue.  HEENT: Denies photophobia, eye pain, redness, hearing loss,  mouth sores, trouble swallowing, neck pain, neck stiffness and tinnitus.   Respiratory: Denies SOB, DOE, cough, chest tightness,  and wheezing.   Cardiovascular: Denies chest pain, palpitations and leg swelling.  Gastrointestinal: Denies nausea, vomiting, abdominal pain, diarrhea, constipation, blood in stool and abdominal distention.  Genitourinary: Denies dysuria, urgency, frequency, hematuria, flank pain and difficulty urinating.  Endocrine: Denies: hot or cold intolerance, sweats, changes in hair or nails, polyuria, polydipsia. Musculoskeletal: Denies myalgias, back pain, joint swelling, arthralgias and gait problem.  Skin: Denies pallor, rash and wound.  Neurological: Denies dizziness, seizures, syncope, weakness, light-headedness, numbness and headaches.  Hematological: Denies adenopathy. Easy bruising, personal or family bleeding history  Psychiatric/Behavioral: Denies suicidal ideation, mood changes, confusion, nervousness, sleep disturbance and agitation    Physical Exam: Vitals:   07/17/22 1303  BP: 124/84  Pulse: 64  Temp: (!) 97.5 F (36.4 C)  TempSrc: Oral  SpO2: 98%  Weight: 215 lb (97.5 kg)    Body mass index is 32.22 kg/m.   Constitutional: NAD, calm, comfortable Eyes: PERRL,  lids and conjunctivae normal ENMT: Mucous membranes are moist. Posterior pharynx is erythematous but clear of any exudate or lesions.  Tympanic membrane is pearly white, no erythema or bulging. Respiratory: Bilateral Rales, no wheezing, no crackles. Normal respiratory effort. No accessory muscle use.  Cardiovascular: Regular rate and rhythm, no murmurs / rubs / gallops. No extremity  edema.  Psychiatric: Normal judgment and insight. Alert and oriented x 3. Normal mood.    Impression and Plan:  Bronchitis - Plan: benzonatate (TESSALON) 100 MG capsule, azithromycin (ZITHROMAX) 250 MG tablet  Cough, unspecified type  -Suspect acute bronchitis given duration of symptoms. -Start azithromycin, Tessalon Perles as needed.  Have also advised over-the-counter treatments such as pain relievers as needed and Mucinex.  Time spent:31 minutes reviewing chart, interviewing and examining patient and formulating plan of care.   Patient Instructions  -Nice seeing you today!!  -Start azithromycin once a day. Tessalon perles as needed for cough up to twice a day. Would also recommended mucinex twice a day.    Lelon Frohlich, MD [facility]

## 2022-08-14 ENCOUNTER — Telehealth: Payer: Self-pay | Admitting: Internal Medicine

## 2022-08-14 MED ORDER — FLUTICASONE PROPIONATE 50 MCG/ACT NA SUSP: NASAL | 1 refills | Status: DC

## 2022-08-14 NOTE — Telephone Encounter (Signed)
Refill sent.

## 2022-08-14 NOTE — Telephone Encounter (Signed)
Refill fluticasone (FLONASE) 50 MCG/ACT nasal spray  [id number] - [place], [place] - [address] AT [facility] OF [address] Phone: [phone]  Fax: [phone]

## 2022-12-06 ENCOUNTER — Other Ambulatory Visit: Payer: Self-pay | Admitting: Cardiology

## 2023-01-03 ENCOUNTER — Ambulatory Visit: Payer: 59 | Admitting: Family Medicine

## 2023-01-03 ENCOUNTER — Encounter: Payer: Self-pay | Admitting: Family Medicine

## 2023-01-03 ENCOUNTER — Other Ambulatory Visit: Payer: Self-pay | Admitting: Cardiology

## 2023-01-03 VITALS — BP 116/82 | HR 60 | Temp 98.6°F | Wt 222.0 lb

## 2023-01-03 DIAGNOSIS — M7732 Calcaneal spur, left foot: Secondary | ICD-10-CM | POA: Diagnosis not present

## 2023-01-03 DIAGNOSIS — M79672 Pain in left foot: Secondary | ICD-10-CM

## 2023-01-03 NOTE — Progress Notes (Signed)
   Established Patient Office Visit   Subjective  Patient ID: Raymond Norris, male    DOB: 1951-01-22  Age: 72 y.o. MRN: [id number]  Chief Complaint  Patient presents with   Pain    Foot left foot pain, thinks it may be heel spurs like Dow Adolph, hurts when steps on heel.     Patient is a 72 year old male with pmh sig for history of NSTEMI, HTN, CAD, GERD, HLD followed by Dr. Ardyth Harps and seen for ongoing concern.  Patient with left heel pain.  Worse with stepping/standing.  Patient notes history basketball at least 3 times per week up until recently when pain started.  Patient denies pain when sitting or laying down.  Denies swelling in foot or pain/tightness along the bottom of foot.      ROS Negative unless stated above    Objective:     BP 116/82 (BP Location: Right Arm, Patient Position: Sitting, Cuff Size: Normal)   Pulse 60   Temp 98.6 F (37 C) (Oral)   Wt 222 lb (100.7 kg)   SpO2 93%   BMI 33.26 kg/m    Physical Exam Constitutional:      Appearance: Normal appearance.  HENT:     Head: Normocephalic and atraumatic.     Nose: Nose normal.     Mouth/Throat:     Mouth: Mucous membranes are moist.  Musculoskeletal:     Right foot: Normal.     Left foot: Bony tenderness present.     Comments: TTP of central plantar surface of left heel.   Neurological:     Mental Status: He is alert.    No results found for any visits on 01/03/23.    Assessment & Plan:  Pain of left heel  Heel spur, left  Pain of left heel likely 2/2 heel spur.  Also consider fat pad atrophy.  Discussed supportive care including heel cups/insole for support.  Rest, Tylenol, ice, etc.  For continued or worsening symptoms obtain x-ray/consider steroid injection.  Return if symptoms worsen or fail to improve.   Deeann Saint, MD

## 2023-01-16 ENCOUNTER — Other Ambulatory Visit: Payer: Self-pay | Admitting: *Deleted

## 2023-01-16 MED ORDER — ATORVASTATIN CALCIUM 80 MG PO TABS: ORAL_TABLET | ORAL | 0 refills | Status: DC

## 2023-01-29 ENCOUNTER — Encounter (HOSPITAL_COMMUNITY): Payer: Self-pay

## 2023-01-29 ENCOUNTER — Other Ambulatory Visit: Payer: Self-pay

## 2023-01-29 ENCOUNTER — Emergency Department (HOSPITAL_COMMUNITY)
Admission: EM | Admit: 2023-01-29 | Discharge: 2023-01-29 | Disposition: A | Discharge status: Home / Self Care | Payer: 59 | Attending: Emergency Medicine | Admitting: Emergency Medicine

## 2023-01-29 DIAGNOSIS — K4090 Unilateral inguinal hernia, without obstruction or gangrene, not specified as recurrent: Secondary | ICD-10-CM | POA: Insufficient documentation

## 2023-01-29 NOTE — Discharge Instructions (Addendum)
Return for any problem.   Follow up with [facility] as instructed - your hernia needs to be repaired or it will slowly worsen.   Return sooner for pain, increased swelling to area, signs of incarceration, fever, vomiting, or other emergency.

## 2023-01-29 NOTE — ED Provider Notes (Signed)
[place] EMERGENCY DEPARTMENT AT [facility] Provider Note   CSN: [id number] Arrival date & time: 01/29/23  1420     History  Chief Complaint  Patient presents with   Inguinal Hernia    Raymond Norris is a 72 y.o. male.  72 year old male with prior medical history as detailed below presents for evaluation.  Patient with known history of right inguinal hernia.  Patient reports that he was diagnosed with this several months ago.  He has not yet followed up with general surgery for repair of same.  He reports that this afternoon he was having his monthly sexual intercourse with his wife when his hernia "popped out" more.  He feels that his wife may have "pulled on things" too much.   He denies pain.  He denies nausea.  He denies vomiting.  He denies fever.  He denies difficulty with urination.  The history is provided by the patient and medical records.       Home Medications Prior to Admission medications   Medication Sig Start Date End Date Taking? Authorizing Provider  amLODipine (NORVASC) 5 MG tablet TAKE 1 TABLET(5 MG) BY MOUTH DAILY. SCHEDULE ANNUAL APPOINTMENT FOR REFILLS 01/03/23   Lewayne Bunting, MD  atorvastatin (LIPITOR) 80 MG tablet TAKE 1 TABLET BY MOUTH DAILY. 2nd attempt. Patient needs to schedule yearly appt for any future refills or # 90 day supply. 01/16/23   Lewayne Bunting, MD  benzonatate (TESSALON) 100 MG capsule Take 1 capsule (100 mg total) by mouth 2 (two) times daily as needed for cough. 07/17/22   Philip Aspen, Limmie Patricia, MD  fluticasone [facility]) 50 MCG/ACT nasal spray 2 sprays each nost qd x 1 month, then 1 spry q nost qd 08/14/22   Philip Aspen, Limmie Patricia, MD  nitroGLYCERIN (NITROSTAT) 0.4 MG SL tablet Place 1 tablet (0.4 mg total) under the tongue every 5 (five) minutes x 3 doses as needed for chest pain. 01/27/19   Ronney Asters, NP      Allergies    Patient has no known allergies.    Review of Systems   Review of Systems   All other systems reviewed and are negative.   Physical Exam Updated Vital Signs BP (!) 146/82 (BP Location: Left Arm)   Pulse 98   Temp 98.6 F (37 C) (Oral)   Resp 18   Ht 5\' 8"  (1.727 m)   Wt 101.2 kg   SpO2 97%   BMI 33.91 kg/m  Physical Exam Vitals and nursing note reviewed.  Constitutional:      General: He is not in acute distress.    Appearance: Normal appearance. He is well-developed.  HENT:     Head: Normocephalic and atraumatic.  Eyes:     Conjunctiva/sclera: Conjunctivae normal.     Pupils: Pupils are equal, round, and reactive to light.  Cardiovascular:     Rate and Rhythm: Normal rate and regular rhythm.     Heart sounds: Normal heart sounds.  Pulmonary:     Effort: Pulmonary effort is normal. No respiratory distress.     Breath sounds: Normal breath sounds.  Abdominal:     General: There is no distension.     Palpations: Abdomen is soft.     Tenderness: There is no abdominal tenderness.  Genitourinary:    Comments: Right inguinal hernia present.  Hernia is easily reducible.  No evidence of incarceration.  No appreciable tenderness on exam.  GU exam otherwise without significant abnormality. Musculoskeletal:  General: No deformity. Normal range of motion.     Cervical back: Normal range of motion and neck supple.  Skin:    General: Skin is warm and dry.  Neurological:     General: No focal deficit present.     Mental Status: He is alert and oriented to person, place, and time.     ED Results / Procedures / Treatments   Labs (all labs ordered are listed, but only abnormal results are displayed) Labs Reviewed - No data to display  EKG None  Radiology No results found.  Procedures Procedures    Medications Ordered in ED Medications - No data to display  ED Course/ Medical Decision Making/ A&P                             Medical Decision Making   Medical Screen Complete  This patient presented to the ED with complaint of  inguinal hernia.  This complaint involves an extensive number of treatment options. The initial differential diagnosis includes, but is not limited to, inguinal hernia  This presentation is: Acute, Chronic, Self-Limited, Previously Undiagnosed, and Uncertain Prognosis  Patient with longstanding history of inguinal hernia.  Patient reports that this is somewhat more evident after recent intercourse with wife.  Patient's exam does not demonstrate evidence of incarceration.  Patient's right inguinal hernia is easily reducible and nontender.  Patient is advised that close follow-up with surgery would be appropriate.  Patient would benefit from surgical repair of his inguinal hernia.  He does not require further emergent evaluation or treatment.  Importance of close follow stressed.  Strict return precautions given and understood.  Basics of management of his inguinal hernia explained in detail.   Additional history obtained: External records from outside sources obtained and reviewed including prior ED visits and prior Inpatient records.    Problem List / ED Course:  Right inguinal hernia   Reevaluation:  After the interventions noted above, I reevaluated the patient and found that they have: stayed the same  Disposition:  After consideration of the diagnostic results and the patients response to treatment, I feel that the patent would benefit from close outpatient followup.           Final Clinical Impression(s) / ED Diagnoses Final diagnoses:  Right inguinal hernia    Rx / DC Orders ED Discharge Orders     None         Wynetta Fines, MD 01/29/23 1523

## 2023-01-29 NOTE — ED Triage Notes (Signed)
Patient reports hernia to right inguinal. Patient reports he was having sexual activity when hernia popped out

## 2023-01-30 ENCOUNTER — Other Ambulatory Visit: Payer: Self-pay | Admitting: Cardiology

## 2023-01-31 ENCOUNTER — Ambulatory Visit (INDEPENDENT_AMBULATORY_CARE_PROVIDER_SITE_OTHER): Payer: 59 | Admitting: Internal Medicine

## 2023-01-31 ENCOUNTER — Encounter: Payer: Self-pay | Admitting: Internal Medicine

## 2023-01-31 VITALS — BP 120/84 | HR 62 | Temp 98.1°F | Wt 219.9 lb

## 2023-01-31 DIAGNOSIS — K4090 Unilateral inguinal hernia, without obstruction or gangrene, not specified as recurrent: Secondary | ICD-10-CM

## 2023-01-31 DIAGNOSIS — Z09 Encounter for follow-up examination after completed treatment for conditions other than malignant neoplasm: Secondary | ICD-10-CM

## 2023-01-31 NOTE — Progress Notes (Signed)
     Established Patient Office Visit     CC/Reason for Visit: ED follow-up, right inguinal hernia  HPI: Raymond Norris is a 72 y.o. male who is coming in today for the above mentioned reasons.  He is known to have a right inguinal hernia.  In the past that has been easily reducible and is not caused any discomfort.  2 days ago, while having sexual intercourse, he had significant pain of the right inguinal area that had him go to the ED.  It was determined to be an inguinal hernia and he was advised to follow-up outpatient with general surgery.  Pain has since resolved.   Past Medical/Surgical History: Past Medical History:  Diagnosis Date   Coronary artery disease    GERD 04/26/2008   Myocardial infarction [facility])     Past Surgical History:  Procedure Laterality Date   CARDIAC CATHETERIZATION N/A 01/31/2015   Procedure: Left Heart Cath and Coronary Angiography;  Surgeon: Tonny Bollman, MD;  Location: [facility] INVASIVE CV LAB;  Service: Cardiovascular;  Laterality: N/A;   PERCUTANEOUS CORONARY STENT INTERVENTION (PCI-S)  01/2015   LEFT CIRCUMFLEX  1ST & 2ND   OM BRANCHES    Social History:  reports that he has never smoked. He has never used smokeless tobacco. He reports that he does not drink alcohol and does not use drugs.  Allergies: No Known Allergies  Family History:  Family History  Family history unknown: Yes     Current Outpatient Medications:    amLODipine (NORVASC) 5 MG tablet, TAKE 1 TABLET(5 MG) BY MOUTH DAILY. SCHEDULE ANNUAL APPOINTMENT FOR REFILLS, Disp: 30 tablet, Rfl: 0   atorvastatin (LIPITOR) 80 MG tablet, TAKE 1 TABLET BY MOUTH DAILY. 2ND ATTEMPT PATIENT NEEDS TO SCHEDULE YEARLY APPOINTMENT FOR ANY FUTURE REFILLS OR 90 DAY SUPPLY, Disp: 30 tablet, Rfl: 0   benzonatate (TESSALON) 100 MG capsule, Take 1 capsule (100 mg total) by mouth 2 (two) times daily as needed for cough., Disp: 20 capsule, Rfl: 0   fluticasone (FLONASE) 50 MCG/ACT nasal spray, 2 sprays each  nost qd x 1 month, then 1 spry q nost qd, Disp: 16 g, Rfl: 1   nitroGLYCERIN (NITROSTAT) 0.4 MG SL tablet, Place 1 tablet (0.4 mg total) under the tongue every 5 (five) minutes x 3 doses as needed for chest pain., Disp: 25 tablet, Rfl: 12  Review of Systems:  Negative unless indicated in HPI.   Physical Exam: Vitals:   01/31/23 1005  BP: 120/84  Pulse: 62  Temp: 98.1 F (36.7 C)  TempSrc: Oral  SpO2: 99%  Weight: 219 lb 14.4 oz (99.7 kg)    Body mass index is 33.44 kg/m.   Physical Exam Genitourinary:    Comments: Small right inguinal hernia noted. Not easily reducible.     Impression and Plan:  Hospital discharge follow-up  Right inguinal hernia -     Ambulatory referral to General Surgery   -ED charts reviewed in detail. -He is known to have a right inguinal hernia.  I will refer back to general surgery as it looks like it may be time for repair.  Time spent:22 minutes reviewing chart, interviewing and examining patient and formulating plan of care.     Chaya Jan, MD [place] [facility] at [facility]

## 2023-02-06 ENCOUNTER — Other Ambulatory Visit: Payer: Self-pay | Admitting: Cardiology

## 2023-02-20 NOTE — Progress Notes (Signed)
Cardiology Clinic Note   Patient Name: Raymond Norris Date of Encounter: 02/22/2023  Primary Care Provider:  Philip Norris, Raymond Patricia, MD Primary Cardiologist:  Raymond Millers, MD  Patient Profile    72 year old male with known history of coronary artery disease, drug-eluting stent to second marginal and first marginal in 2016, there was nonobstructive disease in the right coronary artery LAD, hypertension, and hyperlipidemia.  Last seen by Dr. Jens Norris on 10/24/2021, with with continuation of current medical therapy.  Past Medical History    Past Medical History:  Diagnosis Date   Coronary artery disease    GERD 04/26/2008   Myocardial infarction [facility])    Past Surgical History:  Procedure Laterality Date   CARDIAC CATHETERIZATION N/A 01/31/2015   Procedure: Left Heart Cath and Coronary Angiography;  Surgeon: Raymond Bollman, MD;  Location: [facility] INVASIVE CV LAB;  Service: Cardiovascular;  Laterality: N/A;   PERCUTANEOUS CORONARY STENT INTERVENTION (PCI-S)  01/2015   LEFT CIRCUMFLEX  1ST & 2ND   OM BRANCHES    Allergies  No Known Allergies  History of Present Illness    Raymond Norris comes today without any complaints.  He denies chest pain shortness of breath dizziness or fatigue.  He is retired and likes to work on cars and has not had any problems symptoms.He is medically compliant.   Home Medications    Current Outpatient Medications  Medication Sig Dispense Refill   aspirin EC 81 MG tablet Take 81 mg by mouth daily. Swallow whole.     fluticasone (FLONASE) 50 MCG/ACT nasal spray 2 sprays each nost qd x 1 month, then 1 spry q nost qd 16 g 1   amLODipine (NORVASC) 5 MG tablet TAKE 1 TABLET BY MOUTH EVERY DAY. 90 tablet 3   atorvastatin (LIPITOR) 80 MG tablet TAKE 1 TABLET BY MOUTH DAILY. 90 tablet 3   benzonatate (TESSALON) 100 MG capsule Take 1 capsule (100 mg total) by mouth 2 (two) times daily as needed for cough. (Patient not taking: Reported on 02/22/2023) 20 capsule 0    nitroGLYCERIN (NITROSTAT) 0.4 MG SL tablet Place 1 tablet (0.4 mg total) under the tongue every 5 (five) minutes x 3 doses as needed for chest pain. (Patient not taking: Reported on 02/22/2023) 25 tablet 12   No current facility-administered medications for this visit.     Family History    Family History  Family history unknown: Yes   He indicated that his mother is deceased. He indicated that his father is deceased.  Social History    Social History   Socioeconomic History   Marital status: Married    Spouse name: Not on file   Number of children: Not on file   Years of education: Not on file   Highest education level: Not on file  Occupational History   Not on file  Tobacco Use   Smoking status: Never   Smokeless tobacco: Never  Vaping Use   Vaping Use: Never used  Substance and Sexual Activity   Alcohol use: No   Drug use: No   Sexual activity: Not on file  Other Topics Concern   Not on file  Social History Narrative   Not on file   Social Determinants of Health   Financial Resource Strain: Not on file  Food Insecurity: Not on file  Transportation Needs: Not on file  Physical Activity: Not on file  Stress: Not on file  Social Connections: Not on file  Intimate Partner Violence: Not on file  Review of Systems    General:  No chills, fever, night sweats or weight changes.  Cardiovascular:  No chest pain, dyspnea on exertion, edema, orthopnea, palpitations, paroxysmal nocturnal dyspnea. Dermatological: No rash, lesions/masses Respiratory: No cough, dyspnea Urologic: No hematuria, dysuria Abdominal:   No nausea, vomiting, diarrhea, bright red blood per rectum, melena, or hematemesis Neurologic:  No visual changes, wkns, changes in mental status. All other systems reviewed and are otherwise negative except as noted above.     Physical Exam    VS:  BP 126/86   Pulse 71   Ht 5\' 8"  (1.727 m)   Wt 219 lb 12.8 oz (99.7 kg)   SpO2 96%   BMI 33.42 kg/m   , BMI Body mass index is 33.42 kg/m.     GEN: Well nourished, well developed, in no acute distress. HEENT: normal. Neck: Supple, no JVD, left carotid bruits, non on the right, mild abdominal bruit no masses. Cardiac: RRR, no murmurs, rubs, or gallops. No clubbing, cyanosis, edema.  Radials/DP/PT 2+ and equal bilaterally.  Respiratory:  Respirations regular and unlabored, clear to auscultation bilaterally. GI: Soft, nontender, nondistended, BS + x 4. MS: no deformity or atrophy. Skin: warm and dry, no rash. Neuro:  Strength and sensation are intact. Psych: Normal affect.  Accessory Clinical Findings      Lab Results  Component Value Date   WBC 6.9 12/29/2021   HGB 14.4 12/29/2021   HCT 42.8 12/29/2021   MCV 95.1 12/29/2021   PLT 365 12/29/2021   Lab Results  Component Value Date   CREATININE 1.29 (H) 12/29/2021   BUN 26 (H) 12/29/2021   NA 141 12/29/2021   K 3.9 12/29/2021   CL 113 (H) 12/29/2021   CO2 22 12/29/2021   Lab Results  Component Value Date   ALT 22 11/22/2021   AST 19 11/22/2021   ALKPHOS 51 11/22/2021   BILITOT 1.1 11/22/2021   Lab Results  Component Value Date   CHOL 95 (L) 11/22/2021   HDL 38 (L) 11/22/2021   LDLCALC 42 11/22/2021   TRIG 69 11/22/2021   CHOLHDL 2.5 11/22/2021    Lab Results  Component Value Date   HGBA1C 5.5 01/31/2015    Review of Prior Studies Echocardiogram 02/19/2019 Nuclear stress EF: 46%. The left ventricular ejection fraction is mildly decreased (45-54%). No T wave inversion was noted during stress. There was no ST segment deviation noted during stress. Defect 1: There is a small defect of mild severity present in the basal inferior location. This is an intermediate risk study.   Small size, mild intensity fixed basal to mid inferior wall perfusion defect, may represent artifact or scar. No reversible ischemia. LVEF 46% with basal to mid inferior hypokinesis. This is an intermediate risk study.  LHC 01/31/2015    2nd  Mrg lesion, 100% stenosed. There is a 0% residual stenosis post intervention. A drug-eluting stent was placed. 1st Mrg lesion, 90% stenosed. There is a 0% residual stenosis post intervention. A drug-eluting stent was placed.   FINAL CONCLUSIONS: SEVERE SINGLE VESSEL CAD INVOLVING MULTIPLE BRANCHES OF THE LEFT CIRCUMFLEX WITH SUCCESSFUL PCI OF THE FIRST AND SECOND OM BRANCHES MILD DIFFUSE NONOBSTRUCTIVE DISEASE INVOLVING THE RCA AND LAD  MODERATE DIAGONAL BRANCH STENOSIS MILD SEGMENTAL LV CONTRACTION ABNORMALITY   RECOMMENDATIONS: CONTINUE AGGRESSIVE MEDICAL MANAGEMENT DAPT WITH ASA AND BRILINTA X 12 MONTHS MINIMUM  Assessment & Plan   1.  Hypertension: Excellent control blood pressure on amlodipine 5 mg daily.  Refills are provided.  2.  Hypercholesterolemia: Continue 80 mg daily.  He will need fasting lipids LFTs and will have these drawn next week as it is late in the day.  Goal of LDL to keep it less than 100.         Signed, Bettey Mare. Liborio Nixon, ANP, AACC   02/22/2023 5:08 PM      Office [phone] Fax [phone]  Notice: This dictation was prepared with Dragon dictation along with smaller phrase technology. Any transcriptional errors that result from this process are unintentional and may not be corrected upon review.

## 2023-02-22 ENCOUNTER — Encounter: Payer: Self-pay | Admitting: Adult Health

## 2023-02-22 ENCOUNTER — Ambulatory Visit: Payer: 59 | Attending: Adult Health | Admitting: Adult Health

## 2023-02-22 VITALS — BP 126/86 | HR 71 | Ht 68.0 in | Wt 219.8 lb

## 2023-02-22 DIAGNOSIS — I10 Essential (primary) hypertension: Secondary | ICD-10-CM

## 2023-02-22 DIAGNOSIS — E7800 Pure hypercholesterolemia, unspecified: Secondary | ICD-10-CM | POA: Diagnosis not present

## 2023-02-22 MED ORDER — ATORVASTATIN CALCIUM 80 MG PO TABS: ORAL_TABLET | ORAL | 3 refills | Status: DC

## 2023-02-22 MED ORDER — AMLODIPINE BESYLATE 5 MG PO TABS: ORAL_TABLET | ORAL | 3 refills | Status: DC

## 2023-02-22 NOTE — Patient Instructions (Signed)
Medication Instructions:  No Changes *If you need a refill on your cardiac medications before your next appointment, please call your pharmacy*   Lab Work: CBC, CMET, Lipid Panel If you have labs (blood work) drawn today and your tests are completely normal, you will receive your results only by: MyChart Message (if you have MyChart) OR A paper copy in the mail If you have any lab test that is abnormal or we need to change your treatment, we will call you to review the results.   Testing/Procedures: No Testing   Follow-Up: At [facility], you and your health needs are our priority.  As part of our continuing mission to provide you with exceptional heart care, we have created designated Provider Care Teams.  These Care Teams include your primary Cardiologist (physician) and Advanced Practice Providers (APPs -  Physician Assistants and Nurse Practitioners) who all work together to provide you with the care you need, when you need it.  We recommend signing up for the patient portal called "MyChart".  Sign up information is provided on this After Visit Summary.  MyChart is used to connect with patients for Virtual Visits (Telemedicine).  Patients are able to view lab/test results, encounter notes, upcoming appointments, etc.  Non-urgent messages can be sent to your provider as well.   To learn more about what you can do with MyChart, go to [email or web address].    Your next appointment:   6 month(s)  Provider:   Olga Millers, MD

## 2023-02-26 ENCOUNTER — Ambulatory Visit: Payer: Self-pay | Admitting: Surgery

## 2023-02-26 ENCOUNTER — Telehealth: Payer: Self-pay

## 2023-02-26 NOTE — Telephone Encounter (Signed)
   Patient Name: Raymond Norris  DOB: 1950/10/26 MRN: [id number]  Primary Cardiologist: Olga Millers, MD  Chart reviewed as part of pre-operative protocol coverage.   Regarding ASA therapy, we recommend continuation of ASA throughout the perioperative period.  However, if the surgeon feels that cessation of ASA is required in the perioperative period, it may be stopped 5-7 days prior to surgery with a plan to resume it as soon as felt to be feasible from a surgical standpoint in the post-operative period.    [name], Leodis Rains, NP 02/26/2023, 4:52 PM

## 2023-02-26 NOTE — Telephone Encounter (Signed)
   Pre-operative Risk Assessment    Patient Name: Raymond Norris  DOB: 03-18-1951 MRN: [id number]      Request for Surgical Clearance    Procedure:   HERNIA SURGERY  Date of Surgery:  Clearance TBD                                 Surgeon:  DR. Twana First Surgeon's Group or Practice Name:  [facility] Phone number:  [phone] Fax number:  [phone]   Type of Clearance Requested:   - Pharmacy:  Hold Aspirin NEED INSTRUCTIONS TO HOLD   Type of Anesthesia:  General    Additional requests/questions:    SignedMichaelle Copas   02/26/2023, 4:45 PM

## 2023-02-26 NOTE — H&P (Signed)
Raymond Norris [id number]   Referring Provider:  Philip Aspen, Est[place]*   Subjective   Chief Complaint: New Consultation     History of Present Illness:    72 year old man with history of CAD/MI s/p PCI stent 2016 (cardiologist is Dr. Olga Millers), GERD hypertension and hyperlipidemia, obesity who presents for evaluation of a right inguinal hernia.  He has known about this for quite some time (several months) and previously it had been easily reducible and without any pain.  Last month while having intercourse he had significant right inguinal pain that prompted him to go to the emergency department where a right inguinal hernia was noted and easily reducible. He has been cautious with lifting since that time and has been asymptomatic in the interim.   Review of Systems: A complete review of systems was obtained from the patient.  I have reviewed this information and discussed as appropriate with the patient.  See HPI as well for other ROS.   Medical History: Past Medical History:  Diagnosis Date   GERD (gastroesophageal reflux disease)     There is no problem list on file for this patient.   Past Surgical History:  Procedure Laterality Date   Cardiac catheterization  2016     No Known Allergies  Current Outpatient Medications on File Prior to Visit  Medication Sig Dispense Refill   amLODIPine (NORVASC) 5 MG tablet Take 1 tablet by mouth once daily     aspirin 81 MG EC tablet Take 81 mg by mouth once daily     atorvastatin (LIPITOR) 80 MG tablet Take 1 tablet by mouth once daily     cetirizine (ZYRTEC) 10 MG tablet Take 10 mg by mouth once daily     fluticasone propionate (FLONASE) 50 mcg/actuation nasal spray 2 sprays each nost qd x 1 month, then 1 spry q nost qd     No current facility-administered medications on file prior to visit.    History reviewed. No pertinent family history.   Social History   Tobacco Use  Smoking Status Never  Smokeless  Tobacco Never     Social History   Socioeconomic History   Marital status: Married  Tobacco Use   Smoking status: Never   Smokeless tobacco: Never  Substance and Sexual Activity   Alcohol use: Never   Drug use: Never    Objective:    Vitals:   02/26/23 1523  BP: 120/70  Pulse: 67  Temp: 36.7 C (98.1 F)  SpO2: 96%  Weight: 99.3 kg (219 lb)  Height: 174 cm (5' 8.5")  PainSc: 0-No pain  PainLoc: Abdomen    Body mass index is 32.81 kg/m.  Alert well-appearing Unlabored respirations Abdomen soft, nontender.  He has a partially reducible right inguinal hernia which is nontender, no overlying skin changes.  Assessment and Plan:  Diagnoses and all orders for this visit:  Non-recurrent unilateral inguinal hernia without obstruction or gangrene    We discussed the relevant anatomy and we discussed options for repair.  I recommend an open approach and went over the technique of the procedure.  Discussed risks of bleeding, infection, pain, scarring, injury to structures in the area including nerves, blood vessels, bowel, bladder, risk of chronic pain, hernia recurrence, risk of seroma or hematoma, urinary retention, and risks of general anesthesia including cardiovascular, pulmonary, and thromboembolic complications.  We discussed typical postop recovery, timeline, and activity limitations.  We also discussed the option of ongoing observation, with high rate of ultimately  returning for surgery and risk of increasing size/symptoms from the hernia as well as incarceration/strangulation and went over symptoms that should prompt him to seek emergency treatment.  Questions were come and answered to the patient's satisfaction. Patient wishes to proceed with scheduling. Will request cardiac clearance.     Domanique Huesman Carlye Grippe, MD

## 2023-03-01 ENCOUNTER — Other Ambulatory Visit: Payer: Self-pay | Admitting: Cardiology

## 2023-03-13 LAB — CBC
Hematocrit: 47.3 % (ref 37.5–51.0)
Hemoglobin: 15.6 g/dL (ref 13.0–17.7)
MCH: 30.6 pg (ref 26.6–33.0)
MCHC: 33 g/dL (ref 31.5–35.7)
MCV: 93 fL (ref 79–97)
Platelets: 349 10*3/uL (ref 150–450)
RBC: 5.09 x10E6/uL (ref 4.14–5.80)
RDW: 13.8 % (ref 11.6–15.4)
WBC: 7.2 10*3/uL (ref 3.4–10.8)

## 2023-03-13 LAB — LIPID PANEL
Chol/HDL Ratio: 2.3 ratio (ref 0.0–5.0)
Cholesterol, Total: 98 mg/dL — ABNORMAL LOW (ref 100–199)
HDL: 42 mg/dL (ref >39)
LDL Chol Calc (NIH): 42 mg/dL (ref 0–99)
Triglycerides: 60 mg/dL (ref 0–149)
VLDL Cholesterol Cal: 14 mg/dL (ref 5–40)

## 2023-03-13 LAB — COMPREHENSIVE METABOLIC PANEL
ALT: 19 IU/L (ref 0–44)
AST: 16 IU/L (ref 0–40)
Albumin: 4.2 g/dL (ref 3.8–4.8)
Alkaline Phosphatase: 53 IU/L (ref 44–121)
BUN/Creatinine Ratio: 11 (ref 10–24)
BUN: 15 mg/dL (ref 8–27)
Bilirubin Total: 0.8 mg/dL (ref 0.0–1.2)
CO2: 23 mmol/L (ref 20–29)
Calcium: 9.6 mg/dL (ref 8.6–10.2)
Chloride: 108 mmol/L — ABNORMAL HIGH (ref 96–106)
Creatinine, Ser: 1.41 mg/dL — ABNORMAL HIGH (ref 0.76–1.27)
Globulin, Total: 2.5 g/dL (ref 1.5–4.5)
Glucose: 102 mg/dL — ABNORMAL HIGH (ref 70–99)
Potassium: 4.6 mmol/L (ref 3.5–5.2)
Sodium: 143 mmol/L (ref 134–144)
Total Protein: 6.7 g/dL (ref 6.0–8.5)
eGFR: 53 mL/min/{1.73_m2} — ABNORMAL LOW (ref >59)

## 2023-03-18 ENCOUNTER — Telehealth: Payer: Self-pay

## 2023-03-18 NOTE — Telephone Encounter (Addendum)
Called patient regarding results. Left message. Letter mailed on 03/18/23----- Message from Joni Reining sent at 03/14/2023  7:11 AM EDT ----- I have reviewed his labs.  The cholesterol is very well controlled. His kidney function is a little strained. Should hydrate more.  No new testing is planned. Should follow up with PCP for continued medical issues. KL

## 2023-04-19 NOTE — Progress Notes (Signed)
COVID Vaccine received:  []  No [x]  Yes Date of any COVID positive Test in last 90 days: No PCP - Estela Philip Aspen MD Cardiologist - Olga Millers MD  Chest x-ray -  EKG -  02/22/23 Epic Stress Test - 02/19/19 EPIC ECHO -  Cardiac Cath - 01/31/15 EPIC  Bowel Prep - [x]  No  []   Yes ______  Pacemaker / ICD device [x]  No []  Yes   Spinal Cord Stimulator:[x]  No []  Yes       History of Sleep Apnea? [x]  No []  Yes   CPAP used?- Stop  No []  Yes    Does the patient monitor blood sugar?          [x]  No []  Yes  []  N/A  Patient has: [x]  NO Hx DM   []  Pre-DM                 []  DM1  []   DM2 Does patient have a [employer] or Dexacom? []  No []  Yes   Fasting Blood Sugar Ranges-  Checks Blood Sugar _____ times a day  GLP1 agonist / usual dose - No GLP1 instructions:  SGLT-2 inhibitors / usual dose - No SGLT-2 instructions:   Blood Thinner / Instructions: Aspirin Instructions:81 mg ASA. May be stopped 5-7 days prior to surgery per Cardio office. Last dose should be 04/26/23  Comments:   Activity level: Patient is able  to climb a flight of stairs without difficulty; []  No CP  [x]  No SOB, Patient can  perform ADLs without assistance.   Anesthesia review: CAD, MI 2016, HTN  Patient denies shortness of breath, fever, cough and chest pain at PAT appointment.  Patient verbalized understanding and agreement to the Pre-Surgical Instructions that were given to them at this PAT appointment. Patient was also educated of the need to review these PAT instructions again prior to his/her surgery.I reviewed the appropriate phone numbers to call if they have any and questions or concerns.

## 2023-04-19 NOTE — Patient Instructions (Signed)
SURGICAL WAITING ROOM VISITATION  Patients having surgery or a procedure may have no more than 2 support people in the waiting area - these visitors may rotate.    Children under the age of 65 must have an adult with them who is not the patient.  Due to an increase in RSV and influenza rates and associated hospitalizations, children ages 9 and under may not visit patients in [facility] hospitals.  If the patient needs to stay at the hospital during part of their recovery, the visitor guidelines for inpatient rooms apply. Pre-op nurse will coordinate an appropriate time for 1 support person to accompany patient in pre-op.  This support person may not rotate.    Please refer to the [facility] website for the visitor guidelines for Inpatients (after your surgery is over and you are in a regular room).       Your procedure is scheduled on: 05/01/23   Report to [facility] Main Entrance    Report to admitting at  12:45 PM   Call this number if you have problems the morning of surgery [phone]   Do not eat food :After Midnight.      Oral Hygiene is also important to reduce your risk of infection.                                    Remember - BRUSH YOUR TEETH THE MORNING OF SURGERY WITH YOUR REGULAR TOOTHPASTE  DENTURES WILL BE REMOVED PRIOR TO SURGERY PLEASE DO NOT APPLY "Poly grip" OR ADHESIVES!!!   Do NOT smoke after Midnight   Stop all vitamins and herbal supplements 7 days before surgery.   Take these medicines the morning of surgery with A SIP OF WATER:  Amlodipine, Atorvastatin             You may not have any metal on your body including hair pins, jewelry, and body piercing             Do not wear  lotions, powders,cologne, or deodorant              Men may shave face and neck.   Do not bring valuables to the hospital. [facility] IS NOT             RESPONSIBLE   FOR VALUABLES.   Contacts, glasses, dentures or bridgework may not be worn into  surgery.  DO NOT BRING YOUR HOME MEDICATIONS TO THE HOSPITAL. PHARMACY WILL DISPENSE MEDICATIONS LISTED ON YOUR MEDICATION LIST TO YOU DURING YOUR ADMISSION IN THE HOSPITAL!    Patients discharged on the day of surgery will not be allowed to drive home.  Someone NEEDS to stay with you for the first 24 hours after anesthesia.   Special Instructions: Bring a copy of your healthcare power of attorney and living will documents the day of surgery if you haven't scanned them before.              Please read over the following fact sheets you were given: IF YOU HAVE QUESTIONS ABOUT YOUR PRE-OP INSTRUCTIONS PLEASE CALL [phone] Raymond Norris   If you received a COVID test during your pre-op visit  it is requested that you wear a mask when out in public, stay away from anyone that may not be feeling well and notify your surgeon if you develop symptoms. If you test positive for Covid or have been in contact  with anyone that has tested positive in the last 10 days please notify you surgeon.    [facility] - Preparing for Surgery Before surgery, you can play an important role.  Because skin is not sterile, your skin needs to be as free of germs as possible.  You can reduce the number of germs on your skin by washing with CHG (chlorahexidine gluconate) soap before surgery.  CHG is an antiseptic cleaner which kills germs and bonds with the skin to continue killing germs even after washing. Please DO NOT use if you have an allergy to CHG or antibacterial soaps.  If your skin becomes reddened/irritated stop using the CHG and inform your nurse when you arrive at Short Stay. Do not shave (including legs and underarms) for at least 48 hours prior to the first CHG shower.  You may shave your face/neck.  Please follow these instructions carefully:  1.  Shower with CHG Soap the night before surgery and the  morning of surgery.  2.  If you choose to wash your hair, wash your hair first as usual with your normal   shampoo.  3.  After you shampoo, rinse your hair and body thoroughly to remove the shampoo.                             4.  Use CHG as you would any other liquid soap.  You can apply chg directly to the skin and wash.  Gently with a scrungie or clean washcloth.  5.  Apply the CHG Soap to your body ONLY FROM THE NECK DOWN.   Do   not use on face/ open                           Wound or open sores. Avoid contact with eyes, ears mouth and   genitals (private parts).                       Wash face,  Genitals (private parts) with your normal soap.             6.  Wash thoroughly, paying special attention to the area where your    surgery  will be performed.  7.  Thoroughly rinse your body with warm water from the neck down.  8.  DO NOT shower/wash with your normal soap after using and rinsing off the CHG Soap.                9.  Pat yourself dry with a clean towel.            10.  Wear clean pajamas.            11.  Place clean sheets on your bed the night of your first shower and do not  sleep with pets. Day of Surgery : Do not apply any lotions/deodorants the morning of surgery.  Please wear clean clothes to the hospital/surgery center.  FAILURE TO FOLLOW THESE INSTRUCTIONS MAY RESULT IN THE CANCELLATION OF YOUR SURGERY  PATIENT SIGNATURE_________________________________  NURSE SIGNATURE__________________________________  ________________________________________________________________________

## 2023-04-22 ENCOUNTER — Encounter (HOSPITAL_COMMUNITY): Payer: Self-pay

## 2023-04-22 ENCOUNTER — Other Ambulatory Visit: Payer: Self-pay

## 2023-04-22 ENCOUNTER — Encounter (HOSPITAL_COMMUNITY)
Admission: RE | Admit: 2023-04-22 | Discharge: 2023-04-22 | Disposition: A | Discharge status: Home / Self Care | Payer: 59 | Source: Ambulatory Visit | Attending: Surgery | Admitting: Surgery

## 2023-04-22 VITALS — BP 139/92 | HR 72 | Temp 98.2°F | Resp 16 | Ht 68.0 in | Wt 218.0 lb

## 2023-04-22 DIAGNOSIS — K4090 Unilateral inguinal hernia, without obstruction or gangrene, not specified as recurrent: Secondary | ICD-10-CM | POA: Diagnosis not present

## 2023-04-22 DIAGNOSIS — I10 Essential (primary) hypertension: Secondary | ICD-10-CM | POA: Insufficient documentation

## 2023-04-22 DIAGNOSIS — Z01812 Encounter for preprocedural laboratory examination: Secondary | ICD-10-CM | POA: Insufficient documentation

## 2023-04-22 DIAGNOSIS — I2510 Atherosclerotic heart disease of native coronary artery without angina pectoris: Secondary | ICD-10-CM | POA: Insufficient documentation

## 2023-04-22 DIAGNOSIS — Z955 Presence of coronary angioplasty implant and graft: Secondary | ICD-10-CM | POA: Diagnosis not present

## 2023-04-22 LAB — CBC
HCT: 47 % (ref 39.0–52.0)
Hemoglobin: 14.8 g/dL (ref 13.0–17.0)
MCH: 30.6 pg (ref 26.0–34.0)
MCHC: 31.5 g/dL (ref 30.0–36.0)
MCV: 97.1 fL (ref 80.0–100.0)
Platelets: 321 10*3/uL (ref 150–400)
RBC: 4.84 MIL/uL (ref 4.22–5.81)
RDW: 14.5 % (ref 11.5–15.5)
WBC: 6.9 10*3/uL (ref 4.0–10.5)
nRBC: 0 % (ref 0.0–0.2)

## 2023-04-22 LAB — BASIC METABOLIC PANEL
Anion gap: 11 (ref 5–15)
BUN: 18 mg/dL (ref 8–23)
CO2: 22 mmol/L (ref 22–32)
Calcium: 9 mg/dL (ref 8.9–10.3)
Chloride: 109 mmol/L (ref 98–111)
Creatinine, Ser: 1.24 mg/dL (ref 0.61–1.24)
GFR, Estimated: >60 mL/min (ref >60)
Glucose, Bld: 111 mg/dL — ABNORMAL HIGH (ref 70–99)
Potassium: 3.8 mmol/L (ref 3.5–5.1)
Sodium: 142 mmol/L (ref 135–145)

## 2023-04-23 NOTE — Progress Notes (Signed)
Anesthesia Chart Review   Case: [id number] Date/Time: 05/01/23 1445   Procedure: OPEN RIGHT INGUINAL HERNIA REPAIR WITH MESH (Right)   Anesthesia type: General   Pre-op diagnosis: HERNIA   Location: [facility] ROOM 02 / [facility]   Surgeons: Berna Bue, MD       DISCUSSION:71 y.o. never smoker with h/o CAD s/p DES 2016, hernia scheduled for above procedure 05/01/2023 with Dr. Phylliss Blakes.   Last seen by cardiology 02/22/2023. Per OV note pt denies chest pain, shortness of breath, dizziness, fatigue. HTN well controlled.  VS: BP (!) 139/92   Pulse 72   Temp 36.8 C (Oral)   Resp 16   Ht 5\' 8"  (1.727 m)   Wt 98.9 kg   SpO2 96%   BMI 33.15 kg/m   PROVIDERS: Philip Aspen, Limmie Patricia, MD is PCP   Cardiologist - Olga Millers MD  LABS: Labs reviewed: Acceptable for surgery. (all labs ordered are listed, but only abnormal results are displayed)  Labs Reviewed  BASIC METABOLIC PANEL - Abnormal; Notable for the following components:      Result Value   Glucose, Bld 111 (*)    All other components within normal limits  CBC     IMAGES:   EKG:   CV: Myocardial Perfusion 02/19/2019 Nuclear stress EF: 46%. The left ventricular ejection fraction is mildly decreased (45-54%). No T wave inversion was noted during stress. There was no ST segment deviation noted during stress. Defect 1: There is a small defect of mild severity present in the basal inferior location. This is an intermediate risk study.   Small size, mild intensity fixed basal to mid inferior wall perfusion defect, may represent artifact or scar. No reversible ischemia. LVEF 46% with basal to mid inferior hypokinesis. This is an intermediate risk study. Past Medical History:  Diagnosis Date   Coronary artery disease    GERD 04/26/2008   Myocardial infarction [facility])     Past Surgical History:  Procedure Laterality Date   CARDIAC CATHETERIZATION N/A 01/31/2015   Procedure: Left Heart Cath and Coronary  Angiography;  Surgeon: Tonny Bollman, MD;  Location: [facility] INVASIVE CV LAB;  Service: Cardiovascular;  Laterality: N/A;   PERCUTANEOUS CORONARY STENT INTERVENTION (PCI-S)  01/2015   LEFT CIRCUMFLEX  1ST & 2ND   OM BRANCHES    MEDICATIONS:  amLODipine (NORVASC) 5 MG tablet   aspirin EC 81 MG tablet   atorvastatin (LIPITOR) 80 MG tablet   fluticasone (FLONASE) 50 MCG/ACT nasal spray   nitroGLYCERIN (NITROSTAT) 0.4 MG SL tablet   No current facility-administered medications for this encounter.    Jodell Cipro Ward, PA-C [facility]urgical [facility] [phone]

## 2023-04-25 ENCOUNTER — Telehealth: Payer: Self-pay | Admitting: Internal Medicine

## 2023-04-25 NOTE — Telephone Encounter (Signed)
Left message on machine with Dr Ledell Noss recommendation.

## 2023-04-25 NOTE — Telephone Encounter (Signed)
Pt's spouse called to say Pt needs a CPE by 05/27/23, for insurance purposes.  MD has no availability for a CPE until 06/13/23.  Spouse asked if there is any way MD would be willing to see Pt before the deadline?  Spouse also asked if it would be possible for Pt to see another provider for a CPE, if MD cannot accommodate the request?  Please advise.

## 2023-05-01 ENCOUNTER — Ambulatory Visit (HOSPITAL_COMMUNITY): Payer: 59 | Admitting: Physician Assistant

## 2023-05-01 ENCOUNTER — Encounter (HOSPITAL_COMMUNITY): Payer: Self-pay | Admitting: Surgery

## 2023-05-01 ENCOUNTER — Other Ambulatory Visit: Payer: Self-pay

## 2023-05-01 ENCOUNTER — Encounter (HOSPITAL_COMMUNITY)
Admission: RE | Disposition: A | Discharge status: Home / Self Care | Payer: Self-pay | Source: Home / Self Care | Attending: Surgery

## 2023-05-01 ENCOUNTER — Ambulatory Visit (HOSPITAL_COMMUNITY)
Admission: RE | Admit: 2023-05-01 | Discharge: 2023-05-01 | Disposition: A | Discharge status: Home / Self Care | Payer: 59 | Attending: Surgery | Admitting: Surgery

## 2023-05-01 ENCOUNTER — Ambulatory Visit (HOSPITAL_BASED_OUTPATIENT_CLINIC_OR_DEPARTMENT_OTHER): Payer: 59 | Admitting: Certified Registered Nurse Anesthetist

## 2023-05-01 DIAGNOSIS — I10 Essential (primary) hypertension: Secondary | ICD-10-CM | POA: Insufficient documentation

## 2023-05-01 DIAGNOSIS — E785 Hyperlipidemia, unspecified: Secondary | ICD-10-CM | POA: Diagnosis not present

## 2023-05-01 DIAGNOSIS — I2510 Atherosclerotic heart disease of native coronary artery without angina pectoris: Secondary | ICD-10-CM | POA: Insufficient documentation

## 2023-05-01 DIAGNOSIS — Z6833 Body mass index (BMI) 33.0-33.9, adult: Secondary | ICD-10-CM | POA: Insufficient documentation

## 2023-05-01 DIAGNOSIS — E669 Obesity, unspecified: Secondary | ICD-10-CM | POA: Diagnosis not present

## 2023-05-01 DIAGNOSIS — K219 Gastro-esophageal reflux disease without esophagitis: Secondary | ICD-10-CM | POA: Diagnosis not present

## 2023-05-01 DIAGNOSIS — I252 Old myocardial infarction: Secondary | ICD-10-CM | POA: Insufficient documentation

## 2023-05-01 DIAGNOSIS — K4090 Unilateral inguinal hernia, without obstruction or gangrene, not specified as recurrent: Secondary | ICD-10-CM | POA: Diagnosis present

## 2023-05-01 DIAGNOSIS — Z955 Presence of coronary angioplasty implant and graft: Secondary | ICD-10-CM | POA: Insufficient documentation

## 2023-05-01 HISTORY — PX: INGUINAL HERNIA REPAIR: SHX194

## 2023-05-01 SURGERY — REPAIR, HERNIA, INGUINAL, ADULT
Anesthesia: General | Laterality: Right

## 2023-05-01 MED ORDER — CHLORHEXIDINE GLUCONATE 4 % EX SOLN: 60.0000 mL | Freq: Once | CUTANEOUS | Status: DC

## 2023-05-01 MED ORDER — BUPIVACAINE LIPOSOME 1.3 % IJ SUSP
INTRAMUSCULAR | Status: AC
  Filled 2023-05-01: qty 20

## 2023-05-01 MED ORDER — GABAPENTIN 300 MG PO CAPS
300.0000 mg | ORAL_CAPSULE | ORAL | Status: AC
  Administered 2023-05-01: 300 mg via ORAL
  Filled 2023-05-01: qty 1

## 2023-05-01 MED ORDER — BUPIVACAINE LIPOSOME 1.3 % IJ SUSP
INTRAMUSCULAR | Status: DC | PRN
Start: 2023-05-01 — End: 2023-05-01
  Administered 2023-05-01: 20 mL

## 2023-05-01 MED ORDER — BUPIVACAINE-EPINEPHRINE 0.25% -1:200000 IJ SOLN
INTRAMUSCULAR | Status: AC
  Filled 2023-05-01: qty 1

## 2023-05-01 MED ORDER — PROPOFOL 10 MG/ML IV BOLUS
INTRAVENOUS | Status: AC
  Filled 2023-05-01: qty 20

## 2023-05-01 MED ORDER — OXYCODONE HCL 5 MG PO TABS
5.0000 mg | ORAL_TABLET | Freq: Three times a day (TID) | ORAL | 0 refills | Status: AC | PRN
Start: 2023-05-01 — End: 2023-05-06

## 2023-05-01 MED ORDER — CHLORHEXIDINE GLUCONATE 0.12 % MT SOLN
15.0000 mL | Freq: Once | OROMUCOSAL | Status: AC
  Administered 2023-05-01: 15 mL via OROMUCOSAL

## 2023-05-01 MED ORDER — FENTANYL CITRATE PF 50 MCG/ML IJ SOSY: 25.0000 ug | PREFILLED_SYRINGE | INTRAMUSCULAR | Status: DC | PRN

## 2023-05-01 MED ORDER — CEFAZOLIN SODIUM-DEXTROSE 2-4 GM/100ML-% IV SOLN
2.0000 g | INTRAVENOUS | Status: AC
  Administered 2023-05-01: 2 g via INTRAVENOUS
  Filled 2023-05-01: qty 100

## 2023-05-01 MED ORDER — ONDANSETRON HCL 4 MG/2ML IJ SOLN
INTRAMUSCULAR | Status: AC
  Filled 2023-05-01: qty 2

## 2023-05-01 MED ORDER — LACTATED RINGERS IV SOLN: INTRAVENOUS | Status: DC

## 2023-05-01 MED ORDER — FENTANYL CITRATE (PF) 250 MCG/5ML IJ SOLN
INTRAMUSCULAR | Status: AC
  Filled 2023-05-01: qty 5

## 2023-05-01 MED ORDER — ACETAMINOPHEN 500 MG PO TABS
1000.0000 mg | ORAL_TABLET | ORAL | Status: AC
  Administered 2023-05-01: 1000 mg via ORAL
  Filled 2023-05-01: qty 2

## 2023-05-01 MED ORDER — FENTANYL CITRATE (PF) 100 MCG/2ML IJ SOLN
INTRAMUSCULAR | Status: DC | PRN
  Administered 2023-05-01 (×4): 25 ug via INTRAVENOUS

## 2023-05-01 MED ORDER — PROPOFOL 10 MG/ML IV BOLUS
INTRAVENOUS | Status: DC | PRN
  Administered 2023-05-01: 200 mg via INTRAVENOUS

## 2023-05-01 MED ORDER — DEXAMETHASONE SODIUM PHOSPHATE 10 MG/ML IJ SOLN
INTRAMUSCULAR | Status: DC | PRN
  Administered 2023-05-01: 10 mg via INTRAVENOUS

## 2023-05-01 MED ORDER — EPHEDRINE 5 MG/ML INJ
INTRAVENOUS | Status: AC
  Filled 2023-05-01: qty 5

## 2023-05-01 MED ORDER — 0.9 % SODIUM CHLORIDE (POUR BTL) OPTIME
TOPICAL | Status: DC | PRN
  Administered 2023-05-01: 1000 mL

## 2023-05-01 MED ORDER — DOCUSATE SODIUM 100 MG PO CAPS: 100.0000 mg | ORAL_CAPSULE | Freq: Two times a day (BID) | ORAL | 0 refills | Status: AC

## 2023-05-01 MED ORDER — FENTANYL CITRATE (PF) 100 MCG/2ML IJ SOLN
INTRAMUSCULAR | Status: AC
  Filled 2023-05-01: qty 2

## 2023-05-01 MED ORDER — BUPIVACAINE-EPINEPHRINE (PF) 0.25% -1:200000 IJ SOLN
INTRAMUSCULAR | Status: DC | PRN
  Administered 2023-05-01: 30 mL

## 2023-05-01 MED ORDER — ACETAMINOPHEN 10 MG/ML IV SOLN: 1000.0000 mg | Freq: Once | INTRAVENOUS | Status: DC | PRN

## 2023-05-01 MED ORDER — BUPIVACAINE LIPOSOME 1.3 % IJ SUSP: 20.0000 mL | Freq: Once | INTRAMUSCULAR | Status: DC

## 2023-05-01 MED ORDER — EPHEDRINE SULFATE-NACL 50-0.9 MG/10ML-% IV SOSY
PREFILLED_SYRINGE | INTRAVENOUS | Status: DC | PRN
Start: 2023-05-01 — End: 2023-05-01
  Administered 2023-05-01: 5 mg via INTRAVENOUS
  Administered 2023-05-01: 10 mg via INTRAVENOUS
  Administered 2023-05-01: 5 mg via INTRAVENOUS

## 2023-05-01 MED ORDER — LIDOCAINE HCL (PF) 2 % IJ SOLN
INTRAMUSCULAR | Status: AC
  Filled 2023-05-01: qty 5

## 2023-05-01 MED ORDER — LIDOCAINE 2% (20 MG/ML) 5 ML SYRINGE
INTRAMUSCULAR | Status: DC | PRN
  Administered 2023-05-01: 60 mg via INTRAVENOUS

## 2023-05-01 MED ORDER — DEXAMETHASONE SODIUM PHOSPHATE 10 MG/ML IJ SOLN
INTRAMUSCULAR | Status: AC
  Filled 2023-05-01: qty 1

## 2023-05-01 MED ORDER — ORAL CARE MOUTH RINSE: 15.0000 mL | Freq: Once | OROMUCOSAL | Status: AC

## 2023-05-01 MED ORDER — ONDANSETRON HCL 4 MG/2ML IJ SOLN
INTRAMUSCULAR | Status: DC | PRN
  Administered 2023-05-01: 4 mg via INTRAVENOUS

## 2023-05-01 MED ORDER — ONDANSETRON HCL 4 MG/2ML IJ SOLN
4.0000 mg | Freq: Once | INTRAMUSCULAR | Status: AC | PRN
  Administered 2023-05-01: 4 mg via INTRAVENOUS

## 2023-05-01 MED ORDER — LABETALOL HCL 5 MG/ML IV SOLN
INTRAVENOUS | Status: AC
  Filled 2023-05-01: qty 4

## 2023-05-01 SURGICAL SUPPLY — 35 items
APL PRP STRL LF DISP 70% ISPRP (MISCELLANEOUS) ×1
APL SKNCLS STERI-STRIP NONHPOA (GAUZE/BANDAGES/DRESSINGS) ×1
BAG COUNTER SPONGE SURGICOUNT (BAG) IMPLANT
BAG SPNG CNTER NS LX DISP (BAG)
BENZOIN TINCTURE PRP APPL 2/3 (GAUZE/BANDAGES/DRESSINGS) ×1 IMPLANT
BLADE SURG 15 STRL LF DISP TIS (BLADE) ×1 IMPLANT
BLADE SURG 15 STRL SS (BLADE) ×1
CHLORAPREP W/TINT 26 (MISCELLANEOUS) ×1 IMPLANT
COVER SURGICAL LIGHT HANDLE (MISCELLANEOUS) ×1 IMPLANT
DRAIN PENROSE 0.5X18 (DRAIN) ×1 IMPLANT
DRAPE LAPAROSCOPIC ABDOMINAL (DRAPES) ×1 IMPLANT
ELECT REM PT RETURN 15FT ADLT (MISCELLANEOUS) ×1 IMPLANT
GAUZE SPONGE 4X4 12PLY STRL (GAUZE/BANDAGES/DRESSINGS) IMPLANT
GLOVE BIO SURGEON STRL SZ 6 (GLOVE) ×1 IMPLANT
GLOVE INDICATOR 6.5 STRL GRN (GLOVE) ×1 IMPLANT
GOWN STRL REUS W/ TWL LRG LVL3 (GOWN DISPOSABLE) ×1 IMPLANT
GOWN STRL REUS W/TWL LRG LVL3 (GOWN DISPOSABLE) ×1
KIT BASIN OR (CUSTOM PROCEDURE TRAY) ×1 IMPLANT
KIT TURNOVER KIT A (KITS) IMPLANT
MARKER SKIN DUAL TIP RULER LAB (MISCELLANEOUS) ×1 IMPLANT
MESH ULTRAPRO 3X6 7.6X15CM (Mesh General) IMPLANT
NDL HYPO 22X1.5 SAFETY MO (MISCELLANEOUS) ×1 IMPLANT
NEEDLE HYPO 22X1.5 SAFETY MO (MISCELLANEOUS) ×1
PACK GENERAL/GYN (CUSTOM PROCEDURE TRAY) ×1 IMPLANT
SPIKE FLUID TRANSFER (MISCELLANEOUS) ×1 IMPLANT
STRIP CLOSURE SKIN 1/2X4 (GAUZE/BANDAGES/DRESSINGS) ×1 IMPLANT
SUT ETHIBOND 0 MO6 C/R (SUTURE) ×1 IMPLANT
SUT MNCRL AB 4-0 PS2 18 (SUTURE) ×1 IMPLANT
SUT PDS AB 0 CT1 36 (SUTURE) ×2 IMPLANT
SUT VIC AB 3-0 SH 27 (SUTURE) ×2
SUT VIC AB 3-0 SH 27XBRD (SUTURE) ×2 IMPLANT
SUT VICRYL 3 0 BR 18 UND (SUTURE) ×1 IMPLANT
SYR CONTROL 10ML LL (SYRINGE) ×1 IMPLANT
TOWEL OR 17X26 10 PK STRL BLUE (TOWEL DISPOSABLE) ×1 IMPLANT
TOWEL OR NON WOVEN STRL DISP B (DISPOSABLE) ×1 IMPLANT

## 2023-05-01 NOTE — Discharge Instructions (Signed)
HERNIA REPAIR: POST OP INSTRUCTIONS   EAT Gradually transition to a high fiber diet with a fiber supplement over the next few weeks after discharge.  Start with a pureed / full liquid diet (see below)  WALK Walk an hour a day (cumulative- not all at once).  Control your pain to do that.    CONTROL PAIN Control pain so that you can walk, sleep, tolerate sneezing/coughing, and go up/down stairs.  HAVE A BOWEL MOVEMENT DAILY Keep your bowels regular to avoid problems.  OK to try a laxative to override constipation.  OK to use an antidiarrheal to slow down diarrhea.  Call if not better after 2 tries  CALL IF YOU HAVE PROBLEMS/CONCERNS Call if you are still struggling despite following these instructions. Call if you have concerns not answered by these instructions  ######################################################################    DIET: Follow a light bland diet & liquids the first 24 hours after arrival home, such as soup, liquids, starches, etc.  Be sure to drink plenty of fluids.  Quickly advance to a usual solid diet within a few days.  Avoid fast food or heavy meals initially as you are more likely to get nauseated or have irregular bowels.  A low-sugar, high-fiber diet for the rest of your life is ideal.   Take your usually prescribed home medications unless otherwise directed.  PAIN CONTROL: Pain is best controlled by a usual combination of three different methods TOGETHER: Ice/Heat Over the counter pain medication Prescription pain medication Most patients will experience some swelling and bruising around the hernia(s) such as the bellybutton, groins, or old incisions.  Ice packs or heating pads (30-60 minutes up to 6 times a day) will help. Use ice for the first few days to help decrease swelling and bruising, then switch to heat to help relax tight/sore spots and speed recovery.  Some people prefer to use ice alone, heat alone, alternating between ice & heat.  Experiment  to what works for you.  Swelling and bruising can take several weeks to resolve.   It is helpful to take an over-the-counter pain medication regularly for the first days: Naproxen (Aleve, etc)  Two 220mg  tabs twice a day OR Ibuprofen (Advil, etc) Three 200mg  tabs four times a day (every meal & bedtime) AND Acetaminophen (Tylenol, etc) 325-650mg  four times a day (every meal & bedtime) A  prescription for pain medication should be given to you upon discharge.  Take your pain medication as prescribed, IF NEEDED.  If you are having problems/concerns with the prescription medicine (does not control pain, nausea, vomiting, rash, itching, etc), please call us [phone] to see if we need to switch you to a different pain medicine that will work better for you and/or control your side effect better. If you need a refill on your pain medication, please contact your pharmacy.  They will contact our office to request authorization. Prescriptions will not be filled after 5 pm or on week-ends.  Avoid getting constipated.  Between the surgery and the pain medications, it is common to experience some constipation.  Increasing fluid intake and taking a fiber supplement (such as Metamucil, Citrucel, FiberCon, MiraLax, etc) 1-2 times a day regularly will usually help prevent this problem from occurring.  A mild laxative (prune juice, Milk of Magnesia, MiraLax, etc) should be taken according to package directions if there are no bowel movements after 48 hours.    Wash / shower every day, starting 2 days after surgery.  You may shower over  the steri strips or skin glue which are waterproof.  No rubbing, scrubbing, lotions or ointments to incision(s). Do not soak or submerge.   Remove your outer bandage 2 days after surgery. Steri strips (small white tapes) will peel off after 1-2 weeks. You may leave the incision open to air.  You may replace a dressing/Band-Aid to cover an incision for comfort if you wish.  Continue to  shower over incision(s) after the dressing is off.  ACTIVITIES as tolerated:   You may resume regular (light) daily activities beginning the next day--such as daily self-care, walking, climbing stairs--gradually increasing activities as tolerated.  Control your pain so that you can walk an hour a day.  If you can walk 30 minutes without difficulty, it is safe to try more intense activity such as jogging, treadmill, bicycling, low-impact aerobics, swimming, etc. Refrain from the most intensive and strenuous activity such as sit-ups, heavy lifting, contact sports, etc  Refrain from any heavy lifting or straining until 6 weeks after surgery.   DO NOT PUSH THROUGH PAIN.  Let pain be your guide: If it hurts to do something, don't do it.  Pain is your body warning you to avoid that activity for another week until the pain goes down. You may drive when you are no longer taking prescription pain medication, you can comfortably wear a seatbelt, and you can safely maneuver your car and apply brakes. You may have sexual intercourse when it is comfortable.   FOLLOW UP in our office Please call [facility] at [phone] to set up an appointment to see your surgeon in the office for a follow-up appointment approximately 2-3 weeks after your surgery. Make sure that you call for this appointment the day you arrive home to insure a convenient appointment time.  9.  If you have disability of FMLA / Family leave forms, please bring the forms to the office for processing.  (do not give to your surgeon).  WHEN TO CALL us [phone]: Poor pain control Reactions / problems with new medications (rash/itching, nausea, etc)  Fever over 101.5 F (38.5 C) Inability to urinate Nausea and/or vomiting Worsening swelling or bruising Continued bleeding from incision. Increased pain, redness, or drainage from the incision   The clinic staff is available to answer your questions during regular business hours (8:30am-5pm).   Please don't hesitate to call and ask to speak to one of our nurses for clinical concerns.   If you have a medical emergency, go to the nearest emergency room or call 911.  A surgeon from [facility] is always on call at the hospitals in [facility], [place] [address], [place], [place]  [address] ?  [address] [address], [place], [place]   [address] MAIN: [phone] ? TOLL FREE: [phone] ? FAX: [phone] [email or web address]

## 2023-05-01 NOTE — Anesthesia Postprocedure Evaluation (Signed)
Anesthesia Post Note  Patient: Nicki Schnider Kavanagh  Procedure(s) Performed: OPEN RIGHT INGUINAL HERNIA REPAIR WITH MESH (Right)     Patient location during evaluation: PACU Anesthesia Type: General Level of consciousness: awake and alert Pain management: pain level controlled Vital Signs Assessment: post-procedure vital signs reviewed and stable Respiratory status: spontaneous breathing, nonlabored ventilation, respiratory function stable and patient connected to nasal cannula oxygen Cardiovascular status: blood pressure returned to baseline and stable Postop Assessment: no apparent nausea or vomiting Anesthetic complications: no   No notable events documented.  Last Vitals:  Vitals:   05/01/23 1545 05/01/23 1550  BP: 121/70 125/73  Pulse: 64 64  Resp: 18 11  Temp:  36.7 C  SpO2: 98% 95%    Last Pain:  Vitals:   05/01/23 1550  TempSrc:   PainSc: 1                  Trevor Iha

## 2023-05-01 NOTE — Transfer of Care (Signed)
Immediate Anesthesia Transfer of Care Note  Patient: Dano Sarabia Ives  Procedure(s) Performed: OPEN RIGHT INGUINAL HERNIA REPAIR WITH MESH (Right)  Patient Location: PACU  Anesthesia Type:General  Level of Consciousness: awake  Airway & Oxygen Therapy: Patient Spontanous Breathing and Patient connected to face mask oxygen  Post-op Assessment: Report given to RN and Post -op Vital signs reviewed and stable  Post vital signs: Reviewed and stable  Last Vitals:  Vitals Value Taken Time  BP    Temp    Pulse 71 05/01/23 1504  Resp 14 05/01/23 1504  SpO2 100 % 05/01/23 1504  Vitals shown include unfiled device data.  Last Pain:  Vitals:   05/01/23 1305  TempSrc:   PainSc: 0-No pain         Complications: No notable events documented.

## 2023-05-01 NOTE — Op Note (Signed)
Operative Note  NEEDHAM MACGOWAN  [id number]  [id number]  05/01/2023   Surgeon: Phylliss Blakes MD FACS   Procedure performed: Open right inguinal hernia repair with mesh   Preop diagnosis:  right inguinal hernia   Post-op diagnosis/intraop findings: indirect inguinal hernia   Specimens: none   EBL: 5cc   Complications: none   Description of procedure: After confirming informed consent, the patient was taken to the operating room and placed supine on operating room table where general anesthesia was initiated, preoperative antibiotics were administered, SCDs applied, and a formal timeout was performed. The groin was clipped, prepped and draped in the usual sterile fashion. An oblique incision was made the just above the inguinal ligament after infiltrating the tissues with local anesthetic (Exparel mixed with quarter percent Marcaine with epinephrine). Soft tissues were dissected using electrocautery until the external oblique aponeurosis was encountered. This was divided sharply to expand the external ring. A plane was bluntly developed between the spermatic cord and the external oblique. The ilioinguinal nerve was divided between hemostats and ligated with 3-0 Vicryl ties. The spermatic cord was then bluntly dissected away from the pubic tubercle and encircled with a Penrose. Inspection of the inguinal anatomy revealed a large indirect hernia and complete disruption of the inguinal floor. The indirect hernia sac was bluntly dissected away from the cord structures and skeletonized to the level of the internal ring.  The inferior aspect of the sac was very thin and attenuated and did tear during the dissection; I was able to probe with my finger through this small opening and confirmed this to be peritoneum without any incarcerated structures at this time.  The tear in the peritoneum/hernia sac was closed with running 3-0 Vicryl.  The hernia sac was then reduced intact into the abdominal cavity.  The  inguinal floor was reconstructed suturing the conjoined tendon to the inguinal ligament with interrupted 0 PDS, leaving an internal ring just sufficient for the cord structures. A 3 x 6 piece of ultra Pro mesh was brought onto the field and trimmed to approximate the field. This was sutured to the pubic tubercle fascia, inferior shelving edge and to the internal oblique superiorly with interrupted 0 ethibonds. The tails of the mesh were wrapped around the spermatic cord, ensuring adequate room for the cord, and sutured to each other with 0 ethibond, and then directed laterally to lie flat beneath the external oblique aponeurosis.  An additional 0 Ethibond was placed medially to reinforce the slit in the mesh.  Hemostasis was ensured within the wound. The Penrose was removed. The external oblique aponeurosis was reapproximated with a running 3-0 Vicryl to re-create a narrowed external ring. More local was infiltrated around the pubic tubercle and in the plane just below the external oblique. The Scarpa's was reapproximated with interrupted 3-0 Vicryls. The skin was closed with a running subcuticular 4-0 Monocryl. The remainder of the local was injected in the subcutaneous and subcuticular space. The field was then cleaned, benzoin and Steri-Strips and sterile bandage were applied. The patient was then awakened extubated and taken to PACU in stable condition.    All counts were correct at the completion of the case

## 2023-05-01 NOTE — Anesthesia Procedure Notes (Signed)
Procedure Name: LMA Insertion Date/Time: 05/01/2023 1:32 PM  Performed by: Trevor Iha, MDPatient Re-evaluated:Patient Re-evaluated prior to induction Oxygen Delivery Method: Circle system utilized Preoxygenation: Pre-oxygenation with 100% oxygen Induction Type: IV induction LMA: LMA with gastric port inserted LMA Size: 4.0 Number of attempts: 2 Placement Confirmation: breath sounds checked- equal and bilateral and positive ETCO2 Tube secured with: Tape Dental Injury: Teeth and Oropharynx as per pre-operative assessment

## 2023-05-01 NOTE — H&P (Signed)
Raymond Norris [id number]   Referring Provider:  Philip Aspen, Est[place]*   Subjective   Chief Complaint: New Consultation     History of Present Illness:    72 year old man with history of CAD/MI s/p PCI stent 2016 (cardiologist is Dr. Olga Millers), GERD hypertension and hyperlipidemia, obesity who presents for evaluation of a right inguinal hernia.  He has known about this for quite some time (several months) and previously it had been easily reducible and without any pain.  Last month while having intercourse he had significant right inguinal pain that prompted him to go to the emergency department where a right inguinal hernia was noted and easily reducible. He has been cautious with lifting since that time and has been asymptomatic in the interim.   Review of Systems: A complete review of systems was obtained from the patient.  I have reviewed this information and discussed as appropriate with the patient.  See HPI as well for other ROS.   Medical History: Past Medical History:  Diagnosis Date   GERD (gastroesophageal reflux disease)     There is no problem list on file for this patient.   Past Surgical History:  Procedure Laterality Date   Cardiac catheterization  2016     No Known Allergies  Current Outpatient Medications on File Prior to Visit  Medication Sig Dispense Refill   amLODIPine (NORVASC) 5 MG tablet Take 1 tablet by mouth once daily     aspirin 81 MG EC tablet Take 81 mg by mouth once daily     atorvastatin (LIPITOR) 80 MG tablet Take 1 tablet by mouth once daily     cetirizine (ZYRTEC) 10 MG tablet Take 10 mg by mouth once daily     fluticasone propionate (FLONASE) 50 mcg/actuation nasal spray 2 sprays each nost qd x 1 month, then 1 spry q nost qd     No current facility-administered medications on file prior to visit.    History reviewed. No pertinent family history.   Social History   Tobacco Use  Smoking Status Never  Smokeless  Tobacco Never     Social History   Socioeconomic History   Marital status: Married  Tobacco Use   Smoking status: Never   Smokeless tobacco: Never  Substance and Sexual Activity   Alcohol use: Never   Drug use: Never    Objective:    Vitals:   02/26/23 1523  BP: 120/70  Pulse: 67  Temp: 36.7 C (98.1 F)  SpO2: 96%  Weight: 99.3 kg (219 lb)  Height: 174 cm (5' 8.5")  PainSc: 0-No pain  PainLoc: Abdomen    Body mass index is 32.81 kg/m.  Alert well-appearing Unlabored respirations Abdomen soft, nontender.  He has a partially reducible right inguinal hernia which is nontender, no overlying skin changes.  Assessment and Plan:  Diagnoses and all orders for this visit:  Non-recurrent unilateral inguinal hernia without obstruction or gangrene    We discussed the relevant anatomy and we discussed options for repair.  I recommend an open approach and went over the technique of the procedure.  Discussed risks of bleeding, infection, pain, scarring, injury to structures in the area including nerves, blood vessels, bowel, bladder, risk of chronic pain, hernia recurrence, risk of seroma or hematoma, urinary retention, and risks of general anesthesia including cardiovascular, pulmonary, and thromboembolic complications.  We discussed typical postop recovery, timeline, and activity limitations.  We also discussed the option of ongoing observation, with high rate of ultimately  returning for surgery and risk of increasing size/symptoms from the hernia as well as incarceration/strangulation and went over symptoms that should prompt him to seek emergency treatment.  Questions were come and answered to the patient's satisfaction. Patient wishes to proceed with scheduling. Will request cardiac clearance.     CHELSEA Carlye Grippe, MD

## 2023-05-01 NOTE — Anesthesia Preprocedure Evaluation (Addendum)
Anesthesia Evaluation  Patient identified by MRN, date of birth, ID band Patient awake    Reviewed: Allergy & Precautions, NPO status , Patient's Chart, lab work & pertinent test results  Airway Mallampati: II  TM Distance: >3 FB Neck ROM: Full    Dental no notable dental hx. (+) Teeth Intact, Dental Advisory Given   Pulmonary neg pulmonary ROS   Pulmonary exam normal breath sounds clear to auscultation       Cardiovascular Exercise Tolerance: Good hypertension, + CAD, + Past MI and + Cardiac Stents  Normal cardiovascular exam Rhythm:Regular Rate:Normal     Neuro/Psych negative neurological ROS  negative psych ROS   GI/Hepatic ,GERD  ,,  Endo/Other    Renal/GU      Musculoskeletal negative musculoskeletal ROS (+)    Abdominal   Peds  Hematology Lab Results      Component                Value               Date                      WBC                      6.9                 04/22/2023                HGB                      14.8                04/22/2023                HCT                      47.0                04/22/2023                MCV                      97.1                04/22/2023                PLT                      321                 04/22/2023              Anesthesia Other Findings   Reproductive/Obstetrics                             Anesthesia Physical Anesthesia Plan  ASA: 3  Anesthesia Plan: General   Post-op Pain Management: Tylenol PO (pre-op)*   Induction: Intravenous  PONV Risk Score and Plan: 3 and Treatment may vary due to age or medical condition, Ondansetron and Midazolam  Airway Management Planned: LMA  Additional Equipment: None  Intra-op Plan:   Post-operative Plan:   Informed Consent: I have reviewed the patients History and Physical, chart, labs and discussed the procedure including the risks, benefits and alternatives for the  proposed anesthesia with the patient or authorized  representative who has indicated his/her understanding and acceptance.     Dental advisory given  Plan Discussed with: CRNA and Anesthesiologist  Anesthesia Plan Comments:        Anesthesia Quick Evaluation

## 2023-05-02 ENCOUNTER — Encounter (HOSPITAL_COMMUNITY): Payer: Self-pay | Admitting: Surgery

## 2023-08-31 ENCOUNTER — Other Ambulatory Visit: Payer: Self-pay | Admitting: Internal Medicine

## 2023-11-05 ENCOUNTER — Ambulatory Visit (INDEPENDENT_AMBULATORY_CARE_PROVIDER_SITE_OTHER): Admitting: Internal Medicine

## 2023-11-05 ENCOUNTER — Encounter: Payer: Self-pay | Admitting: Internal Medicine

## 2023-11-05 VITALS — BP 120/80 | HR 76 | Temp 98.1°F | Wt 219.6 lb

## 2023-11-05 DIAGNOSIS — R0982 Postnasal drip: Secondary | ICD-10-CM | POA: Diagnosis not present

## 2023-11-05 MED ORDER — CETIRIZINE HCL 10 MG PO TABS
10.0000 mg | ORAL_TABLET | Freq: Every day | ORAL | 1 refills | Status: DC
Start: 2023-11-05 — End: 2024-05-18

## 2023-11-05 NOTE — Progress Notes (Signed)
 Established Patient Office Visit     CC/Reason for Visit: Discuss acute concerns  HPI: Raymond Norris is a 73 y.o. male who is coming in today for the above mentioned reasons.  He has been complaining of postnasal drip and a reactive cough now for few months.  He feels like it is getting worse as the warmer weather comes.  No fever, no sick contacts or recent travel.   Past Medical/Surgical History: Past Medical History:  Diagnosis Date   Coronary artery disease    GERD 04/26/2008   Myocardial infarction [facility])     Past Surgical History:  Procedure Laterality Date   CARDIAC CATHETERIZATION N/A 01/31/2015   Procedure: Left Heart Cath and Coronary Angiography;  Surgeon: Tonny Bollman, MD;  Location: [facility] INVASIVE CV LAB;  Service: Cardiovascular;  Laterality: N/A;   INGUINAL HERNIA REPAIR Right 05/01/2023   Procedure: OPEN RIGHT INGUINAL HERNIA REPAIR WITH MESH;  Surgeon: Berna Bue, MD;  Location: [facility] ORS;  Service: General;  Laterality: Right;   PERCUTANEOUS CORONARY STENT INTERVENTION (PCI-S)  01/2015   LEFT CIRCUMFLEX  1ST & 2ND   OM BRANCHES    Social History:  reports that he has never smoked. He has never used smokeless tobacco. He reports that he does not drink alcohol and does not use drugs.  Allergies: No Known Allergies  Family History:  Family History  Family history unknown: Yes     Current Outpatient Medications:    amLODipine (NORVASC) 5 MG tablet, TAKE 1 TABLET BY MOUTH EVERY DAY. (Patient taking differently: Take 5 mg by mouth daily.), Disp: 90 tablet, Rfl: 3   aspirin EC 81 MG tablet, Take 81 mg by mouth daily. Swallow whole., Disp: , Rfl:    atorvastatin (LIPITOR) 80 MG tablet, TAKE 1 TABLET BY MOUTH DAILY. (Patient taking differently: Take 80 mg by mouth daily.), Disp: 90 tablet, Rfl: 3   cetirizine (ZYRTEC) 10 MG tablet, Take 1 tablet (10 mg total) by mouth daily., Disp: 90 tablet, Rfl: 1   fluticasone (FLONASE) 50 MCG/ACT nasal spray, PLACE 2  SPRAYS INTO EACH NOSTRIL ONCE DAILY FOR 1 MONTH THEN USE 1 SPRAY IN EACH NOSTRIL EVERY DAY, Disp: 16 g, Rfl: 1   nitroGLYCERIN (NITROSTAT) 0.4 MG SL tablet, Place 1 tablet (0.4 mg total) under the tongue every 5 (five) minutes x 3 doses as needed for chest pain., Disp: 25 tablet, Rfl: 12  Review of Systems:  Negative unless indicated in HPI.   Physical Exam: Vitals:   11/05/23 1152  BP: 120/80  Pulse: 76  Temp: 98.1 F (36.7 C)  TempSrc: Oral  SpO2: 98%  Weight: 219 lb 9.6 oz (99.6 kg)    Body mass index is 33.39 kg/m.   Physical Exam Vitals reviewed.  Constitutional:      Appearance: Normal appearance.  HENT:     Right Ear: Tympanic membrane, ear canal and external ear normal.     Left Ear: Tympanic membrane, ear canal and external ear normal.     Mouth/Throat:     Mouth: Mucous membranes are moist.     Pharynx: Posterior oropharyngeal erythema present.  Eyes:     Conjunctiva/sclera: Conjunctivae normal.     Pupils: Pupils are equal, round, and reactive to light.  Cardiovascular:     Rate and Rhythm: Normal rate and regular rhythm.  Pulmonary:     Effort: Pulmonary effort is normal.     Breath sounds: Normal breath sounds.  Neurological:  Mental Status: He is alert.      Impression and Plan:  PND (post-nasal drip) -     Cetirizine HCl; Take 1 tablet (10 mg total) by mouth daily.  Dispense: 90 tablet; Refill: 1  -Suspect seasonal allergies that are now worsening with the coming of spring.  Have advised continue use of Flonase and will add daily cetirizine.   Time spent:22 minutes reviewing chart, interviewing and examining patient and formulating plan of care.     Chaya Jan, MD [facility] at [facility]

## 2023-12-25 ENCOUNTER — Encounter: Payer: Self-pay | Admitting: Family Medicine

## 2023-12-25 ENCOUNTER — Other Ambulatory Visit: Payer: Self-pay

## 2023-12-25 ENCOUNTER — Ambulatory Visit: Admitting: Family Medicine

## 2023-12-25 VITALS — BP 132/72 | HR 71 | Temp 97.8°F | Ht 68.0 in | Wt 217.8 lb

## 2023-12-25 DIAGNOSIS — R1310 Dysphagia, unspecified: Secondary | ICD-10-CM | POA: Diagnosis not present

## 2023-12-25 MED ORDER — NITROGLYCERIN 0.4 MG SL SUBL: 0.4000 mg | SUBLINGUAL_TABLET | SUBLINGUAL | 12 refills | Status: AC | PRN

## 2023-12-25 NOTE — Progress Notes (Signed)
 Established Patient Office Visit   Subjective  Patient ID: Raymond Norris, male    DOB: 22-Oct-1950  Age: 73 y.o. MRN: [id number]  Chief Complaint  Patient presents with   Choking    Patient is a 73 year old male followed with Dr. Ival Marines and seen for acute concern.  Patient endorses dysphagia for hamburger.  Feels like meat gets stuck inside of throat.  Other meats, soft foods, and liquids go down without difficulty.  Patient denies overt heartburn symptoms.  If develops heartburn drinks a ginger ale to cause belching which relieves symptoms.    Patient Active Problem List   Diagnosis Date Noted   Bruit 08/04/2015   Dyslipidemia 03/02/2015   NSTEMI (non-ST elevated myocardial infarction) (HCC) 01/31/2015   Unstable angina (HCC) 01/31/2015   Benign essential HTN 01/31/2015   CAD (coronary artery disease) 01/31/2015   GERD 04/26/2008   Past Medical History:  Diagnosis Date   Coronary artery disease    GERD 04/26/2008   Myocardial infarction [facility])    Past Surgical History:  Procedure Laterality Date   CARDIAC CATHETERIZATION N/A 01/31/2015   Procedure: Left Heart Cath and Coronary Angiography;  Surgeon: Arnoldo Lapping, MD;  Location: [facility] INVASIVE CV LAB;  Service: Cardiovascular;  Laterality: N/A;   INGUINAL HERNIA REPAIR Right 05/01/2023   Procedure: OPEN RIGHT INGUINAL HERNIA REPAIR WITH MESH;  Surgeon: Adalberto Acton, MD;  Location: [facility] ORS;  Service: General;  Laterality: Right;   PERCUTANEOUS CORONARY STENT INTERVENTION (PCI-S)  01/2015   LEFT CIRCUMFLEX  1ST & 2ND   OM BRANCHES   Social History   Tobacco Use   Smoking status: Never   Smokeless tobacco: Never  Vaping Use   Vaping status: Never Used  Substance Use Topics   Alcohol use: No   Drug use: No   Family History  Family history unknown: Yes   No Known Allergies    ROS Negative unless stated above    Objective:     BP 132/72 (BP Location: Left Arm, Patient Position: Sitting, Cuff Size: Normal)    Pulse 71   Temp 97.8 F (36.6 C) (Oral)   Ht 5\' 8"  (1.727 m)   Wt 217 lb 12.8 oz (98.8 kg)   SpO2 96%   BMI 33.12 kg/m  BP Readings from Last 3 Encounters:  12/25/23 132/72  11/05/23 120/80  05/01/23 125/73   Wt Readings from Last 3 Encounters:  12/25/23 217 lb 12.8 oz (98.8 kg)  11/05/23 219 lb 9.6 oz (99.6 kg)  05/01/23 218 lb (98.9 kg)      Physical Exam Constitutional:      General: He is not in acute distress.    Appearance: Normal appearance.  HENT:     Head: Normocephalic and atraumatic.     Nose: Nose normal.     Mouth/Throat:     Mouth: Mucous membranes are moist.  Cardiovascular:     Rate and Rhythm: Normal rate and regular rhythm.     Heart sounds: Normal heart sounds. No murmur heard.    No gallop.  Pulmonary:     Effort: Pulmonary effort is normal. No respiratory distress.     Breath sounds: Normal breath sounds. No wheezing, rhonchi or rales.  Skin:    General: Skin is warm and dry.  Neurological:     Mental Status: He is alert and oriented to person, place, and time.    No results found for any visits on 12/25/23.    Assessment & Plan:  Dysphagia, unspecified type -     Ambulatory referral to Gastroenterology   Dysphagia for hamburger.  Advised to eat smaller pieces of meat and chew them well.  Also advised to drink liquids with meals.  Could consider PPI from not having daily heartburn symptoms.  Referral for GI placed.  Return if symptoms worsen or fail to improve.   Viola Greulich, MD

## 2023-12-25 NOTE — Patient Instructions (Addendum)
 A referral to the gastroenterologist was placed for you.  They will call you about setting this up.  If you haven't heard anything let us  know.

## 2024-01-02 ENCOUNTER — Emergency Department (HOSPITAL_COMMUNITY)
Admission: EM | Admit: 2024-01-02 | Discharge: 2024-01-03 | Disposition: A | Discharge status: Home / Self Care | Attending: Emergency Medicine | Admitting: Emergency Medicine

## 2024-01-02 ENCOUNTER — Other Ambulatory Visit: Payer: Self-pay

## 2024-01-02 ENCOUNTER — Encounter (HOSPITAL_COMMUNITY): Payer: Self-pay | Admitting: Emergency Medicine

## 2024-01-02 ENCOUNTER — Emergency Department (HOSPITAL_COMMUNITY)

## 2024-01-02 DIAGNOSIS — Z7982 Long term (current) use of aspirin: Secondary | ICD-10-CM | POA: Insufficient documentation

## 2024-01-02 DIAGNOSIS — R079 Chest pain, unspecified: Secondary | ICD-10-CM | POA: Diagnosis present

## 2024-01-02 LAB — CBC
HCT: 47.9 % (ref 39.0–52.0)
Hemoglobin: 15 g/dL (ref 13.0–17.0)
MCH: 31.3 pg (ref 26.0–34.0)
MCHC: 31.3 g/dL (ref 30.0–36.0)
MCV: 99.8 fL (ref 80.0–100.0)
Platelets: 354 10*3/uL (ref 150–400)
RBC: 4.8 MIL/uL (ref 4.22–5.81)
RDW: 14.6 % (ref 11.5–15.5)
WBC: 9 10*3/uL (ref 4.0–10.5)
nRBC: 0 % (ref 0.0–0.2)

## 2024-01-02 LAB — BASIC METABOLIC PANEL WITH GFR
Anion gap: 8 (ref 5–15)
BUN: 17 mg/dL (ref 8–23)
CO2: 19 mmol/L — ABNORMAL LOW (ref 22–32)
Calcium: 8.3 mg/dL — ABNORMAL LOW (ref 8.9–10.3)
Chloride: 113 mmol/L — ABNORMAL HIGH (ref 98–111)
Creatinine, Ser: 1.25 mg/dL — ABNORMAL HIGH (ref 0.61–1.24)
GFR, Estimated: >60 mL/min (ref >60)
Glucose, Bld: 97 mg/dL (ref 70–99)
Potassium: 3.9 mmol/L (ref 3.5–5.1)
Sodium: 140 mmol/L (ref 135–145)

## 2024-01-02 LAB — TROPONIN I (HIGH SENSITIVITY): Troponin I (High Sensitivity): 3 ng/L (ref <18)

## 2024-01-02 MED ORDER — FAMOTIDINE 20 MG PO TABS
40.0000 mg | ORAL_TABLET | Freq: Once | ORAL | Status: AC
  Administered 2024-01-02: 40 mg via ORAL
  Filled 2024-01-02: qty 2

## 2024-01-02 NOTE — ED Provider Triage Note (Signed)
 Emergency Medicine Provider Triage Evaluation Note  Raymond Norris , a 73 y.o. male  was evaluated in triage.  Pt complains of weird feeling in chest 2 hours ago, came and went, has also burpt 10 times, now back to normal. No externation CP or SOB  Review of Systems  Positive: CP Negative: Cough  Physical Exam  BP (!) 145/75 (BP Location: Left Arm)   Pulse 61   Temp 98.8 F (37.1 C) (Oral)   Resp 14   Ht 5\' 8"  (1.727 m)   Wt 98.8 kg   SpO2 98%   BMI 33.12 kg/m  Gen:   Awake, no distress   Resp:  Normal effort  MSK:   Moves extremities without difficulty  Other:  Radial pulse equal b/l  Medical Decision Making  Medically screening exam initiated at 6:23 PM.  Appropriate orders placed.  Raymond Norris was informed that the remainder of the evaluation will be completed by another provider, this initial triage assessment does not replace that evaluation, and the importance of remaining in the ED until their evaluation is complete.     Eudora Heron, [place] 01/02/24 1610

## 2024-01-02 NOTE — ED Provider Notes (Signed)
 [place] EMERGENCY DEPARTMENT AT [facility] Provider Note   CSN: [id number] Arrival date & time: 01/02/24  1757     History  Chief Complaint  Patient presents with   Chest Pain    Raymond Norris is a 73 y.o. male.  73 year old male presenting with chest pain.  Patient states around 5 PM while driving he experienced what he describes as a "light touch of pain" over his left chest without radiation; similar experience happened several days ago.  Patient took one 81 mg aspirin  and his symptoms were relieved shortly after.  He reports increased belching and gas since onset of his symptoms.  He denies chest tightness/pressure/squeezing, diaphoresis, nausea/vomiting/diarrhea, shortness of breath, cough, weakness, lower extremity edema.  History of NSTEMI in 2016 with stent placement, patient sees cardiologist annually with his last stress test in 2020.  He is compliant with his home medications.   Chest Pain      Home Medications Prior to Admission medications   Medication Sig Start Date End Date Taking? Authorizing Provider  amLODipine  (NORVASC ) 5 MG tablet TAKE 1 TABLET BY MOUTH EVERY DAY. Patient taking differently: Take 5 mg by mouth daily. 02/22/23   Tania Familia, NP  aspirin  EC 81 MG tablet Take 81 mg by mouth daily. Swallow whole.    [provider]  atorvastatin  (LIPITOR) 80 MG tablet TAKE 1 TABLET BY MOUTH DAILY. Patient taking differently: Take 80 mg by mouth daily. 02/22/23   Tania Familia, NP  cetirizine  (ZYRTEC ) 10 MG tablet Take 1 tablet (10 mg total) by mouth daily. 11/05/23   Zilphia Hilt, Charyl Coppersmith, MD  fluticasone  (FLONASE ) 50 MCG/ACT nasal spray PLACE 2 SPRAYS INTO EACH NOSTRIL ONCE DAILY FOR 1 MONTH THEN USE 1 SPRAY IN EACH NOSTRIL EVERY DAY 09/02/23   Zilphia Hilt, Charyl Coppersmith, MD  nitroGLYCERIN  (NITROSTAT ) 0.4 MG SL tablet Place 1 tablet (0.4 mg total) under the tongue every 5 (five) minutes x 3 doses as needed for chest pain.  12/25/23   Zilphia Hilt, Charyl Coppersmith, MD      Allergies    Patient has no known allergies.    Review of Systems   Review of Systems  Cardiovascular:  Positive for chest pain.    Physical Exam Updated Vital Signs BP (!) 155/103 (BP Location: Left Arm)   Pulse (!) 52   Temp 98.2 F (36.8 C) (Oral)   Resp 16   Ht 5\' 8"  (1.727 m)   Wt 98.4 kg   SpO2 98%   BMI 32.99 kg/m  Physical Exam Vitals and nursing note reviewed.  HENT:     Head: Normocephalic and atraumatic.  Eyes:     Extraocular Movements: Extraocular movements intact.     Pupils: Pupils are equal, round, and reactive to light.  Cardiovascular:     Rate and Rhythm: Normal rate and regular rhythm.     Pulses:          Carotid pulses are 2+ on the right side and 2+ on the left side.      Dorsalis pedis pulses are 2+ on the right side and 2+ on the left side.     Heart sounds: Normal heart sounds.  Pulmonary:     Effort: Pulmonary effort is normal.     Breath sounds: Normal breath sounds.  Abdominal:     Palpations: Abdomen is soft.     Tenderness: There is no abdominal tenderness.  Musculoskeletal:     Cervical back: Normal  range of motion.     Right lower leg: No edema.     Left lower leg: No edema.     Comments: 5/5 strength against resistance of bilateral upper and lower extremities Grip strength in-tact and equal bilaterally  Neurological:     Mental Status: He is alert and oriented to person, place, and time.    ED Results / Procedures / Treatments   Labs (all labs ordered are listed, but only abnormal results are displayed) Labs Reviewed  BASIC METABOLIC PANEL WITH GFR - Abnormal; Notable for the following components:      Result Value   Chloride 113 (*)    CO2 19 (*)    Creatinine, Ser 1.25 (*)    Calcium  8.3 (*)    All other components within normal limits  CBC  TROPONIN I (HIGH SENSITIVITY)  TROPONIN I (HIGH SENSITIVITY)    EKG EKG Interpretation Date/Time:  Thursday Jan 02 2024  18:05:26 EDT Ventricular Rate:  66 PR Interval:  165 QRS Duration:  101 QT Interval:  387 QTC Calculation: 406 R Axis:   -1  Text Interpretation: Sinus rhythm Abnormal R-wave progression, early transition Borderline T abnormalities, inferior leads When compared with ECG of 02/22/2023, No significant change was found Confirmed by Alissa April ([id number]) on 01/03/2024 12:01:58 AM  Radiology DG Chest 2 View Result Date: 01/02/2024 CLINICAL DATA:  Chest pain. EXAM: CHEST - 2 VIEW COMPARISON:  February 28, 2019. FINDINGS: The heart size and mediastinal contours are within normal limits. Both lungs are clear. The visualized skeletal structures are unremarkable. IMPRESSION: No active cardiopulmonary disease. Electronically Signed   By: Rosalene Colon M.D.   On: 01/02/2024 18:33    Procedures Procedures    Medications Ordered in ED Medications  famotidine  (PEPCID ) tablet 40 mg (has no administration in time range)    ED Course/ Medical Decision Making/ A&P                                 Medical Decision Making This patient presents to the ED for concern of chest pain, this involves an extensive number of treatment options, and is a complaint that carries with it a high risk of complications and morbidity.  The differential diagnosis includes ACS, stables vs unstable angina, GERD/indigestion, musculoskeletal pain.   Co morbidities that complicate the patient evaluation  H/o NSTEMI ~2016 with stent placement   Additional history obtained:  Additional history obtained from record review External records from outside source obtained and reviewed including most recent cardiology note   Lab Tests:  I Ordered, and personally interpreted labs.  The pertinent results include:  Initial troponin of 3, repeat of 4 - low suspicion for ACS. CBC unremarkable. BMP notable for Cr of 1.25, however this is stable as compared to patient's previous labs.    Imaging Studies ordered:  I ordered imaging  studies including chest XR  I independently visualized and interpreted imaging which showed no acute cardiopulmonary findings I agree with the radiologist interpretation   Cardiac Monitoring: / EKG:  The patient was maintained on a cardiac monitor.  I personally viewed and interpreted the cardiac monitored which showed an underlying rhythm of: sinus rhythm   Problem List / ED Course / Critical interventions / Medication management   I ordered medication including Pepcid   for indigestion  Reevaluation of the patient after these medicines showed that the patient improved I have reviewed the patients  home medicines and have made adjustments as needed   Social Determinants of Health:  Age    Test / Admission - Considered:  Physical exam is reassuring, normal S1/S2, lungs are clear with equal breath sounds bilaterally, no lower extremity edema. Labs/EKG/imaging reassuring as above. Patient's symptoms resolved prior to presentation to the Emergency Department. At this time I feel that patient's symptoms were likely attributed to indigestion, however given his cardiac history I would like him to follow-up with his cardiologist in the outpatient setting, would recommend stress testing as patient's last stress test was in 2020. HEART score of 4 places patient at moderate risk of adverse cardiac event based on his age and underlying risk factors.  At this time I feel the patient is appropriate for discharge. Discussed plan in depth with patient, he is agreeable. Return precautions discussed. Will place ambulatory referral to cardiology for further evaluation. Advised patient to follow-up with his PCP.   Staffed with attending physician Dr. Candelaria Chaco.     Amount and/or Complexity of Data Reviewed External Data Reviewed: notes.    Details: As above.  Labs: ordered.    Details: As above. Radiology: ordered.    Details: As above.  ECG/medicine tests: ordered.    Details: As above.             Final Clinical Impression(s) / ED Diagnoses Final diagnoses:  Chest pain, unspecified type    Rx / DC Orders ED Discharge Orders          Ordered    Ambulatory referral to Cardiology       Comments: If you have not heard from the Cardiology office within the next 72 hours please call [phone].   01/03/24 0031              Geralyn Knee, Angelita Kendall, PA-C 01/03/24 0125    Alissa April, MD 01/03/24 [phone]

## 2024-01-02 NOTE — ED Triage Notes (Signed)
 Patient c/o chest pain x 2 hours ago. Patient report chest pain went away after patient took 1 tablet of aspirin  81mg . Patient denies SOB. Patient denies N/V. Patient denies taking blood thinners.

## 2024-01-03 LAB — TROPONIN I (HIGH SENSITIVITY): Troponin I (High Sensitivity): 4 ng/L (ref <18)

## 2024-01-03 NOTE — Discharge Instructions (Signed)
 Return to the Emergency Department if you experience worsening symptoms, including chest pain, shortness of breath, nausea/vomiting, weakness. Follow-up with your PCP later this week. Follow-up with cardiology for additional evaluation and management. Continue your home medications as previously directed.

## 2024-01-06 ENCOUNTER — Telehealth: Payer: Self-pay | Admitting: Cardiology

## 2024-01-06 NOTE — Telephone Encounter (Signed)
 Patient calling to speak with the nurse about his ER visit. Please advise

## 2024-01-06 NOTE — Telephone Encounter (Signed)
 Patient identification verified by 2 forms.   Called and spoke to patient  Patient states:  -Cheat pain 1/10.  -Tenderness with pain in upper chest.  -Was advised to get stress test.              Interventions/Plan: -Pt will keep appointment -Pt will go to ED if symptoms continue/intensify.   Reviewed ED warning signs/precautions  Patient agrees with plan, no questions at this time

## 2024-01-14 ENCOUNTER — Other Ambulatory Visit: Payer: Self-pay | Admitting: Adult Health

## 2024-01-27 ENCOUNTER — Ambulatory Visit: Attending: Physician Assistant | Admitting: Physician Assistant

## 2024-01-27 ENCOUNTER — Encounter: Payer: Self-pay | Admitting: Physician Assistant

## 2024-01-27 VITALS — BP 122/80 | HR 71 | Ht 68.0 in | Wt 219.0 lb

## 2024-01-27 DIAGNOSIS — R079 Chest pain, unspecified: Secondary | ICD-10-CM | POA: Diagnosis not present

## 2024-01-27 DIAGNOSIS — I10 Essential (primary) hypertension: Secondary | ICD-10-CM

## 2024-01-27 DIAGNOSIS — E785 Hyperlipidemia, unspecified: Secondary | ICD-10-CM | POA: Diagnosis not present

## 2024-01-27 DIAGNOSIS — I25119 Atherosclerotic heart disease of native coronary artery with unspecified angina pectoris: Secondary | ICD-10-CM | POA: Diagnosis not present

## 2024-01-27 NOTE — Progress Notes (Signed)
 Cardiology Office Note   Date:  01/27/2024  ID:  Raymond Norris, DOB 1951/06/12, MRN [id number] PCP: Zilphia Hilt, Charyl Coppersmith, MD  [facility] Providers Cardiologist:  Alexandria Angel, MD     History of Present Illness Raymond Norris is a 73 y.o. male with past medical history of CAD, hypertension, and hyperlipidemia.  He had cardiac catheterization in June 2016 and had a 100% second marginal occlusion and 90% first marginal disease.  He underwent drug-eluting stents to both lesions, there was no obstructive disease seen in the right coronary artery or LAD.  EF was normal.  Abdominal ultrasound in December 2016 showed no aneurysm.  Heart monitor in June 2020 shows sinus rhythm with occasional PAC, PVC, and a brief PAT.  Nuclear study in June 2020 showed EF 46%, inferior infarct versus attenuation but no ischemia.  Patient was last seen by Dr. Audery Blazing in February 2023 at which time she he was doing well.  He was seen by Friddie Jetty, NP on 02/22/2023 at which time he continued to do well without any chest pain or shortness of breath. He underwent open right inguinal hernia repair in September 2024.  More recently, he presented to the ED 01/02/2024 with chest pain.  Serial troponin was negative x 2.  Creatinine 1.25.  Normal hemoglobin.  Chest x-ray normal.  EKG showed no acute ischemic changes, T wave inversion in the inferior leads.  Patient presents today for cardiology follow-up.  He states the symptoms started roughly 3 to 4 weeks ago, he described a left-sided chest discomfort accompanied by burping sensation.  Symptom is somewhat different from the previous anginal symptom from 2016.  Symptom also did not have direct correlation with exertion.  We we will proceed with PET stress test, if negative, patient can follow-up with Dr. Audery Blazing in 6 to 9 months.  ROS:   He denies palpitations, dyspnea, pnd, orthopnea, n, v, dizziness, syncope, edema, weight gain, or early satiety. All other  systems reviewed and are otherwise negative except as noted above.   Patient complains of intermittent chest discomfort  Studies Reviewed      Cardiac Studies & Procedures   ______________________________________________________________________________________________ CARDIAC CATHETERIZATION  CARDIAC CATHETERIZATION 01/31/2015  Conclusion 1. 2nd Mrg lesion, 100% stenosed. There is a 0% residual stenosis post intervention. 2. A drug-eluting stent was placed. 3. 1st Mrg lesion, 90% stenosed. There is a 0% residual stenosis post intervention. 4. A drug-eluting stent was placed.  FINAL CONCLUSIONS:  SEVERE SINGLE VESSEL CAD INVOLVING MULTIPLE BRANCHES OF THE LEFT CIRCUMFLEX WITH SUCCESSFUL PCI OF THE FIRST AND SECOND OM BRANCHES  MILD DIFFUSE NONOBSTRUCTIVE DISEASE INVOLVING THE RCA AND LAD  MODERATE DIAGONAL BRANCH STENOSIS  MILD SEGMENTAL LV CONTRACTION ABNORMALITY  RECOMMENDATIONS:  CONTINUE AGGRESSIVE MEDICAL MANAGEMENT  DAPT WITH ASA AND BRILINTA  X 12 MONTHS MINIMUM  Findings Coronary Findings Diagnostic  Dominance: Co-dominant  Left Main The vessel is angiographically normal.  Left Anterior Descending There is mildthe vessel.  First Diagonal Branch  Left Circumflex  discrete .  This lesion is in the distal circumflex supplying a small subbranches, appears appropriate for medical therapy rather than revascularization because of the small amount of myocardium supplied.  First Obtuse Marginal Branch discrete .  Second Obtuse Marginal Branch  Right Coronary Artery There is mildthe vessel.  Intervention  1st Mrg lesion PCI The pre-interventional distal flow is normal (TIMI 3). Pre-stent angioplasty was performed. A drug-eluting stent was placed. Post-stent angioplasty was performed. The post-interventional distal flow is normal (  TIMI 3). The intervention was successful. No complications occurred at this lesion. There is a 0% residual stenosis post  intervention.  2nd Mrg lesion PCI There is no pre-interventional antegrade distal flow (TIMI 0). Pre-stent angioplasty was performed. A drug-eluting stent was placed. No post-stent angioplasty was performed. The post-interventional distal flow is normal (TIMI 3). The intervention was successful. No complications occurred at this lesion. There is a 0% residual stenosis post intervention.   STRESS TESTS  MYOCARDIAL PERFUSION IMAGING 02/19/2019  Narrative  Nuclear stress EF: 46%.  The left ventricular ejection fraction is mildly decreased (45-54%).  No T wave inversion was noted during stress.  There was no ST segment deviation noted during stress.  Defect 1: There is a small defect of mild severity present in the basal inferior location.  This is an intermediate risk study.  Small size, mild intensity fixed basal to mid inferior wall perfusion defect, may represent artifact or scar. No reversible ischemia. LVEF 46% with basal to mid inferior hypokinesis. This is an intermediate risk study.      MONITORS  LONG TERM MONITOR (3-14 DAYS) 02/17/2019  Narrative Sinus with occasional PAC, PVC and brief PAT. Alexandria Angel       ______________________________________________________________________________________________      Risk Assessment/Calculations           Physical Exam VS:  BP 122/80   Pulse 71   Ht 5\' 8"  (1.727 m)   Wt 219 lb (99.3 kg)   SpO2 97%   BMI 33.30 kg/m    Wt Readings from Last 3 Encounters:  01/27/24 219 lb (99.3 kg)  01/02/24 217 lb (98.4 kg)  12/25/23 217 lb 12.8 oz (98.8 kg)    GEN: Well nourished, well developed in no acute distress NECK: No JVD; No carotid bruits CARDIAC: RRR, no murmurs, rubs, gallops RESPIRATORY:  Clear to auscultation without rales, wheezing or rhonchi  ABDOMEN: Soft, non-tender, non-distended EXTREMITIES:  No edema; No deformity   ASSESSMENT AND PLAN  Chest discomfort: Patient has been intermittent for the past  several weeks, no direct correlation with exertion.  Occasionally accompanied by burping.  Will proceed with PET stress test to rule out potential underlying disease  CAD: Denies any recent chest pain.  On aspirin  and statin  Hypertension: Blood pressure well-controlled  Hyperlipidemia: Continue atorvastatin  80 mg daily.    Informed Consent   Shared Decision Making/Informed Consent The risks [chest pain, shortness of breath, cardiac arrhythmias, dizziness, blood pressure fluctuations, myocardial infarction, stroke/transient ischemic attack, nausea, vomiting, allergic reaction, radiation exposure, metallic taste sensation and life-threatening complications (estimated to be 1 in 10,000)], benefits (risk stratification, diagnosing coronary artery disease, treatment guidance) and alternatives of a cardiac PET stress test were discussed in detail with Raymond Norris and he agrees to proceed.     Dispo: Follow-up with Dr. Audery Blazing in 6 to 9 months, earlier if PET stress test came back positive.  Signed, Ervin Heath, PA

## 2024-01-27 NOTE — Patient Instructions (Signed)
 Medication Instructions:  NO CHANGES *If you need a refill on your cardiac medications before your next appointment, please call your pharmacy*  Lab Work: NO LABS If you have labs (blood work) drawn today and your tests are completely normal, you will receive your results only by: MyChart Message (if you have MyChart) OR A paper copy in the mail If you have any lab test that is abnormal or we need to change your treatment, we will call you to review the results.  Testing/Procedures:    Please report to Radiology at the [facility] Main Entrance 30 minutes early for your test.  [address] [place], [place] [address]                      How to Prepare for Your Cardiac PET/CT Stress Test:  Nothing to eat or drink, except water, 3 hours prior to arrival time.  NO caffeine/decaffeinated products, or chocolate 12 hours prior to arrival. (Please note decaffeinated beverages (teas/coffees) still contain caffeine).  If you have caffeine within 12 hours prior, the test will need to be rescheduled.  Medication instructions: Do not take erectile dysfunction medications for 72 hours prior to test (sildenafil, tadalafil) Do not take nitrates (isosorbide mononitrate, Ranexa) the day before or day of test Do not take tamsulosin the day before or morning of test Hold theophylline containing medications for 12 hours. Hold Dipyridamole 48 hours prior to the test.  Diabetic Preparation: If able to eat breakfast prior to 3 hour fasting, you may take all medications, including your insulin. Do not worry if you miss your breakfast dose of insulin - start at your next meal. If you do not eat prior to 3 hour fast-Hold all diabetes (oral and insulin) medications. Patients who wear a continuous glucose monitor MUST remove the device prior to scanning.  You may take your remaining medications with water.  NO perfume, cologne or lotion on chest or abdomen area. FEMALES - Please avoid  wearing dresses to this appointment.  Total time is 1 to 2 hours; you may want to bring reading material for the waiting time.  In preparation for your appointment, medication and supplies will be purchased.  Appointment availability is limited, so if you need to cancel or reschedule, please call the Radiology Department Scheduler at [phone] 24 hours in advance to avoid a cancellation fee of $100.00  What to Expect When you Arrive:  Once you arrive and check in for your appointment, you will be taken to a preparation room within the Radiology Department.  A technologist or Nurse will obtain your medical history, verify that you are correctly prepped for the exam, and explain the procedure.  Afterwards, an IV will be started in your arm and electrodes will be placed on your skin for EKG monitoring during the stress portion of the exam. Then you will be escorted to the PET/CT scanner.  There, staff will get you positioned on the scanner and obtain a blood pressure and EKG.  During the exam, you will continue to be connected to the EKG and blood pressure machines.  A small, safe amount of a radioactive tracer will be injected in your IV to obtain a series of pictures of your heart along with an injection of a stress agent.    After your Exam:  It is recommended that you eat a meal and drink a caffeinated beverage to counter act any effects of the stress agent.  Drink plenty  of fluids for the remainder of the day and urinate frequently for the first couple of hours after the exam.  Your doctor will inform you of your test results within 7-10 business days.  For more information and frequently asked questions, please visit our website: [email or web address]  For questions about your test or how to prepare for your test, please call: Cardiac Imaging Nurse Navigators Office: [phone]   Follow-Up: At [facility], you and your health needs are our priority.  As part of  our continuing mission to provide you with exceptional heart care, our providers are all part of one team.  This team includes your primary Cardiologist (physician) and Advanced Practice Providers or APPs (Physician Assistants and Nurse Practitioners) who all work together to provide you with the care you need, when you need it.  Your next appointment:   6-9 month(s)  Provider:   Alexandria Angel, MD

## 2024-02-14 ENCOUNTER — Other Ambulatory Visit: Payer: Self-pay | Admitting: Adult Health

## 2024-02-26 ENCOUNTER — Encounter: Payer: Self-pay | Admitting: Nurse Practitioner

## 2024-02-26 ENCOUNTER — Ambulatory Visit: Admitting: Nurse Practitioner

## 2024-02-26 VITALS — BP 118/72 | HR 69 | Ht 68.0 in | Wt 217.0 lb

## 2024-02-26 DIAGNOSIS — Z860100 Personal history of colon polyps, unspecified: Secondary | ICD-10-CM | POA: Diagnosis not present

## 2024-02-26 DIAGNOSIS — R1311 Dysphagia, oral phase: Secondary | ICD-10-CM | POA: Diagnosis not present

## 2024-02-26 NOTE — Progress Notes (Signed)
 02/26/2024 Raymond Norris [id number] 02/17/1951   CHIEF COMPLAINT: Difficulty swallowing  HISTORY OF PRESENT ILLNESS: [name]. Onofre is a 73 year old male with a past medical history of hypertension, hyperlipidemia coronary artery disease s/p MI and DES x 2 in 2016 and GERD.  He presents to our office today as referred by Dr. Clotilda Single for further evaluation regarding dysphagia.  He endorses having dysphagia which started a few years ago and occurs sporadically.  He describes having difficulty swallowing hamburger which gets stuck to the left side of his throat and comes out and up into his mouth 1 to 2 days later.  He sometimes coughs and the stuck hamburger comes out.  He denies having any difficulty swallowing any other foods and no difficulty swallowing liquids or pills. No heartburn. No upper or lower abdominal pain.  He is passing normal formed brown bowel movements daily.  No rectal bleeding or black stools.  He takes ASA 81 mg daily.  No other NSAID use.  Never had an EGD.  He underwent a colonoscopy by Dr. Jakie 05/2008 which identified one 3mm polyp which was removed from the colon was not retrieved therefore biopsy was not done. He denies having any further colonoscopies since then.  No known family history of esophageal, gastric or colon cancer.   He has a history of CAD status post MI with stent placement in 2016. He was seen in the ED 01/02/2024 secondary to having chest pain. His last stress test was in 2020, last cardiac catheterization in 2016. His EKG was normal and troponin level was normal x 2. He has a prescription for nitroglycerin  at home.  His clinical status was stabilized and he was discharged home with instructions to follow-up with his cardiologist to consider repeat stress testing.  He was seen by [facility] pain cardiology PA-C 01/27/2024 and a PET stress test was scheduled 04/28/2024.     Latest Ref Rng & Units 01/02/2024    6:23 PM 04/22/2023    9:26 AM 03/12/2023     9:40 AM  CBC  WBC 4.0 - 10.5 K/uL 9.0  6.9  7.2   Hemoglobin 13.0 - 17.0 g/dL 84.9  85.1  84.3   Hematocrit 39.0 - 52.0 % 47.9  47.0  47.3   Platelets 150 - 400 K/uL 354  321  349         Latest Ref Rng & Units 01/02/2024    6:23 PM 04/22/2023    9:26 AM 03/12/2023    9:40 AM  CMP  Glucose 70 - 99 mg/dL 97  888  897   BUN 8 - 23 mg/dL 17  18  15    Creatinine 0.61 - 1.24 mg/dL 8.74  8.75  8.58   Sodium 135 - 145 mmol/L 140  142  143   Potassium 3.5 - 5.1 mmol/L 3.9  3.8  4.6   Chloride 98 - 111 mmol/L 113  109  108   CO2 22 - 32 mmol/L 19  22  23    Calcium  8.9 - 10.3 mg/dL 8.3  9.0  9.6   Total Protein 6.0 - 8.5 g/dL   6.7   Total Bilirubin 0.0 - 1.2 mg/dL   0.8   Alkaline Phos 44 - 121 IU/L   53   AST 0 - 40 IU/L   16   ALT 0 - 44 IU/L   19     Colonoscopy 06/07/2008 by Dr. Jakie. Findings - NORMAL EXAM: Transverse  Colon to Rectum. Not Seen: Polyps. AVM's. Colitis. Tumors. Crohn's. Diverticulosis. Hemorrhoids.  - POLYP: Transverse Colon, Maximum size: 3 mm. sessile polyp. Procedure:  snare with cautery, removed, not retrieved, ICD9: Colon Polyps: 211.3. Comments: NO TISSUE FOR EXAM....  - NORMAL EXAM: Cecum to Hepatic Flexure. Polyps. AVM's. Colitis. Tumors. Crohn's.   Past Medical History:  Diagnosis Date   Coronary artery disease    GERD 04/26/2008   Myocardial infarction [facility])    Past Surgical History:  Procedure Laterality Date   CARDIAC CATHETERIZATION N/A 01/31/2015   Procedure: Left Heart Cath and Coronary Angiography;  Surgeon: Ozell Fell, MD;  Location: [facility] INVASIVE CV LAB;  Service: Cardiovascular;  Laterality: N/A;   INGUINAL HERNIA REPAIR Right 05/01/2023   Procedure: OPEN RIGHT INGUINAL HERNIA REPAIR WITH MESH;  Surgeon: Signe Mitzie LABOR, MD;  Location: [facility] ORS;  Service: General;  Laterality: Right;   PERCUTANEOUS CORONARY STENT INTERVENTION (PCI-S)  01/2015   LEFT CIRCUMFLEX  1ST & 2ND   OM BRANCHES   Social History: Married. He has one son. Nonsmoker.  No alcohol use since age 15.  No drug use.  Family History: No known family history of esophageal, gastric or colon cancer.  No Known Allergies    Outpatient Encounter Medications as of 02/26/2024  Medication Sig   amLODipine  (NORVASC ) 5 MG tablet TAKE 1 TABLET BY MOUTH EVERY DAY   aspirin  EC 81 MG tablet Take 81 mg by mouth daily. Swallow whole.   atorvastatin  (LIPITOR) 80 MG tablet TAKE 1 TABLET BY MOUTH DAILY. (Patient taking differently: Take 80 mg by mouth daily.)   cetirizine  (ZYRTEC ) 10 MG tablet Take 1 tablet (10 mg total) by mouth daily.   fluticasone  (FLONASE ) 50 MCG/ACT nasal spray PLACE 2 SPRAYS INTO EACH NOSTRIL ONCE DAILY FOR 1 MONTH THEN USE 1 SPRAY IN EACH NOSTRIL EVERY DAY   nitroGLYCERIN  (NITROSTAT ) 0.4 MG SL tablet Place 1 tablet (0.4 mg total) under the tongue every 5 (five) minutes x 3 doses as needed for chest pain.   No facility-administered encounter medications on file as of 02/26/2024.   REVIEW OF SYSTEMS:  Gen: Denies fever, sweats or chills. No weight loss.  CV: Denies chest pain, palpitations or edema. Resp: Denies cough, shortness of breath of hemoptysis.  GI: See HPI. GU: Denies urinary burning, blood in urine, increased urinary frequency or incontinence. MS: Denies joint pain, muscles aches or weakness. Derm: Denies rash, itchiness, skin lesions or unhealing ulcers. Psych: Denies depression, anxiety, memory loss or confusion. Heme: Denies bruising, easy bleeding. Neuro:  Denies headaches, dizziness or paresthesias. Endo:  Denies any problems with DM, thyroid or adrenal function.  PHYSICAL EXAM: Ht 5' 8 (1.727 m)   Wt 217 lb (98.4 kg)   BMI 32.99 kg/m  General: 73 year old male in no acute distress. Head: Normocephalic and atraumatic. Eyes:  Sclerae non-icteric, conjunctive pink. Ears: Normal auditory acuity. Mouth: Dentition intact. No ulcers or lesions.  Neck: Supple, no lymphadenopathy or thyromegaly.  Lungs: Clear bilaterally to auscultation  without wheezes, crackles or rhonchi. Heart: Regular rate and rhythm. No murmur, rub or gallop appreciated.  Abdomen: Soft, nontender, nondistended. No masses. No hepatosplenomegaly. Normoactive bowel sounds x 4 quadrants.  Rectal: Deferred. Musculoskeletal: Symmetrical with no gross deformities. Skin: Warm and dry. No rash or lesions on visible extremities. Extremities: No edema. Neurological: Alert oriented x 4, no focal deficits.  Psychological: Alert and cooperative. Normal mood and affect.  ASSESSMENT AND PLAN:  73 year old male with oral phase dysphagia which occurs only  when eating hamburger. No GERD symptoms.  -Barium swallow study with tablet, rule out esophageal diverticulum and esophageal stricture -Await barium swallow study results, if abnormal, patient will require cardiac clearance prior to scheduling an EGG -Consider ENT consult if barium swallow study unrevealing -Patient instructed to avoid hamburgers as this is the only food trigger   Colon cancer screening. Colonoscopy 05/2008 identified one 3 mm polyp removed from the transverse colon, no tissue retrieved for biopsy. -Patient to follow up in office 04/2024, after he completes his PET stress test, to schedule a screening colonoscopy   CAD s/p MI and DES x 2 in 2016. Evaluated in the ED secondary to having chest pain 12/2023. Recently seen by cardiology PA and PET stress test was scheduled 04/28/2024    CC:  Raymond Norris, [place]*

## 2024-02-26 NOTE — Patient Instructions (Signed)
 You have been scheduled for a Barium Esophogram at [facility] Radiology (1st floor of the hospital) on 03/16/24 at 10:00am. Please arrive 30 minutes prior to your appointment for registration. Make certain not to have anything to eat or drink 3 hours prior to your test. If you need to reschedule for any reason, please contact radiology at [phone] to do so. __________________________________________________________________ A barium swallow is an examination that concentrates on views of the esophagus. This tends to be a double contrast exam (barium and two liquids which, when combined, create a gas to distend the wall of the oesophagus) or single contrast (non-ionic iodine based). The study is usually tailored to your symptoms so a good history is essential. Attention is paid during the study to the form, structure and configuration of the esophagus, looking for functional disorders (such as aspiration, dysphagia, achalasia, motility and reflux) EXAMINATION You may be asked to change into a gown, depending on the type of swallow being performed. A radiologist and radiographer will perform the procedure. The radiologist will advise you of the type of contrast selected for your procedure and direct you during the exam. You will be asked to stand, sit or lie in several different positions and to hold a small amount of fluid in your mouth before being asked to swallow while the imaging is performed .In some instances you may be asked to swallow barium coated marshmallows to assess the motility of a solid food bolus. The exam can be recorded as a digital or video fluoroscopy procedure. POST PROCEDURE It will take 1-2 days for the barium to pass through your system. To facilitate this, it is important, unless otherwise directed, to increase your fluids for the next 24-48hrs and to resume your normal diet.  This test typically takes about 30 minutes to  perform. __________________________________________________________________________________    If your blood pressure at your visit was 140/90 or greater, please contact your primary care physician to follow up on this.  _______________________________________________________  If you are age 74 or older, your body mass index should be between 23-30. Your Body mass index is 32.99 kg/m. If this is out of the aforementioned range listed, please consider follow up with your Primary Care Provider.  If you are age 8 or younger, your body mass index should be between 19-25. Your Body mass index is 32.99 kg/m. If this is out of the aformentioned range listed, please consider follow up with your Primary Care Provider.   ________________________________________________________  The [facility] providers would like to encourage you to use MYCHART to communicate with providers for non-urgent requests or questions.  Due to long hold times on the telephone, sending your provider a message by [facility] may be a faster and more efficient way to get a response.  Please allow 48 business hours for a response.  Please remember that this is for non-urgent requests.  _______________________________________________________

## 2024-02-26 NOTE — Progress Notes (Signed)
 Noted

## 2024-03-09 ENCOUNTER — Other Ambulatory Visit: Payer: Self-pay | Admitting: Adult Health

## 2024-03-16 ENCOUNTER — Inpatient Hospital Stay (HOSPITAL_COMMUNITY): Admission: RE | Admit: 2024-03-16 | Source: Ambulatory Visit

## 2024-04-08 ENCOUNTER — Ambulatory Visit (HOSPITAL_COMMUNITY)
Admission: RE | Admit: 2024-04-08 | Discharge: 2024-04-08 | Disposition: A | Discharge status: Home / Self Care | Source: Ambulatory Visit | Attending: Nurse Practitioner | Admitting: Nurse Practitioner

## 2024-04-08 ENCOUNTER — Ambulatory Visit: Payer: Self-pay | Admitting: Nurse Practitioner

## 2024-04-08 DIAGNOSIS — R1311 Dysphagia, oral phase: Secondary | ICD-10-CM | POA: Insufficient documentation

## 2024-04-23 ENCOUNTER — Encounter (HOSPITAL_COMMUNITY): Payer: Self-pay

## 2024-04-24 ENCOUNTER — Telehealth (HOSPITAL_COMMUNITY): Payer: Self-pay | Admitting: *Deleted

## 2024-04-24 NOTE — Telephone Encounter (Signed)
 Attempted to call patient regarding upcoming cardiac PET appointment. Left message on voicemail with name and callback number  Raymond Brick RN Navigator Cardiac Imaging [facility] [phone] Office [phone] Cell  Reminder to avoid caffeine 12 hours prior to his cardiac PET appt.

## 2024-04-28 ENCOUNTER — Ambulatory Visit (HOSPITAL_COMMUNITY)
Admission: RE | Admit: 2024-04-28 | Discharge: 2024-04-28 | Disposition: A | Discharge status: Home / Self Care | Source: Ambulatory Visit | Attending: Physician Assistant | Admitting: Physician Assistant

## 2024-04-28 DIAGNOSIS — R079 Chest pain, unspecified: Secondary | ICD-10-CM | POA: Diagnosis present

## 2024-04-28 LAB — NM PET CT CARDIAC PERFUSION MULTI W/ABSOLUTE BLOODFLOW
MBFR: 3.14
Nuc Rest EF: 48 %
Nuc Stress EF: 58 %
Rest MBF: 0.74 ml/g/min
Rest Nuclear Isotope Dose: 25.6 mCi
ST Depression (mm): 0 mm
Stress MBF: 2.32 ml/g/min
Stress Nuclear Isotope Dose: 25.2 mCi

## 2024-04-28 MED ORDER — REGADENOSON 0.4 MG/5ML IV SOLN
INTRAVENOUS | Status: AC
  Filled 2024-04-28: qty 5

## 2024-04-28 MED ORDER — REGADENOSON 0.4 MG/5ML IV SOLN
0.4000 mg | Freq: Once | INTRAVENOUS | Status: AC
  Administered 2024-04-28: 0.4 mg via INTRAVENOUS

## 2024-04-28 MED ORDER — RUBIDIUM RB82 GENERATOR (RUBYFILL)
25.6000 | PACK | Freq: Once | INTRAVENOUS | Status: AC
  Administered 2024-04-28: 25.6 via INTRAVENOUS

## 2024-04-28 MED ORDER — RUBIDIUM RB82 GENERATOR (RUBYFILL)
25.2000 | PACK | Freq: Once | INTRAVENOUS | Status: AC
  Administered 2024-04-28: 25.2 via INTRAVENOUS

## 2024-04-28 NOTE — Progress Notes (Signed)
 Pt. Tolerated lexi well.

## 2024-04-30 ENCOUNTER — Ambulatory Visit: Payer: Self-pay | Admitting: Physician Assistant

## 2024-05-11 ENCOUNTER — Ambulatory Visit: Admitting: Nurse Practitioner

## 2024-05-15 ENCOUNTER — Other Ambulatory Visit: Payer: Self-pay | Admitting: Internal Medicine

## 2024-05-15 DIAGNOSIS — R0982 Postnasal drip: Secondary | ICD-10-CM

## 2024-07-08 NOTE — Progress Notes (Addendum)
 "    07/08/2024 Raymond Norris [id number] 24-May-1951   Chief Complaint: Follow-up barium swallow study, discuss scheduling a colonoscopy  History of Present Illness: Raymond Norris. Bellin is a 73 year old male with a past medical history of hypertension, hyperlipidemia coronary artery disease s/p MI and DES x 2 in 2016 and GERD.   I initially saw patient in office 02/26/2024 due to having dysphagia. At that time,  he described having difficulty swallowing hamburger which gets stuck to the left side of his throat and comes out and up into his mouth 1 to 2 days later.  He sometimes coughs and the stuck hamburger comes out. He denies having any difficulty swallowing any other foods and no difficulty swallowing liquids or pills. No heartburn. No upper or lower abdominal pain.  Never had an EGD.  A barium swallow study was ordered.  Discussed the use of AI scribe software for clinical note transcription with the patient, who gave verbal consent to proceed 06/08/2024.  History of Present Illness Raymond Norris is a 73 year old male who presents with difficulty swallowing, specifically with hamburger.  He experiences dysphagia, triggered only  by eating hamburger. 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  He presents today for further follow-up. The dysphagia occurs only with hamburger and not with other foods. Drinking chocolate milk alleviates the sensation of food getting stuck. He has since avoided eating hamburger and reports no further episodes of dysphagia.  He denies having any upper or lower abdominal pain.  He passes a normal formed brown bowel movement daily.  No rectal bleeding or black stools. He underwent a colonoscopy by Dr. Jakie 05/2008 which identified one 3mm polyp which was removed from the colon was not retrieved therefore biopsy was not done. He denies having any further colonoscopies since  then. No known family history of esophageal, gastric or colon cancer.   As previously reviewed, he has a history of CAD status post MI with stent placement in 2016.  He was seen in the ED 01/02/2024 secondary to having chest pain. His last stress test was in 2020, last cardiac catheterization in 2016. His EKG was normal and troponin level was normal x 2 therefore he was discharged home. He was seen by his cardiology PA-C 01/27/2024 and a PET stress test was completed 04/28/2024 which showed rest LV EF 48% and stress EF 58%.  This study showed normal perfusion, intermediate risk only due to resting decrease in LVEF that improved with stress.  He denies having any chest pain since he was seen in the ED 12/2023.  No cough, shortness of breath or DOE.        Latest Ref Rng & Units 01/02/2024    6:23 PM 04/22/2023    9:26 AM 03/12/2023    9:40 AM  CBC  WBC 4.0 - 10.5 K/uL 9.0  6.9  7.2   Hemoglobin 13.0 - 17.0 g/dL 84.9  85.1  84.3   Hematocrit 39.0 - 52.0 % 47.9  47.0  47.3   Platelets 150 - 400 K/uL 354  321  349        Latest Ref Rng & Units 01/02/2024    6:23 PM 04/22/2023    9:26 AM 03/12/2023    9:40 AM  CMP  Glucose 70 - 99 mg/dL 97  888  897   BUN 8 - 23 mg/dL 17  18  15    Creatinine 0.61 -  1.24 mg/dL 8.74  8.75  8.58   Sodium 135 - 145 mmol/L 140  142  143   Potassium 3.5 - 5.1 mmol/L 3.9  3.8  4.6   Chloride 98 - 111 mmol/L 113  109  108   CO2 22 - 32 mmol/L 19  22  23    Calcium  8.9 - 10.3 mg/dL 8.3  9.0  9.6   Total Protein 6.0 - 8.5 g/dL   6.7   Total Bilirubin 0.0 - 1.2 mg/dL   0.8   Alkaline Phos 44 - 121 IU/L   53   AST 0 - 40 IU/L   16   ALT 0 - 44 IU/L   19     Barium Swallow Study 04/08/2024: FINDINGS: Swallowing: Appears normal. No vestibular penetration or aspiration seen.   Pharynx: Unremarkable.   Esophagus: Normal appearance. Very small amount of residual contrast in the upper esophagus between boluses which is cleared with additional swallows potentially indicating  tiny Zenker's diverticulum (series 3, images 56-66 and series 4, images 24-40)   Esophageal motility: Within normal limits.   Hiatal Hernia: None.   Gastroesophageal reflux: None visualized.   Ingested 13 mm barium tablet: Passed normally - patient required thick barium in order to swallow tablet.   Other: None.   IMPRESSION: Double contrast esophagram significant for potential tiny Zenker's diverticulum. Consider endoscopy if further evaluation is desired.  Colonoscopy 06/07/2008 by Dr. Jakie. Findings - NORMAL EXAM: Transverse Colon to Rectum. Not Seen: Polyps. AVM's. Colitis. Tumors. Crohn's. Diverticulosis. Hemorrhoids.  - POLYP: Transverse Colon, Maximum size: 3 mm. sessile polyp. Procedure:  snare with cautery, removed, not retrieved, ICD9: Colon Polyps: 211.3. Comments: NO TISSUE FOR EXAM....  - NORMAL EXAM: Cecum to Hepatic Flexure. Polyps. AVM's. Colitis. Tumors. Crohn's.    Nuclear Med PET cardiac 04/28/2024:   LV perfusion is normal.   Rest left ventricular function is abnormal. Rest global function is mildly reduced. Rest EF: 48%. Stress left ventricular function is normal. Stress EF: 58%. End diastolic cavity size is normal. End systolic cavity size is normal.   Myocardial blood flow was computed to be 0.64ml/g/min at rest and 2.32ml/g/min at stress. Global myocardial blood flow reserve was 3.14 and was normal.   Coronary calcium  assessment not performed due to prior revascularization.  Aortic atherosclerosis noted.   The study shows normal perfusion. The study is intermediate risk only due to resting decrease in LVEF that improved with stress.   Electronically Signed  By: Stanly Leavens M.D.   Current Outpatient Medications on File Prior to Visit  Medication Sig Dispense Refill   amLODipine  (NORVASC ) 5 MG tablet TAKE 1 TABLET BY MOUTH EVERY DAY 30 tablet 11   aspirin  EC 81 MG tablet Take 81 mg by mouth daily. Swallow whole.     atorvastatin  (LIPITOR) 80  MG tablet TAKE 1 TABLET BY MOUTH DAILY 90 tablet 3   cetirizine  (ZYRTEC ) 10 MG tablet TAKE 1 TABLET(10 MG) BY MOUTH DAILY 90 tablet 0   fluticasone  (FLONASE ) 50 MCG/ACT nasal spray PLACE 2 SPRAYS INTO EACH NOSTRIL ONCE DAILY FOR 1 MONTH THEN USE 1 SPRAY IN EACH NOSTRIL EVERY DAY 16 g 1   nitroGLYCERIN  (NITROSTAT ) 0.4 MG SL tablet Place 1 tablet (0.4 mg total) under the tongue every 5 (five) minutes x 3 doses as needed for chest pain. 25 tablet 12   No current facility-administered medications on file prior to visit.   No Known Allergies  Current Medications, Allergies, Past Medical History, Past Surgical  History, Family History and Social History were reviewed in [employer] record.  Review of Systems:   Constitutional: Negative for fever, sweats, chills or weight loss.  Respiratory: Negative for shortness of breath.   Cardiovascular: Negative for chest pain, palpitations and leg swelling.  Gastrointestinal: See HPI.  Musculoskeletal: Negative for back pain or muscle aches.  Neurological: Negative for dizziness, headaches or paresthesias.   Physical Exam: BP 130/80   Pulse 67   Ht 5' 8 (1.727 m)   Wt 213 lb (96.6 kg)   BMI 32.39 kg/m   Wt Readings from Last 3 Encounters:  07/09/24 213 lb (96.6 kg)  02/26/24 217 lb (98.4 kg)  01/27/24 219 lb (99.3 kg)    General: 73 year old male in no acute distress. Head: Normocephalic and atraumatic. Eyes: No scleral icterus. Conjunctiva pink . Ears: Normal auditory acuity. Mouth: Dentition intact. No ulcers or lesions.  Lungs: Clear throughout to auscultation. Heart: Regular rate and rhythm, no murmur. Abdomen: Soft, nontender and nondistended. No masses or hepatomegaly. Normal bowel sounds x 4 quadrants.  Rectal: Deferred. Musculoskeletal: Symmetrical with no gross deformities. Extremities: Trace edema right ankle. Neurological: Alert oriented x 4. No focal deficits.  Psychological: Alert and cooperative. Normal  mood and affect  Assessment and Recommendations:  73 year old male with oral phase dysphagia which occurs only when eating hamburger. No GERD symptoms.  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 - Patient does not wish to pursue an EGD as his dysphagia symptoms occur only when eating hamburger and does not occur if he drinks chocolate milk while he eats hamburger.  He has elected to avoid hamburger at this point.  He agreed to contact our office if he has any future difficulty swallowing foods, liquids or pills.   Colon cancer screening. Colonoscopy 05/2008 identified one 3 mm polyp removed from the transverse colon, no tissue retrieved for biopsy. - Colonoscopy benefits and risks discussed including risk with sedation, risk of bleeding, perforation and infection   CAD status post MI with stent placement in 2016.  PET stress test 04/28/2024 showed rest LV EF 48% and stress LV EF 58%, normal perfusion, intermediate risk only due to resting decrease in LVEF that improved with stress. No further chest pain since he was seen in the ED 12/2023.  - Patient underwent cardiac PET scan, patient has not reviewed results yet with cardiology.  Request cardiac clearance prior to the patient proceeding with a colonoscopy.   ADDENDUM: Refer to phone note 07/10/2024.  Dr. Abran recommended EGD at time of colonoscopy. See Dr. Nancyann addendum note.   ADDENDUM: Cardiac clearance per Damien Braver cardiology NP received 07/29/2024 as follows: 1.  Preoperative Cardiovascular Risk Assessment:   According to the Revised Cardiac Risk Index (RCRI), his Perioperative Risk of Major Cardiac Event is (%): 0.9. His Functional Capacity in METs is: 5.72 according to the Duke Activity Status Index (DASI). Therefore, based on ACC/AHA guidelines, patient would be at acceptable risk for the planned procedure without further cardiovascular testing.   The patient was advised that if he develops new  symptoms prior to surgery to contact our office to arrange for a follow-up visit, and he verbalized understanding.   Regarding ASA therapy, we recommend continuation of ASA throughout the perioperative period.  However, if the surgeon feels that cessation of ASA is required in the perioperative period, it may be stopped 5-7 days prior to surgery with a plan to resume it as soon as felt to be  feasible from a surgical standpoint in the post-operative period.    "

## 2024-07-09 ENCOUNTER — Ambulatory Visit (INDEPENDENT_AMBULATORY_CARE_PROVIDER_SITE_OTHER): Admitting: Nurse Practitioner

## 2024-07-09 ENCOUNTER — Telehealth: Payer: Self-pay

## 2024-07-09 ENCOUNTER — Encounter: Payer: Self-pay | Admitting: Nurse Practitioner

## 2024-07-09 VITALS — BP 130/80 | HR 67 | Ht 68.0 in | Wt 213.0 lb

## 2024-07-09 DIAGNOSIS — Z860100 Personal history of colon polyps, unspecified: Secondary | ICD-10-CM | POA: Diagnosis not present

## 2024-07-09 MED ORDER — NA SULFATE-K SULFATE-MG SULF 17.5-3.13-1.6 GM/177ML PO SOLN: 1.0000 | Freq: Once | ORAL | 0 refills | Status: AC

## 2024-07-09 NOTE — Patient Instructions (Addendum)
 You have been scheduled for a colonoscopy with Dr. Abran. Please follow written instructions given to you at your visit today.   If you use inhalers (even only as needed), please bring them with you on the day of your procedure.  DO NOT TAKE 7 DAYS PRIOR TO TEST- Trulicity (dulaglutide) Ozempic, Wegovy (semaglutide) Mounjaro, Zepbound (tirzepatide) Bydureon Bcise (exanatide extended release)  DO NOT TAKE 1 DAY PRIOR TO YOUR TEST Rybelsus (semaglutide) Adlyxin (lixisenatide) Victoza (liraglutide) Byetta (exanatide) ___________________________________________________________________________  We will request Cardiac Clearance from Dr. Pietro.  Thank you for entrusting me with your care and for choosing [employer], [place], CRNP   _______________________________________________________  If your blood pressure at your visit was 140/90 or greater, please contact your primary care physician to follow up on this.  _______________________________________________________  If you are age 35 or older, your body mass index should be between 23-30. Your Body mass index is 32.39 kg/m. If this is out of the aforementioned range listed, please consider follow up with your Primary Care Provider.  If you are age 76 or younger, your body mass index should be between 19-25. Your Body mass index is 32.39 kg/m. If this is out of the aformentioned range listed, please consider follow up with your Primary Care Provider.   ________________________________________________________  The [facility] providers would like to encourage you to use MYCHART to communicate with providers for non-urgent requests or questions.  Due to long hold times on the telephone, sending your provider a message by [facility] may be a faster and more efficient way to get a response.  Please allow 48 business hours for a response.  Please remember that this is for non-urgent requests.   _______________________________________________________  [facility] is using a team-based approach to care.  Your team is made up of your doctor and two to three APPS. Our APPS (Nurse Practitioners and Physician Assistants) work with your physician to ensure care continuity for you. They are fully qualified to address your health concerns and develop a treatment plan. They communicate directly with your gastroenterologist to care for you. Seeing the Advanced Practice Practitioners on your physician's team can help you by facilitating care more promptly, often allowing for earlier appointments, access to diagnostic testing, procedures, and other specialty referrals.

## 2024-07-09 NOTE — Telephone Encounter (Signed)
 Left message to call back to schedule tele pre op appt.

## 2024-07-09 NOTE — Progress Notes (Signed)
 Raymond Norris, I attempted to contact patient at this time, he did not answer and I did not leave a message on his voice mail. Pls try to contact him and let him know Dr. 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 I will gladly explain further to patient it he has further questions.  THX

## 2024-07-09 NOTE — Progress Notes (Signed)
 Raymond Norris, Reviewed 1.  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 2.  He should undergo upper endoscopy in addition to his colonoscopy 3.  If no other abnormalities identified, then he may be referred to a tertiary center for possible endoscopic myotomy Dr. Abran

## 2024-07-09 NOTE — Telephone Encounter (Signed)
 Called patient and left detailed message with Dr. Nancyann recommendations to have EGD with the colonoscopy. (The procedure has been changed to an ECL).  Asked him to call back to confirm understanding and to answer any questions he has.  Elida is happy to speak with him further if he would like to discuss. Otherwise his instructions don't change, arrival date and time the same.

## 2024-07-09 NOTE — Telephone Encounter (Signed)
   Name: Raymond Norris  DOB: Aug 12, 1951  MRN: [id number]  Primary Cardiologist: Redell Shallow, MD   Preoperative team, please contact this patient and set up a phone call appointment for further preoperative risk assessment. Please obtain consent and complete medication review. Thank you for your help.  I confirm that guidance regarding antiplatelet and oral anticoagulation therapy has been completed and, if necessary, noted below.  None requested.  I also confirmed the patient resides in the state of [place] . As per [facility] Medical Board telemedicine laws, the patient must reside in the state in which the provider is licensed.   Josefa CHRISTELLA Beauvais, NP 07/09/2024, 4:28 PM [facility]

## 2024-07-09 NOTE — Telephone Encounter (Signed)
 [facility] Pre-operative Risk Assessment     Request for surgical clearance:     Endoscopy Procedure  What type of surgery is being performed?     COLONOSCOPY  When is this surgery scheduled?     08-28-2024  What type of clearance is required ? CARDIAC  Are there any medications that need to be held prior to surgery and how long? NO  Practice name and name of physician performing surgery?   DR. NORLEEN KIANG   [facility]  What is your office phone and fax number?      Phone- [phone]  Fax- [phone]  Anesthesia type (None, local, MAC, general) ?     MAC THANK YOU  Please route your response to Etan Vasudevan, CMA

## 2024-07-10 ENCOUNTER — Telehealth: Payer: Self-pay

## 2024-07-10 NOTE — Telephone Encounter (Signed)
 Called and left another message for patient re: Dr. Nancyann recommendation to do EGD with the colonoscopy. Asked him to call back to confirm understanding and let us  know if he has any questions.

## 2024-07-10 NOTE — Telephone Encounter (Signed)
 Called patient to schedule a televisit appointment for a pre-op clearance on 07/29/24 @ 9:00, Meds, Rec, and consent done.      Patient Consent for Virtual Visit        Raymond Norris has provided verbal consent on 07/10/2024 for a virtual visit (video or telephone).   CONSENT FOR VIRTUAL VISIT FOR:  Raymond Norris  By participating in this virtual visit I agree to the following:  I hereby voluntarily request, consent and authorize [facility] and its employed or contracted physicians, physician assistants, nurse practitioners or other licensed health care professionals (the Practitioner), to provide me with telemedicine health care services (the "Services) as deemed necessary by the treating Practitioner. I acknowledge and consent to receive the Services by the Practitioner via telemedicine. I understand that the telemedicine visit will involve communicating with the Practitioner through live audiovisual communication technology and the disclosure of certain medical information by electronic transmission. I acknowledge that I have been given the opportunity to request an in-person assessment or other available alternative prior to the telemedicine visit and am voluntarily participating in the telemedicine visit.  I understand that I have the right to withhold or withdraw my consent to the use of telemedicine in the course of my care at any time, without affecting my right to future care or treatment, and that the Practitioner or I may terminate the telemedicine visit at any time. I understand that I have the right to inspect all information obtained and/or recorded in the course of the telemedicine visit and may receive copies of available information for a reasonable fee.  I understand that some of the potential risks of receiving the Services via telemedicine include:  Delay or interruption in medical evaluation due to technological equipment failure or disruption; Information  transmitted may not be sufficient (e.g. poor resolution of images) to allow for appropriate medical decision making by the Practitioner; and/or  In rare instances, security protocols could fail, causing a breach of personal health information.  Furthermore, I acknowledge that it is my responsibility to provide information about my medical history, conditions and care that is complete and accurate to the best of my ability. I acknowledge that Practitioner's advice, recommendations, and/or decision may be based on factors not within their control, such as incomplete or inaccurate data provided by me or distortions of diagnostic images or specimens that may result from electronic transmissions. I understand that the practice of medicine is not an exact science and that Practitioner makes no warranties or guarantees regarding treatment outcomes. I acknowledge that a copy of this consent can be made available to me via my patient portal [facility] MyChart), or I can request a printed copy by calling the office of [facility].    I understand that my insurance will be billed for this visit.   I have read or had this consent read to me. I understand the contents of this consent, which adequately explains the benefits and risks of the Services being provided via telemedicine.  I have been provided ample opportunity to ask questions regarding this consent and the Services and have had my questions answered to my satisfaction. I give my informed consent for the services to be provided through the use of telemedicine in my medical care

## 2024-07-10 NOTE — Telephone Encounter (Signed)
 Called patient to schedule a televisit appointment for a pre-op clearance on 07/29/24 @ 9:00, Meds, Rec, and consent done.

## 2024-07-14 NOTE — Telephone Encounter (Signed)
 Patient states he will call the office on 12/3 to further discuss upcoming apt scheduled.

## 2024-07-29 ENCOUNTER — Ambulatory Visit: Attending: Cardiology | Admitting: Nurse Practitioner

## 2024-07-29 DIAGNOSIS — Z01810 Encounter for preprocedural cardiovascular examination: Secondary | ICD-10-CM

## 2024-07-29 NOTE — Progress Notes (Addendum)
 Virtual Visit via Telephone Note   Because of Raymond Norris co-morbid illnesses, he is at least at moderate risk for complications without adequate follow up.  This format is felt to be most appropriate for this patient at this time.  Due to technical limitations with video connection (technology), today's appointment will be conducted as an audio only telehealth visit, and Raymond Norris verbally agreed to proceed in this manner.   All issues noted in this document were discussed and addressed.  No physical exam could be performed with this format.  Evaluation Performed:  Preoperative cardiovascular risk assessment _____________   Date:  07/29/2024   Patient ID:  Raymond Norris, DOB Oct 01, 1950, MRN [id number] Patient Location:  Home Provider location:   Office  Primary Care Provider:  Theophilus Norris, Raymond GRADE, MD Primary Cardiologist:  Raymond Shallow, MD  Chief Complaint / Patient Profile   73 y.o. y/o male with a h/o CAD s/p DES-OM1 and OM2 in 2016, hypertension, hyperlipidemia, and GERD who is pending EGD/colonoscopy on 08/28/2024 with Dr. Norleen Norris of [facility] and presents today for telephonic preoperative cardiovascular risk assessment.  History of Present Illness    Raymond Norris is a 73 y.o. male who presents via web designer for a telehealth visit today.  Pt was last seen in cardiology clinic on 01/27/2024 by Raymond Ford, PA-C. At that time Raymond Norris was doing well.  The patient is now pending procedure as outlined above. Since his last visit, he has done well from a cardiac standpoint.   He denies chest pain, palpitations, dyspnea, pnd, orthopnea, n, v, dizziness, syncope, edema, weight gain, or early satiety. All other systems reviewed and are otherwise negative except as noted above.   Past Medical History    Past Medical History:  Diagnosis Date   Coronary artery disease    GERD 04/26/2008   Myocardial infarction [facility])    Past Surgical History:   Procedure Laterality Date   CARDIAC CATHETERIZATION N/A 01/31/2015   Procedure: Left Heart Cath and Coronary Angiography;  Surgeon: Raymond Fell, MD;  Location: [facility] INVASIVE CV LAB;  Service: Cardiovascular;  Laterality: N/A;   INGUINAL HERNIA REPAIR Right 05/01/2023   Procedure: OPEN RIGHT INGUINAL HERNIA REPAIR WITH MESH;  Surgeon: Raymond Mitzie LABOR, MD;  Location: [facility] ORS;  Service: General;  Laterality: Right;   PERCUTANEOUS CORONARY STENT INTERVENTION (PCI-S)  01/2015   LEFT CIRCUMFLEX  1ST & 2ND   OM BRANCHES    Allergies  No Known Allergies  Home Medications    Prior to Admission medications   Medication Sig Start Date End Date Taking? Authorizing Provider  amLODipine  (NORVASC ) 5 MG tablet TAKE 1 TABLET BY MOUTH EVERY DAY 02/17/24   Raymond Lamarr HERO, NP  aspirin  EC 81 MG tablet Take 81 mg by mouth daily. Swallow whole.    [provider]  atorvastatin  (LIPITOR) 80 MG tablet TAKE 1 TABLET BY MOUTH DAILY 03/10/24   Norris Raymond RAMAN, MD  cetirizine  (ZYRTEC ) 10 MG tablet TAKE 1 TABLET(10 MG) BY MOUTH DAILY 05/18/24   Raymond Norris, Raymond GRADE, MD  fluticasone  (FLONASE ) 50 MCG/ACT nasal spray PLACE 2 SPRAYS INTO EACH NOSTRIL ONCE DAILY FOR 1 MONTH THEN USE 1 SPRAY IN EACH NOSTRIL EVERY DAY 09/02/23   Raymond Norris, Raymond GRADE, MD  nitroGLYCERIN  (NITROSTAT ) 0.4 MG SL tablet Place 1 tablet (0.4 mg total) under the tongue every 5 (five) minutes x 3 doses as needed for chest pain. 12/25/23   Raymond  Delma Raymond GRADE, MD    Physical Exam    Vital Signs:  Raymond Norris does not have vital signs available for review today.  Given telephonic nature of communication, physical exam is limited. AAOx3. NAD. Normal affect.  Speech and respirations are unlabored.  Accessory Clinical Findings    None  Assessment & Plan    1.  Preoperative Cardiovascular Risk Assessment:  According to the Revised Cardiac Risk Index (RCRI), his Perioperative Risk of Major Cardiac Event is (%): 0.9.  His Functional Capacity in METs is: 5.72 according to the Duke Activity Status Index (DASI). Therefore, based on ACC/AHA guidelines, patient would be at acceptable risk for the planned procedure without further cardiovascular testing.  The patient was advised that if he develops new symptoms prior to surgery to contact our office to arrange for a follow-up visit, and he verbalized understanding.  Regarding ASA therapy, we recommend continuation of ASA throughout the perioperative period.  However, if the surgeon feels that cessation of ASA is required in the perioperative period, it may be stopped 5-7 days prior to surgery with a plan to resume it as soon as felt to be feasible from a surgical standpoint in the post-operative period.   A copy of this note will be routed to requesting surgeon.  Time:   Today, I have spent 5 minutes with the patient with telehealth technology discussing medical history, symptoms, and management plan.     Raymond JAYSON Braver, NP  07/29/2024, 12:43 PM

## 2024-07-30 NOTE — Telephone Encounter (Signed)
 Please see notes from Cardiology from virtual visit on 12-3.  Patient is cleared to procedure with endoscopic procedure (with Dr. Abran on 08-28-24).  Patient should continue on 81 mg aspirin  if Ok with GI.

## 2024-07-31 NOTE — Telephone Encounter (Signed)
 Patient should continue Aspirin . We do not stop Aspirin . THX.

## 2024-08-03 NOTE — Telephone Encounter (Signed)
 Called and spoke to patient.  He understands he is cleared for procedure and can continue Aspirin .  He understands to arrive by 12:30pm on 1-2

## 2024-08-11 ENCOUNTER — Other Ambulatory Visit: Payer: Self-pay | Admitting: Adult Health

## 2024-08-25 ENCOUNTER — Telehealth: Payer: Self-pay | Admitting: Internal Medicine

## 2024-08-25 NOTE — Telephone Encounter (Signed)
 Spoke with pt- he states he is sick and is seeing his PCP in them am to test for flu.  Rescheduled colonoscopy to 09-28-24 at 800 and PV for 09-21-24 at 1000

## 2024-08-25 NOTE — Telephone Encounter (Signed)
 Spoke w pt about upcoming colonoscopy. Pt stated he has PCP visit tomorrow for cold/flu symptoms. Pt expressed concern about completing procedure with cough, requesting call to discuss. Please advise, thank you!

## 2024-08-25 NOTE — Telephone Encounter (Signed)
 Patient calling in regards to previous note would like to speak to nurse  Please advise  Thank you

## 2024-08-26 ENCOUNTER — Encounter: Payer: Self-pay | Admitting: Family Medicine

## 2024-08-26 ENCOUNTER — Ambulatory Visit: Admitting: Family Medicine

## 2024-08-26 ENCOUNTER — Ambulatory Visit (INDEPENDENT_AMBULATORY_CARE_PROVIDER_SITE_OTHER): Admitting: Family Medicine

## 2024-08-26 VITALS — BP 112/70 | HR 74 | Temp 98.3°F | Ht 68.0 in | Wt 209.0 lb

## 2024-08-26 DIAGNOSIS — R0981 Nasal congestion: Secondary | ICD-10-CM | POA: Diagnosis not present

## 2024-08-26 DIAGNOSIS — J069 Acute upper respiratory infection, unspecified: Secondary | ICD-10-CM

## 2024-08-26 LAB — POC COVID19 BINAXNOW: SARS Coronavirus 2 Ag: NEGATIVE

## 2024-08-26 LAB — POCT INFLUENZA A/B
Influenza A, POC: NEGATIVE
Influenza B, POC: NEGATIVE

## 2024-08-26 MED ORDER — AZITHROMYCIN 250 MG PO TABS: ORAL_TABLET | ORAL | 0 refills | Status: AC

## 2024-08-26 NOTE — Progress Notes (Signed)
 "  Acute Office Visit   Subjective:  Patient ID: Raymond Norris, male    DOB: Dec 28, 1950, 73 y.o.   MRN: [id number]  Chief Complaint  Patient presents with   Cough   Nasal Congestion   HPI: Patient is being seen for the following symptoms:   Productive cough-clear, yellow  Nasal drainage  Denies headaches, ear pain/pressure, sore throat, chest pain, shortness of breath, fever, loss of appetite, body aches, nausea, vomiting, or diarrhea.    Cough started about 3 weeks ago and nasal drainage started last week.   He has been taking his Cetirizine , Flonase , and Halls cough drop.  He was suppose to have a colonoscopy on Friday, but has canceled due to his symptoms.  Review of Systems  Respiratory:  Positive for cough.    See HPI above      Objective:   BP 112/70   Pulse 74   Temp 98.3 F (36.8 C) (Oral)   Ht 5' 8 (1.727 m)   Wt 209 lb (94.8 kg)   SpO2 95%   BMI 31.78 kg/m    Physical Exam Vitals reviewed.  Constitutional:      General: He is not in acute distress.    Appearance: Normal appearance. He is obese. He is not ill-appearing, toxic-appearing or diaphoretic.  HENT:     Head: Normocephalic and atraumatic.     Right Ear: Tympanic membrane, ear canal and external ear normal. There is no impacted cerumen.     Left Ear: Tympanic membrane, ear canal and external ear normal. There is no impacted cerumen.     Nose:     Right Sinus: No maxillary sinus tenderness or frontal sinus tenderness.     Left Sinus: No maxillary sinus tenderness or frontal sinus tenderness.     Mouth/Throat:     Pharynx: Oropharynx is clear. Uvula midline. Posterior oropharyngeal erythema (Mild) present. No pharyngeal swelling or oropharyngeal exudate.  Eyes:     General:        Right eye: No discharge.        Left eye: No discharge.     Conjunctiva/sclera: Conjunctivae normal.  Cardiovascular:     Rate and Rhythm: Normal rate and regular rhythm.     Heart sounds: Normal heart sounds. No  murmur heard.    No friction rub. No gallop.  Pulmonary:     Effort: Pulmonary effort is normal. No respiratory distress.     Breath sounds: Normal breath sounds.  Musculoskeletal:        General: Normal range of motion.  Lymphadenopathy:     Head:     Right side of head: No submental or submandibular adenopathy.     Left side of head: No submental or submandibular adenopathy.  Skin:    General: Skin is warm and dry.  Neurological:     General: No focal deficit present.     Mental Status: He is alert and oriented to person, place, and time. Mental status is at baseline.  Psychiatric:        Mood and Affect: Mood normal.        Behavior: Behavior normal.        Thought Content: Thought content normal.        Judgment: Judgment normal.    Results for orders placed or performed in visit on 08/26/24  POC COVID-19  Result Value Ref Range   SARS Coronavirus 2 Ag Negative Negative  POC Influenza A/B  Result Value Ref Range  Influenza A, POC Negative Negative   Influenza B, POC Negative Negative       Assessment & Plan:  Nasal congestion -     POC COVID-19 BinaxNow -     POCT Influenza A/B  Upper respiratory tract infection, unspecified type -     Azithromycin ; Take 2 tablets on day 1, then 1 tablet daily on days 2 through 5  Dispense: 6 tablet; Refill: 0  -Rapid negative for covid and influenza. -Prescribed Azithromycin  250mg  tablet, take 2 tablets today, 1 tablet 2-5 day. -Continue taking Cetirizine , Flonase , and Halls cough drops. May also take over the counter Mucinex to help with cough.  -Rest, stay hydrated. -Follow up if not improved.   Asaph Serena, NP "

## 2024-08-26 NOTE — Patient Instructions (Addendum)
-  It was a pleasure to care for you today. -Rapid negative for covid and influenza. -Prescribed Azithromycin  250mg  tablet, take 2 tablets today, 1 tablet 2-5 day. -Continue taking Cetirizine , Flonase , and Halls cough drops. May also take over the counter Mucinex to help with cough.  -Rest, stay hydrated. -Follow up if not improved.

## 2024-08-28 ENCOUNTER — Encounter: Admitting: Internal Medicine

## 2024-08-31 ENCOUNTER — Other Ambulatory Visit: Payer: Self-pay | Admitting: Internal Medicine

## 2024-08-31 DIAGNOSIS — R0982 Postnasal drip: Secondary | ICD-10-CM

## 2024-09-04 ENCOUNTER — Ambulatory Visit: Admitting: Family Medicine

## 2024-09-04 ENCOUNTER — Encounter: Payer: Self-pay | Admitting: Family Medicine

## 2024-09-04 VITALS — BP 122/84 | HR 78 | Temp 97.2°F | Resp 16 | Ht 68.0 in | Wt 208.8 lb

## 2024-09-04 DIAGNOSIS — R5383 Other fatigue: Secondary | ICD-10-CM

## 2024-09-04 NOTE — Progress Notes (Unsigned)
 "  ACUTE VISIT Chief Complaint  Patient presents with   Acute Visit    Patient was seen 08/26/24 last wed - patient still doesn't have any energy and feels cold a lot.    Discussed the use of AI scribe software for clinical note transcription with the patient, who gave verbal consent to proceed. History of Present Illness Raymond Norris is a 74 year old male with past medical history significant for hypertension, CAD, GERD, and dyslipidemia who presents with fatigue as described above.  He has been experiencing fatigue and tiredness for the past three to four days, occurring intermittently. This follows a recent acute illness for which he was treated by his PCP, seen on 08/31/2024.He completed abx treatment, azithromycin .  He denies fever, chills sore throat, chest pain, difficulty breathing, or palpitations. His appetite is good, and he denies abdominal pain, blood in the stool, melena, or changes in urination.   He mentions that his sleep pattern has been affected by his previous job as a surveyor, mining, leading to more daytime sleep and about four hours of sleep at night.  When inquiring about abnormal wt loss, he reports a weight of 208 pounds, down from 213 pounds in November, which he attributes to changes in eating habits.  Lab Results  Component Value Date   TSH 2.82 10/30/2018   Lab Results  Component Value Date   NA 140 01/02/2024   CL 113 (H) 01/02/2024   K 3.9 01/02/2024   CO2 19 (L) 01/02/2024   BUN 17 01/02/2024   CREATININE 1.25 (H) 01/02/2024   GFRNONAA >60 01/02/2024   CALCIUM  8.3 (L) 01/02/2024   ALBUMIN 4.2 03/12/2023   GLUCOSE 97 01/02/2024   Lab Results  Component Value Date   WBC 9.0 01/02/2024   HGB 15.0 01/02/2024   HCT 47.9 01/02/2024   MCV 99.8 01/02/2024   PLT 354 01/02/2024   Review of Systems  HENT:  Positive for postnasal drip. Negative for sore throat.   Respiratory:  Negative for cough and wheezing.   Cardiovascular:  Negative for  chest pain and leg swelling.  Gastrointestinal:  Negative for nausea and vomiting.  Endocrine: Negative for cold intolerance and heat intolerance.  Genitourinary:  Negative for decreased urine volume, dysuria and hematuria.  Skin:  Negative for rash.  Neurological:  Negative for syncope, weakness and headaches.  See other pertinent positives and negatives in HPI.  Medications Ordered Prior to Encounter[1]  Past Medical History:  Diagnosis Date   Coronary artery disease    GERD 04/26/2008   Myocardial infarction [facility])    Allergies[2]  Social History   Socioeconomic History   Marital status: Married    Spouse name: Not on file   Number of children: Not on file   Years of education: Not on file   Highest education level: Not on file  Occupational History   Occupation: retired  Tobacco Use   Smoking status: Never   Smokeless tobacco: Never  Vaping Use   Vaping status: Never Used  Substance and Sexual Activity   Alcohol use: No   Drug use: No   Sexual activity: Not on file  Other Topics Concern   Not on file  Social History Narrative   Not on file   Social Drivers of Health   Tobacco Use: Low Risk (09/04/2024)   Patient History    Smoking Tobacco Use: Never    Smokeless Tobacco Use: Never    Passive Exposure: Not on file  Financial Resource Strain: Not on file  Food Insecurity: Not on file  Transportation Needs: Not on file  Physical Activity: Not on file  Stress: Not on file  Social Connections: Not on file  Depression (PHQ2-9): Low Risk (08/26/2024)   Depression (PHQ2-9)    PHQ-2 Score: 0  Alcohol Screen: Not on file  Housing: Not on file  Utilities: Not on file  Health Literacy: Not on file    Vitals:   09/04/24 1438  BP: 122/84  Pulse: 78  Resp: 16  Temp: (!) 97.2 F (36.2 C)  SpO2: 94%   Wt Readings from Last 3 Encounters:  09/04/24 208 lb 12.8 oz (94.7 kg)  08/26/24 209 lb (94.8 kg)  07/09/24 213 lb (96.6 kg)   Body mass index is 31.75  kg/m.  Physical Exam Vitals and nursing note reviewed.  Constitutional:      General: He is not in acute distress.    Appearance: He is well-developed.  HENT:     Head: Normocephalic and atraumatic.     Mouth/Throat:     Mouth: Mucous membranes are moist.  Eyes:     Conjunctiva/sclera: Conjunctivae normal.  Cardiovascular:     Rate and Rhythm: Normal rate and regular rhythm.     Heart sounds: No murmur heard. Pulmonary:     Effort: Pulmonary effort is normal. No respiratory distress.     Breath sounds: Normal breath sounds.  Abdominal:     Palpations: Abdomen is soft. There is no mass.     Tenderness: There is no abdominal tenderness.  Lymphadenopathy:     Cervical: No cervical adenopathy.  Skin:    General: Skin is warm.     Findings: No erythema or rash.  Neurological:     Mental Status: He is alert and oriented to person, place, and time.     Cranial Nerves: No cranial nerve deficit.     Gait: Gait normal.  Psychiatric:        Mood and Affect: Mood and affect normal.   ASSESSMENT AND PLAN:  [name] was seen today for acute visit.  Diagnoses and all orders for this visit:  Other fatigue Today c/o fatigue following an acute respiratory illness, for which he completed abx treatment.  We discussed possible etiologies of fatigue. Hx and examination today do not suggest a serious process. Most likely post viral related. Explained that it may take some time for his energy level to go back to his baseline. We discussed options at this time, including blood work. He prefers to hold on blood work at this time, ut least until he has his colonoscopy, which has been scheduled for next months. Monitor for new symptoms. Instructed about warning signs. He will follow with PCP if symptoms are persistent or new problems present.  Return if symptoms worsen or fail to improve.  Pier Laux G. Catherin Doorn, MD  [facility]. [place] office.     [1]  Current  Outpatient Medications on File Prior to Visit  Medication Sig Dispense Refill   amLODipine  (NORVASC ) 5 MG tablet TAKE 1 TABLET BY MOUTH EVERY DAY 30 tablet 11   aspirin  EC 81 MG tablet Take 81 mg by mouth daily. Swallow whole.     atorvastatin  (LIPITOR) 80 MG tablet TAKE 1 TABLET BY MOUTH DAILY 90 tablet 3   cetirizine  (ZYRTEC ) 10 MG tablet TAKE 1 TABLET(10 MG) BY MOUTH DAILY 90 tablet 0   fluticasone  (FLONASE ) 50 MCG/ACT nasal spray PLACE 2 SPRAYS INTO [facility]  NOSTRIL ONCE DAILY FOR 1 MONTH THEN USE 1 SPRAY IN EACH NOSTRIL EVERY DAY 16 g 1   nitroGLYCERIN  (NITROSTAT ) 0.4 MG SL tablet Place 1 tablet (0.4 mg total) under the tongue every 5 (five) minutes x 3 doses as needed for chest pain. 25 tablet 12   No current facility-administered medications on file prior to visit.  [2] No Known Allergies  "

## 2024-09-04 NOTE — Patient Instructions (Signed)
 A few things to remember from today's visit:  Other fatigue This could be related to recent illness. As requested, you can hold on blood work until you have your colonoscopy. Monitor for new symptoms. Continue adequate hydration. Follow-up with Dr. Theophilus if a new problem presents of his symptoms do not resolve.  Do not use My Chart to request refills or for acute issues that need immediate attention. If you send a my chart message, it may take a few days to be addressed, specially if I am not in the office.  Please be sure medication list is accurate. If a new problem present, please set up appointment sooner than planned today.

## 2024-09-18 ENCOUNTER — Telehealth: Payer: Self-pay

## 2024-09-18 NOTE — Telephone Encounter (Signed)
 RN attempted to contact patient about [facility] closure d/t weather on 09/21/24. RN left vm asking patient to return call to reschedule PV.

## 2024-09-21 ENCOUNTER — Encounter

## 2024-09-23 ENCOUNTER — Telehealth: Payer: Self-pay | Admitting: *Deleted

## 2024-09-23 ENCOUNTER — Ambulatory Visit

## 2024-09-23 VITALS — Ht 68.0 in | Wt 213.8 lb

## 2024-09-23 DIAGNOSIS — Z860100 Personal history of colon polyps, unspecified: Secondary | ICD-10-CM

## 2024-09-23 DIAGNOSIS — R1311 Dysphagia, oral phase: Secondary | ICD-10-CM

## 2024-09-23 NOTE — Progress Notes (Signed)
 PCP MD at time of PV: Theophilus, MD __________________________________________________________________________________________________________________________________________  No egg allergy known to patient  No soy allergy known to patient No issues known to pt with past sedation with any surgeries or procedures Patient denies ever being told they had issues or difficulty with intubation  No FH of Malignant Hyperthermia Pt is not on diet pills Pt is not on  home 02  Pt is not on blood thinners  No A fib or A flutter Have any cardiac testing pending-- no  LOA: independent  No Chew or Snuff tobacco __________________________________________________________________________________________________________________________________________  Constipation:  no  Prep: suprep  __________________________________________________________________________________________________________________________________________  PV completed with patient. Prep instructions reviewed and provided during apt. Rx sent to preferred pharmacy.  __________________________________________________________________________________________________________________________________________

## 2024-09-23 NOTE — Telephone Encounter (Signed)
 Raymond Norris is in the forecast this weekend, so well be checking in with patients this weekend, about their endoscopy appointments for Monday, Feb 2nd. Depending on road conditions, we may need to adjust arrival times or possibly reschedule procedures.  Please let us  know via your MyChart app if you want to reschedule your Monday procedure ahead of this decision. We ask that you keep in mind you may receive a phone call or MyChart message from us  this weekend and reply as soon as you can via your MyChart app.  Thank you, we appreciate your flexibility.   Attempted to call pt to discuss above message. No answer. Left detailed message.

## 2024-09-28 ENCOUNTER — Encounter: Admitting: Internal Medicine

## 2024-09-30 ENCOUNTER — Encounter: Payer: Self-pay | Admitting: Internal Medicine

## 2024-09-30 ENCOUNTER — Ambulatory Visit: Admitting: Internal Medicine

## 2024-09-30 VITALS — BP 110/80 | HR 76 | Temp 98.4°F | Wt 212.5 lb

## 2024-09-30 DIAGNOSIS — J069 Acute upper respiratory infection, unspecified: Secondary | ICD-10-CM

## 2024-09-30 NOTE — Progress Notes (Signed)
 "    Established Patient Office Visit     CC/Reason for Visit: URI symptoms  HPI: Raymond Norris is a 74 y.o. male who is coming in today for the above mentioned reasons.  For the past 5 days has been experiencing rhinorrhea, nasal congestion, sore throat.  Positive household sick contact, no recent travel or fever.  He has developed a productive cough as well.  Will have some expectoration in the morning.  He describes it as light yellow in color.   Past Medical/Surgical History: Past Medical History:  Diagnosis Date   Coronary artery disease    GERD 04/26/2008   Myocardial infarction [facility])     Past Surgical History:  Procedure Laterality Date   CARDIAC CATHETERIZATION N/A 01/31/2015   Procedure: Left Heart Cath and Coronary Angiography;  Surgeon: Ozell Fell, MD;  Location: [facility] INVASIVE CV LAB;  Service: Cardiovascular;  Laterality: N/A;   INGUINAL HERNIA REPAIR Right 05/01/2023   Procedure: OPEN RIGHT INGUINAL HERNIA REPAIR WITH MESH;  Surgeon: Signe Mitzie LABOR, MD;  Location: [facility] ORS;  Service: General;  Laterality: Right;   PERCUTANEOUS CORONARY STENT INTERVENTION (PCI-S)  01/2015   LEFT CIRCUMFLEX  1ST & 2ND   OM BRANCHES    Social History:  reports that he has never smoked. He has never used smokeless tobacco. He reports that he does not drink alcohol and does not use drugs.  Allergies: Allergies[1]  Family History:  Family History  Problem Relation Age of Onset   Colon cancer Neg Hx    Rectal cancer Neg Hx    Stomach cancer Neg Hx     Current Medications[2]  Review of Systems:  Negative unless indicated in HPI.   Physical Exam: Vitals:   09/30/24 1537  BP: 110/80  Pulse: 76  Temp: 98.4 F (36.9 C)  TempSrc: Oral  SpO2: 96%  Weight: 212 lb 8 oz (96.4 kg)    Body mass index is 32.31 kg/m.   Physical Exam Vitals reviewed.  Constitutional:      Appearance: Normal appearance.  HENT:     Right Ear: Tympanic membrane, ear canal and external ear  normal.     Left Ear: Tympanic membrane, ear canal and external ear normal.     Mouth/Throat:     Mouth: Mucous membranes are moist.     Pharynx: Posterior oropharyngeal erythema present.  Eyes:     Conjunctiva/sclera: Conjunctivae normal.  Cardiovascular:     Rate and Rhythm: Normal rate and regular rhythm.  Pulmonary:     Effort: Pulmonary effort is normal.     Breath sounds: Normal breath sounds.  Neurological:     Mental Status: He is alert.      Impression and Plan:  Viral upper respiratory tract infection  -Given exam findings, PNA, pharyngitis, ear infection are not likely, hence abx have not been prescribed. -Have advised rest, fluids, OTC antihistamines, cough suppressants and mucinex. -RTC if no improvement in 10-14 days.    Time spent:22 minutes reviewing chart, interviewing and examining patient and formulating plan of care.     Tully Theophilus Andrews, MD [facility]     [1] No Known Allergies [2]  Current Outpatient Medications:    amLODipine  (NORVASC ) 5 MG tablet, TAKE 1 TABLET BY MOUTH EVERY DAY, Disp: 30 tablet, Rfl: 11   aspirin  EC 81 MG tablet, Take 81 mg by mouth daily. Swallow whole., Disp: , Rfl:    atorvastatin  (LIPITOR) 80 MG tablet, TAKE 1 TABLET  BY MOUTH DAILY, Disp: 90 tablet, Rfl: 3   cetirizine  (ZYRTEC ) 10 MG tablet, TAKE 1 TABLET(10 MG) BY MOUTH DAILY, Disp: 90 tablet, Rfl: 0   fluticasone  (FLONASE ) 50 MCG/ACT nasal spray, PLACE 2 SPRAYS INTO EACH NOSTRIL ONCE DAILY FOR 1 MONTH THEN USE 1 SPRAY IN EACH NOSTRIL EVERY DAY, Disp: 16 g, Rfl: 1   nitroGLYCERIN  (NITROSTAT ) 0.4 MG SL tablet, Place 1 tablet (0.4 mg total) under the tongue every 5 (five) minutes x 3 doses as needed for chest pain., Disp: 25 tablet, Rfl: 12  "

## 2024-10-08 ENCOUNTER — Encounter: Admitting: Internal Medicine
